# Patient Record
Sex: Male | Born: 1980 | Race: Black or African American | Hispanic: No | Marital: Single | Smoking: Current every day smoker
Health system: Southern US, Community
[De-identification: ages and names within clinical notes are randomized; demographics above are authoritative.]

## PROBLEM LIST (undated history)

## (undated) DIAGNOSIS — F1010 Alcohol abuse, uncomplicated: Secondary | ICD-10-CM

## (undated) DIAGNOSIS — Z720 Tobacco use: Secondary | ICD-10-CM

## (undated) DIAGNOSIS — I10 Essential (primary) hypertension: Secondary | ICD-10-CM

## (undated) HISTORY — DX: Tobacco use: Z72.0

## (undated) HISTORY — DX: Alcohol abuse, uncomplicated: F10.10

---

## 2004-08-10 ENCOUNTER — Ambulatory Visit: Payer: Self-pay | Admitting: Internal Medicine

## 2006-09-12 ENCOUNTER — Emergency Department (HOSPITAL_COMMUNITY): Admission: EM | Admit: 2006-09-12 | Discharge: 2006-09-13 | Payer: Self-pay | Admitting: Emergency Medicine

## 2007-05-09 ENCOUNTER — Emergency Department (HOSPITAL_COMMUNITY): Admission: EM | Admit: 2007-05-09 | Discharge: 2007-05-09 | Payer: Self-pay | Admitting: Family Medicine

## 2008-06-28 ENCOUNTER — Emergency Department (HOSPITAL_COMMUNITY): Admission: EM | Admit: 2008-06-28 | Discharge: 2008-06-28 | Payer: Self-pay | Admitting: Family Medicine

## 2009-01-05 ENCOUNTER — Emergency Department (HOSPITAL_COMMUNITY): Admission: EM | Admit: 2009-01-05 | Discharge: 2009-01-05 | Payer: Self-pay | Admitting: Family Medicine

## 2009-05-01 ENCOUNTER — Emergency Department (HOSPITAL_COMMUNITY): Admission: EM | Admit: 2009-05-01 | Discharge: 2009-05-01 | Payer: Self-pay | Admitting: Emergency Medicine

## 2011-03-20 LAB — CULTURE, ROUTINE-ABSCESS: Culture: NO GROWTH

## 2014-02-07 ENCOUNTER — Encounter (HOSPITAL_COMMUNITY): Payer: Self-pay | Admitting: Emergency Medicine

## 2014-02-07 ENCOUNTER — Emergency Department (HOSPITAL_COMMUNITY)
Admission: EM | Admit: 2014-02-07 | Discharge: 2014-02-07 | Disposition: A | Discharge status: Home / Self Care | Payer: Self-pay | Attending: Emergency Medicine | Admitting: Emergency Medicine

## 2014-02-07 ENCOUNTER — Emergency Department (HOSPITAL_COMMUNITY): Payer: Self-pay

## 2014-02-07 DIAGNOSIS — Y9389 Activity, other specified: Secondary | ICD-10-CM | POA: Insufficient documentation

## 2014-02-07 DIAGNOSIS — X503XXA Overexertion from repetitive movements, initial encounter: Secondary | ICD-10-CM | POA: Insufficient documentation

## 2014-02-07 DIAGNOSIS — S59919A Unspecified injury of unspecified forearm, initial encounter: Secondary | ICD-10-CM

## 2014-02-07 DIAGNOSIS — S63502A Unspecified sprain of left wrist, initial encounter: Secondary | ICD-10-CM

## 2014-02-07 DIAGNOSIS — F17200 Nicotine dependence, unspecified, uncomplicated: Secondary | ICD-10-CM | POA: Insufficient documentation

## 2014-02-07 DIAGNOSIS — I10 Essential (primary) hypertension: Secondary | ICD-10-CM | POA: Insufficient documentation

## 2014-02-07 DIAGNOSIS — S63509A Unspecified sprain of unspecified wrist, initial encounter: Secondary | ICD-10-CM | POA: Insufficient documentation

## 2014-02-07 DIAGNOSIS — S59909A Unspecified injury of unspecified elbow, initial encounter: Secondary | ICD-10-CM | POA: Insufficient documentation

## 2014-02-07 DIAGNOSIS — Y9289 Other specified places as the place of occurrence of the external cause: Secondary | ICD-10-CM | POA: Insufficient documentation

## 2014-02-07 DIAGNOSIS — S6990XA Unspecified injury of unspecified wrist, hand and finger(s), initial encounter: Secondary | ICD-10-CM

## 2014-02-07 HISTORY — DX: Essential (primary) hypertension: I10

## 2014-02-07 MED ORDER — IBUPROFEN 800 MG PO TABS: 800.0000 mg | ORAL_TABLET | Freq: Three times a day (TID) | ORAL | Status: DC

## 2014-02-07 MED ORDER — IBUPROFEN 400 MG PO TABS
800.0000 mg | ORAL_TABLET | Freq: Once | ORAL | Status: AC
  Administered 2014-02-07: 800 mg via ORAL
  Filled 2014-02-07: qty 2

## 2014-02-07 NOTE — ED Provider Notes (Signed)
CSN: [id number]     Arrival date & time 02/07/14  1249 History  This chart was scribed for Johnnette Gourd, PA-C, working with Vanetta Mulders, MD by Chestine Spore, ED Scribe. The patient was seen in room TR08C/TR08C at 1:35 PM.   Chief Complaint  Patient presents with  . Wrist Pain      The history is provided by the patient. No language interpreter was used.   HPI Comments: Edwin [name] is a 33 y.o. male with a medical hx of HTN who presents to the Emergency Department complaining of wrist pain onset yesterday. He rates the pain as a 6-7/10. He was working in the yard and lifting a trash-can when the pain came. He does not think that he heard or felt a pop in the wrist. He is having associated symptoms of joint swelling. He has not tried taking any medications for his symptoms. He denies any other associated symptoms. He voices concern for work since his job requires him to do a lot of lifting.   Past Medical History  Diagnosis Date  . Hypertension    History reviewed. No pertinent past surgical history. History reviewed. No pertinent family history. History  Substance Use Topics  . Smoking status: Current Every Day Smoker -- 1.00 packs/day  . Smokeless tobacco: Never Used  . Alcohol Use: Yes     Comment: 2 40oz every day    Review of Systems  Musculoskeletal: Positive for arthralgias (left wrist pain) and joint swelling (left wrist).    A complete 10 system review of systems was obtained and all systems are negative except as noted in the HPI and PMH.    Allergies  Review of patient's allergies indicates no known allergies.  Home Medications   Prior to Admission medications   Medication Sig Start Date End Date Taking? Authorizing Provider  ibuprofen (ADVIL,MOTRIN) 800 MG tablet Take 1 tablet (800 mg total) by mouth 3 (three) times daily. 02/07/14   Trevor Mace, PA-C   BP 145/89  Pulse 81  Temp(Src) 98.6 F (37 C) (Oral)  Resp 15  SpO2 98%  Physical Exam   Nursing note and vitals reviewed. Constitutional: He is oriented to person, place, and time. He appears well-developed and well-nourished. No distress.  HENT:  Head: Normocephalic and atraumatic.  Eyes: Conjunctivae and EOM are normal.  Neck: Normal range of motion. Neck supple.  Cardiovascular: Normal rate, regular rhythm and normal heart sounds.   Pulses:      Radial pulses are 2+ on the left side.  Pulmonary/Chest: Effort normal and breath sounds normal.  Musculoskeletal: Normal range of motion. He exhibits tenderness. He exhibits no edema.  Tender to palpation over distal radius and ulna with mild swelling, no deformity. ROM limited due to pain  Neurological: He is alert and oriented to person, place, and time.  Skin: Skin is warm and dry.  Psychiatric: He has a normal mood and affect. His behavior is normal.    ED Course  Procedures (including critical care time)  SPLINT APPLICATION Date/Time: 3:29 PM Authorized by: Johnnette Gourd Consent: Verbal consent obtained. Risks and benefits: risks, benefits and alternatives were discussed Consent given by: patient Splint applied by: nurse Location details: left wrist Splint type: velcro Supplies used: velcro splint Post-procedure: The splinted body part was neurovascularly unchanged following the procedure. Patient tolerance: Patient tolerated the procedure well with no immediate complications.    DIAGNOSTIC STUDIES: Oxygen Saturation is 98% on room air, normal by my interpretation.  COORDINATION OF CARE: 1:37 PM-Discussed treatment plan which includes IBU and X-ray of the left wrist with pt at bedside and pt agreed to plan.   Labs Review Labs Reviewed - No data to display  Imaging Review Dg Wrist Complete Left  02/07/2014   CLINICAL DATA:  Left wrist pain following injury  EXAM: LEFT WRIST - COMPLETE 3+ VIEW  COMPARISON:  None.  FINDINGS: There is no evidence of fracture or dislocation. There is no evidence of  arthropathy or other focal bone abnormality. Soft tissues are unremarkable.  IMPRESSION: No acute abnormality noted.   Electronically Signed   By: Alcide Clever M.D.   On: 02/07/2014 15:21     EKG Interpretation None      MDM   Final diagnoses:  Left wrist sprain, initial encounter    Patient with right wrist pain after lifting a trash can. Vital signs stable. Neurovascularly intact. X-ray without any acute finding. Velcro wrist splint applied. Discussed RICE, NSAIDs. Stable for discharge. Return precautions given. Patient states understanding of treatment care plan and is agreeable.  I personally performed the services described in this documentation, which was scribed in my presence. The recorded information has been reviewed and is accurate.    Trevor Mace, PA-C 02/07/14 1530

## 2014-02-07 NOTE — ED Notes (Signed)
Ortho at bedside.

## 2014-02-07 NOTE — Progress Notes (Signed)
Orthopedic Tech Progress Note Patient Details:  Edwin [name] 12-19-1980 [id number]  Ortho Devices Type of Ortho Device: Velcro wrist splint Ortho Device/Splint Location: LUE Ortho Device/Splint Interventions: Application   Asia R Thompson 02/07/2014, 3:38 PM

## 2014-02-07 NOTE — Discharge Instructions (Signed)
Take ibuprofen as directed for pain.  Wrist Pain Wrist injuries are frequent in adults and children. A sprain is an injury to the ligaments that hold your bones together. A strain is an injury to muscle or muscle cord-like structures (tendons) from stretching or pulling. Generally, when wrists are moderately tender to touch following a fall or injury, a break in the bone (fracture) may be present. Most wrist sprains or strains are better in 3 to 5 days, but complete healing may take several weeks. HOME CARE INSTRUCTIONS   Put ice on the injured area.  Put ice in a plastic bag.  Place a towel between your skin and the bag.  Leave the ice on for 15-20 minutes, 3-4 times a day, for the first 2 days, or as directed by your health care provider.  Keep your arm raised above the level of your heart whenever possible to reduce swelling and pain.  Rest the injured area for at least 48 hours or as directed by your health care provider.  If a splint or elastic bandage has been applied, use it for as long as directed by your health care provider or until seen by a health care provider for a follow-up exam.  Only take over-the-counter or prescription medicines for pain, discomfort, or fever as directed by your health care provider.  Keep all follow-up appointments. You may need to follow up with a specialist or have follow-up X-rays. Improvement in pain level is not a guarantee that you did not fracture a bone in your wrist. The only way to determine whether or not you have a broken bone is by X-ray. SEEK IMMEDIATE MEDICAL CARE IF:   Your fingers are swollen, very red, white, or cold and blue.  Your fingers are numb or tingling.  You have increasing pain.  You have difficulty moving your fingers. MAKE SURE YOU:   Understand these instructions.  Will watch your condition.  Will get help right away if you are not doing well or get worse. Document Released: 03/07/2005 Document Revised:  06/02/2013 Document Reviewed: 07/19/2010 [facility] Patient Information 2015 [place], [place]. This information is not intended to replace advice given to you by your health care provider. Make sure you discuss any questions you have with your health care provider.  Sprain A sprain is a tear in one of the strong, fibrous tissues that connect your bones (ligaments). The severity of the sprain depends on how much of the ligament is torn. The tear can be either partial or complete. CAUSES  Often, sprains are a result of a fall or an injury. The force of the impact causes the fibers of your ligament to stretch beyond their normal length. This excess tension causes the fibers of your ligament to tear. SYMPTOMS  You may have some loss of motion or increased pain within your normal range of motion. Other symptoms include:  Bruising.  Tenderness.  Swelling. DIAGNOSIS  In order to diagnose a sprain, your caregiver will physically examine you to determine how torn the ligament is. Your caregiver may also suggest an X-ray exam to make sure no bones are broken. TREATMENT  If your ligament is only partially torn, treatment usually involves keeping the injured area in a fixed position (immobilization) for a short period. To do this, your caregiver will apply a bandage, cast, or splint to keep the area from moving until it heals. For a partially torn ligament, the healing process usually takes 2 to 3 weeks. If your ligament is  completely torn, you may need surgery to reconnect the ligament to the bone or to reconstruct the ligament. After surgery, a cast or splint may be applied and will need to stay on for 4 to 6 weeks while your ligament heals. HOME CARE INSTRUCTIONS  Keep the injured area elevated to decrease swelling.  To ease pain and swelling, apply ice to your joint twice a day, for 2 to 3 days.  Put ice in a plastic bag.  Place a towel between your skin and the bag.  Leave the ice on for 15  minutes.  Only take over-the-counter or prescription medicine for pain as directed by your caregiver.  Do not leave the injured area unprotected until pain and stiffness go away (usually 3 to 4 weeks).  Do not allow your cast or splint to get wet. Cover your cast or splint with a plastic bag when you shower or bathe. Do not swim.  Your caregiver may suggest exercises for you to do during your recovery to prevent or limit permanent stiffness. SEEK IMMEDIATE MEDICAL CARE IF:  Your cast or splint becomes damaged.  Your pain becomes worse. MAKE SURE YOU:  Understand these instructions.  Will watch your condition.  Will get help right away if you are not doing well or get worse. Document Released: 05/25/2000 Document Revised: 08/20/2011 Document Reviewed: 06/09/2011 [facility] Patient Information 2015 [place], [place]. This information is not intended to replace advice given to you by your health care provider. Make sure you discuss any questions you have with your health care provider. RICE: Routine Care for Injuries The routine care of many injuries includes Rest, Ice, Compression, and Elevation (RICE). HOME CARE INSTRUCTIONS  Rest is needed to allow your body to heal. Routine activities can usually be resumed when comfortable. Injured tendons and bones can take up to 6 weeks to heal. Tendons are the cord-like structures that attach muscle to bone.  Ice following an injury helps keep the swelling down and reduces pain.  Put ice in a plastic bag.  Place a towel between your skin and the bag.  Leave the ice on for 15-20 minutes, 3-4 times a day, or as directed by your health care provider. Do this while awake, for the first 24 to 48 hours. After that, continue as directed by your caregiver.  Compression helps keep swelling down. It also gives support and helps with discomfort. If an elastic bandage has been applied, it should be removed and reapplied every 3 to 4 hours. It should not be  applied tightly, but firmly enough to keep swelling down. Watch fingers or toes for swelling, bluish discoloration, coldness, numbness, or excessive pain. If any of these problems occur, remove the bandage and reapply loosely. Contact your caregiver if these problems continue.  Elevation helps reduce swelling and decreases pain. With extremities, such as the arms, hands, legs, and feet, the injured area should be placed near or above the level of the heart, if possible. SEEK IMMEDIATE MEDICAL CARE IF:  You have persistent pain and swelling.  You develop redness, numbness, or unexpected weakness.  Your symptoms are getting worse rather than improving after several days. These symptoms may indicate that further evaluation or further X-rays are needed. Sometimes, X-rays may not show a small broken bone (fracture) until 1 week or 10 days later. Make a follow-up appointment with your caregiver. Ask when your X-ray results will be ready. Make sure you get your X-ray results. Document Released: 09/09/2000 Document Revised: 06/02/2013 Document Reviewed:  10/27/2010 [employer] Patient Information 2015 [place], [place]. This information is not intended to replace advice given to you by your health care provider. Make sure you discuss any questions you have with your health care provider.

## 2014-02-07 NOTE — ED Notes (Signed)
Ortho contacted for splinting.

## 2014-02-07 NOTE — ED Notes (Signed)
Pt states he thinks that he sprained his wrist yesterday while working in the yard. Pt denies hearing or feeling a pop. Pulses, movement, and cap refill intact. No numbness and tingling. Pt worried about being able to work since job calls for lots of lifting.

## 2014-02-08 NOTE — ED Provider Notes (Signed)
Medical screening examination/treatment/procedure(s) were performed by non-physician practitioner and as supervising physician I was immediately available for consultation/collaboration.   EKG Interpretation None        Kaitelyn Jamison, MD 02/08/14 1125 

## 2014-09-17 ENCOUNTER — Emergency Department (HOSPITAL_COMMUNITY)
Admission: EM | Admit: 2014-09-17 | Discharge: 2014-09-17 | Disposition: A | Discharge status: Home / Self Care | Payer: 59 | Source: Home / Self Care | Attending: Emergency Medicine | Admitting: Emergency Medicine

## 2014-09-17 ENCOUNTER — Encounter (HOSPITAL_COMMUNITY): Payer: Self-pay | Admitting: Emergency Medicine

## 2014-09-17 DIAGNOSIS — I10 Essential (primary) hypertension: Secondary | ICD-10-CM

## 2014-09-17 DIAGNOSIS — J111 Influenza due to unidentified influenza virus with other respiratory manifestations: Secondary | ICD-10-CM

## 2014-09-17 MED ORDER — AMLODIPINE BESYLATE 5 MG PO TABS: 5.0000 mg | ORAL_TABLET | Freq: Every day | ORAL | Status: DC

## 2014-09-17 MED ORDER — OSELTAMIVIR PHOSPHATE 75 MG PO CAPS: 75.0000 mg | ORAL_CAPSULE | Freq: Two times a day (BID) | ORAL | Status: DC

## 2014-09-17 NOTE — Discharge Instructions (Signed)
You have the flu. Drink plenty of fluids. Take Tylenol or ibuprofen as needed for fevers and body aches. Take Tamiflu 1 pill twice a day for the next 5 days.  Your blood pressure is quite elevated. I've given you a prescription for amlodipine. Take 1 pill daily. [facility] one [facility] Ideally, your blood pressure is less than 140/90.  Follow-up as needed.

## 2014-09-17 NOTE — ED Provider Notes (Signed)
CSN: [id number]     Arrival date & time 09/17/14  1543 History   First MD Initiated Contact with Patient 09/17/14 1730     Chief Complaint  Patient presents with  . Influenza   (Consider location/radiation/quality/duration/timing/severity/associated sxs/prior Treatment) HPI He is a 34 year old man here for evaluation of flulike symptoms. He states his symptoms started yesterday with generalized weakness, body aches, runny nose, sore throat, cough. His symptoms have worsened today. He does have a fever today. He reports some epigastric and sternal chest pain with coughing only. He denies any shortness of breath. No nausea or vomiting. He states his coworkers have been sick with the flu.  He states he does have high blood pressure, but has not taken any medication in 2-3 years. He used to be on, he thinks, lisinopril. He states it made him sick.  Past Medical History  Diagnosis Date  . Hypertension    History reviewed. No pertinent past surgical history. No family history on file. History  Substance Use Topics  . Smoking status: Current Every Day Smoker -- 1.00 packs/day  . Smokeless tobacco: Never Used  . Alcohol Use: Yes     Comment: 2 40oz every day    Review of Systems  Constitutional: Positive for fever and appetite change.  HENT: Positive for congestion, rhinorrhea and sore throat. Negative for ear pain and trouble swallowing.   Respiratory: Positive for cough. Negative for shortness of breath.   Cardiovascular: Positive for chest pain (with cough).  Gastrointestinal: Negative for nausea and vomiting.  Musculoskeletal: Positive for myalgias.  Neurological: Negative for headaches.    Allergies  Review of patient's allergies indicates no known allergies.  Home Medications   Prior to Admission medications   Medication Sig Start Date End Date Taking? Authorizing Provider  amLODipine (NORVASC) 5 MG tablet Take 1 tablet (5 mg total) by mouth daily. 09/17/14   Charm RingsErin J Kardell Virgil, MD   ibuprofen (ADVIL,MOTRIN) 800 MG tablet Take 1 tablet (800 mg total) by mouth 3 (three) times daily. 02/07/14   Kathrynn Speedobyn M Hess, PA-C  oseltamivir (TAMIFLU) 75 MG capsule Take 1 capsule (75 mg total) by mouth every 12 (twelve) hours. 09/17/14   Charm RingsErin J Filmore Molyneux, MD   BP 184/98 mmHg  Pulse 95  Temp(Src) 100.6 F (38.1 C) (Oral)  Resp 18  SpO2 96% Physical Exam  Constitutional: He is oriented to person, place, and time. He appears well-developed and well-nourished. No distress.  HENT:  Head: Normocephalic and atraumatic.  Right Ear: Tympanic membrane normal.  Left Ear: Tympanic membrane normal.  Nose: Rhinorrhea present.  Mouth/Throat: Oropharynx is clear and moist. No oropharyngeal exudate.  Eyes: Conjunctivae are normal.  Neck: Neck supple.  Cardiovascular: Normal rate, regular rhythm and normal heart sounds.   No murmur heard. Pulmonary/Chest: Effort normal and breath sounds normal. No respiratory distress. He has no wheezes. He has no rales. He exhibits tenderness (sternal).  Lymphadenopathy:    He has no cervical adenopathy.  Neurological: He is alert and oriented to person, place, and time.  Vitals reviewed.   ED Course  Procedures (including critical care time) Labs Review Labs Reviewed - No data to display  Imaging Review No results found.   MDM   1. Influenza   2. Essential hypertension    Tamiflu. Discussed fluids. Tylenol and Motrin as needed for fever and body aches.  Start amlodipine 5 mg daily for blood pressure. [facility]  Follow-up as needed.  Charm Rings, MD 09/17/14 [phone]

## 2014-09-17 NOTE — ED Notes (Signed)
Patient c/o flu-like symptoms including cough, fever, weakness and chills onset yesterday. States he took an OTC cough syrup. 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 Patient is in NAD.

## 2015-06-16 ENCOUNTER — Emergency Department (HOSPITAL_COMMUNITY): Payer: Self-pay

## 2015-06-16 ENCOUNTER — Emergency Department (HOSPITAL_COMMUNITY)
Admission: EM | Admit: 2015-06-16 | Discharge: 2015-06-16 | Disposition: A | Discharge status: Home / Self Care | Payer: Self-pay | Attending: Emergency Medicine | Admitting: Emergency Medicine

## 2015-06-16 ENCOUNTER — Encounter (HOSPITAL_COMMUNITY): Payer: Self-pay | Admitting: *Deleted

## 2015-06-16 DIAGNOSIS — I10 Essential (primary) hypertension: Secondary | ICD-10-CM | POA: Insufficient documentation

## 2015-06-16 DIAGNOSIS — F17200 Nicotine dependence, unspecified, uncomplicated: Secondary | ICD-10-CM | POA: Insufficient documentation

## 2015-06-16 DIAGNOSIS — K002 Abnormalities of size and form of teeth: Secondary | ICD-10-CM | POA: Insufficient documentation

## 2015-06-16 DIAGNOSIS — Z791 Long term (current) use of non-steroidal anti-inflammatories (NSAID): Secondary | ICD-10-CM | POA: Insufficient documentation

## 2015-06-16 DIAGNOSIS — K047 Periapical abscess without sinus: Secondary | ICD-10-CM | POA: Insufficient documentation

## 2015-06-16 DIAGNOSIS — R1012 Left upper quadrant pain: Secondary | ICD-10-CM | POA: Insufficient documentation

## 2015-06-16 DIAGNOSIS — R6884 Jaw pain: Secondary | ICD-10-CM | POA: Insufficient documentation

## 2015-06-16 DIAGNOSIS — Z79899 Other long term (current) drug therapy: Secondary | ICD-10-CM | POA: Insufficient documentation

## 2015-06-16 LAB — CBC
HCT: 38.2 % — ABNORMAL LOW (ref 39.0–52.0)
HEMOGLOBIN: 12.6 g/dL — AB (ref 13.0–17.0)
MCH: 31.3 pg (ref 26.0–34.0)
MCHC: 33 g/dL (ref 30.0–36.0)
MCV: 95 fL (ref 78.0–100.0)
PLATELETS: 212 10*3/uL (ref 150–400)
RBC: 4.02 MIL/uL — AB (ref 4.22–5.81)
RDW: 13.5 % (ref 11.5–15.5)
WBC: 12.4 10*3/uL — AB (ref 4.0–10.5)

## 2015-06-16 LAB — URINALYSIS, ROUTINE W REFLEX MICROSCOPIC
Bilirubin Urine: NEGATIVE
Glucose, UA: NEGATIVE mg/dL
HGB URINE DIPSTICK: NEGATIVE
Ketones, ur: 40 mg/dL — AB
LEUKOCYTES UA: NEGATIVE
NITRITE: NEGATIVE
PROTEIN: NEGATIVE mg/dL
SPECIFIC GRAVITY, URINE: 1.018 (ref 1.005–1.030)
pH: 6.5 (ref 5.0–8.0)

## 2015-06-16 LAB — COMPREHENSIVE METABOLIC PANEL
ALK PHOS: 38 U/L (ref 38–126)
ALT: 21 U/L (ref 17–63)
ANION GAP: 10 (ref 5–15)
AST: 19 U/L (ref 15–41)
Albumin: 4 g/dL (ref 3.5–5.0)
BUN: 7 mg/dL (ref 6–20)
CALCIUM: 9.1 mg/dL (ref 8.9–10.3)
CO2: 27 mmol/L (ref 22–32)
CREATININE: 0.64 mg/dL (ref 0.61–1.24)
Chloride: 104 mmol/L (ref 101–111)
Glucose, Bld: 90 mg/dL (ref 65–99)
Potassium: 3.8 mmol/L (ref 3.5–5.1)
SODIUM: 141 mmol/L (ref 135–145)
TOTAL PROTEIN: 6.7 g/dL (ref 6.5–8.1)
Total Bilirubin: 0.7 mg/dL (ref 0.3–1.2)

## 2015-06-16 LAB — LIPASE, BLOOD: Lipase: 27 U/L (ref 11–51)

## 2015-06-16 MED ORDER — IOHEXOL 300 MG/ML  SOLN
75.0000 mL | Freq: Once | INTRAMUSCULAR | Status: AC | PRN
  Administered 2015-06-16: 75 mL via INTRAVENOUS

## 2015-06-16 MED ORDER — GI COCKTAIL ~~LOC~~
30.0000 mL | Freq: Once | ORAL | Status: AC
  Administered 2015-06-16: 30 mL via ORAL
  Filled 2015-06-16: qty 30

## 2015-06-16 MED ORDER — ONDANSETRON 4 MG PO TBDP
4.0000 mg | ORAL_TABLET | Freq: Once | ORAL | Status: AC
  Administered 2015-06-16: 4 mg via ORAL
  Filled 2015-06-16: qty 1

## 2015-06-16 MED ORDER — PENICILLIN V POTASSIUM 500 MG PO TABS: 500.0000 mg | ORAL_TABLET | Freq: Four times a day (QID) | ORAL | Status: AC

## 2015-06-16 MED ORDER — OXYCODONE-ACETAMINOPHEN 5-325 MG PO TABS
1.0000 | ORAL_TABLET | Freq: Once | ORAL | Status: AC
  Administered 2015-06-16: 1 via ORAL
  Filled 2015-06-16: qty 1

## 2015-06-16 MED ORDER — ACETAMINOPHEN 325 MG PO TABS: 650.0000 mg | ORAL_TABLET | Freq: Four times a day (QID) | ORAL | Status: DC | PRN

## 2015-06-16 MED ORDER — DICYCLOMINE HCL 20 MG PO TABS: 20.0000 mg | ORAL_TABLET | Freq: Two times a day (BID) | ORAL | Status: DC | PRN

## 2015-06-16 NOTE — ED Notes (Signed)
Pt states LUQ abdominal pain for several weeks and L jaw pain that increases when he eats something solid. Lymph nodes swollen.  Dental carries noted. Pt states he drinks a fifth of whiskey and several 40's per day.  Denies nausea, vomiting.

## 2015-06-16 NOTE — Discharge Instructions (Signed)
You were seen in the emergency room today for evaluation of dental pain/jaw swelling and abdominal pain. Your abdominal labs and exam were normal. Your symptoms might be due to acid reflux or due to your alcohol intake. As we discussed, I highly suggest trying to cut back on your alcohol consumption. I will give you a list of resources to help you with this for when you are ready.  We also got a CT scan of your face given your dental pain. You do have evidence of a tooth infection but no deep abscess. I will give you a prescription for Penicillin, and antibiotic. Please take as prescribed and follow up with a dentist from the list below within one week. I also gave you a prescription for tylenol for your pain. You may take ibuprofen (Advil/Motrin) in addition to the Tylenol, but ibuprofen can make abdominal pain worse.   Emergency Department Resource Guide 1) Find a Doctor and Pay Out of Pocket Although you won't have to find out who is covered by your insurance plan, it is a good idea to ask around and get recommendations. You will then need to call the office and see if the doctor you have chosen will accept you as a new patient and what types of options they offer for patients who are self-pay. Some doctors offer discounts or will set up payment plans for their patients who do not have insurance, but you will need to ask so you aren't surprised when you get to your appointment.  2) Contact Your Local Health Department Not all health departments have doctors that can see patients for sick visits, but many do, so it is worth a call to see if yours does. If you don't know where your local health department is, you can check in your phone book. The CDC also has a tool to help you locate your state's health department, and many state websites also have listings of all of their local health departments.  3) Find a Walk-in Clinic If your illness is not likely to be very severe or complicated, you may want  to try a walk in clinic. These are popping up all over the country in pharmacies, drugstores, and shopping centers. They're usually staffed by nurse practitioners or physician assistants that have been trained to treat common illnesses and complaints. They're usually fairly quick and inexpensive. However, if you have serious medical issues or chronic medical problems, these are probably not your best option.  No Primary Care Doctor: - Call Health Connect at  [phone] - they can help you locate a primary care doctor that  accepts your insurance, provides certain services, etc. - Physician Referral Service- [phone]  Chronic Pain Problems: Organization         Address  Phone   Notes  [facility]  [phone] Patients need to be referred by their primary care doctor.   Medication Assistance: Organization         Address  Phone   Notes  [facility] Ptrssistance Program [address] [place], [place] [phone]) [phone] --Must be a resident of [place] -- Must have NO insurance coverage whatsoever (no Medicaid/ Medicare, etc.) -- The pt. MUST have a primary care doctor that directs their care regularly and follows them in the community   Med[facility]ist  [phone]   [employer]  [phone]    Agencies that provide inexpensive medical care: Organization  Address  Phone   Notes  [place]  [phone]   [facility]    [phone]   [facility] [place], [place] [address] [phone]   [place] [address] [place], [place] [phone]   Planned Parenthood    [phone]   [facility]    [phone]   [place] and [place] [address], [place] Phone:  [phone], Fax:  [phone] Hours of Operation:  9 am - 6 pm, M-F.  Also accepts Medicaid/Medicare and self-pay.   [facility] for [place], [address], [place] Phone: [phone], Fax: [phone]. Hours of Operation:  8:30 am - 5:30 pm, M-F.  Also accepts Medicaid and self-pay.  [facility] [place] [address] [place] Phone: [phone]   [place], [place], [place] [phone], Ext. [phone] Mondays & Thursdays: 7-9 AM.  First 15 patients are seen on a first come, first serve basis.    [place] Providers:  Organization         Address  Phone   Notes  [facility] [address], [place] [phone] Also accepts self-pay patients.  [facility] [address], [place]  [phone]   [place], [address], [place] [phone]   [facility] [address], [place] [phone]   Lucianne Lei [address], [place]   [phone] Only accepts [place] Access [place] patients after they have their name applied to their card.   Self-Pay (no insurance) in [facility]:  Organization         Address  Phone   Notes  Sickle Cell Patients, [facility] [place] [phone]   [facility] [place] [phone]   [facility] [place]  [place], [place], [place] [phone]   [facility]/Dr. Osei-Bonsu  [address] [place] or [address], [place] [phone] Phone number for both [place] and [place] locations is the same.  Urgent Medical and [facility] [address], [place] [phone]   [facility] [address], [place] or [address] [phone]   [facility] [address] [place] [phone], phone; [phone], fax Sees patients 1st and 3rd Saturday of every month.  Must not qualify for public or private insurance (i.e. Medicaid, Medicare, [place] Health Choice, Veterans' Benefits)  Household income should be no more than 200% of the poverty level The clinic cannot treat you if you are pregnant or think you are pregnant  Sexually transmitted diseases are not treated at the clinic.    Dental Care: Organization         Address  Phone  Notes  [facility] Department of [facility] [place] [phone] Accepts children up to age 52 who are enrolled in [place] or [place]; pregnant women with a Medicaid card; and children who have applied for Medicaid or [place] Health Choice, but were declined, whose parents can pay a reduced fee at time  of service.  [facility] Department of [facility]  [address], [place] [phone] Accepts children up to age 31 who are enrolled in [place] or [place]; pregnant women with a Medicaid card; and children who have applied for Medicaid or [place] Health Choice, but were declined, whose parents can pay a reduced fee at time of service.  [place] Adult Dental Access PROGRAM  [place] [phone] Patients are seen by appointment only. Walk-ins are not accepted. [place] will see patients 86 years of age and older. Monday - Tuesday (8am-5pm) Most Wednesdays (8:30-5pm) $30 per visit, cash only  [facility] Adult Dental Access PROGRAM  [address], [facility] [phone] Patients are seen by appointment only. Walk-ins are not accepted. [place] will see patients 19 years of age and older. One Wednesday Evening (Monthly: Volunteer Based).  $30 per visit, cash only  [place]  [phone] for adults; Children under age 25, call Graduate Pediatric Dentistry at [phone]. Children aged 61-14, please call [phone] to request a pediatric application.  Dental services are provided in all areas of dental care including fillings, crowns and bridges, complete and partial dentures, implants, gum treatment, root canals, and extractions. Preventive care is also provided. Treatment is provided to both adults and children. Patients are selected via a lottery and there is often a waiting list.   [facility] [address], [place]  [phone] [email or web address]   [facility] [address] [place], [place] [phone], Ext. [phone] Second and Fourth Thursday of each month, opens at 6:30 AM; Clinic ends at 9 AM.  Patients are seen on a first-come first-served basis, and a limited number are seen during each clinic.   [facility]  [address] Hillard [employer], [place] [phone]   Eligibility Requirements You must have lived in [place], [place], or [place] counties for at least the last three months.   You cannot be eligible for state or federal sponsored [employer], including [employer], [place], or [employer].   You generally cannot be eligible for healthcare insurance through your employer.    How to apply: Eligibility screenings are held every Tuesday and Wednesday afternoon from 1:00 pm until 4:00 pm. You do not need an appointment for the interview!  [facility] [address], [place], [place]   [place]  [place] Department  [place]  [phone]    Behavioral Health Resources in the Community: Intensive Outpatient Programs Organization         Address  Phone  Notes  [place] [place], [place] [phone]   [facility] Outpatient [address] [place], [place]   ADS: Alcohol & Drug Svcs [address], [place], [place]    [place] [address],  [place], [place] or [phone]   Substance Abuse Resources Organization         Address  Phone  Notes  Alcohol and Drug Services  [phone]   [place]  [phone]   [place]   Chinita Pester  [phone]   Residential & Outpatient Substance Abuse Program  [phone]   Psychological Services Organization         Address  Phone  Notes  [facility]  [place]  [phone]   [place] [address] [place] or [phone]    Mobile Crisis Teams Organization         Address  Phone  Notes  [facility], Mobile Crisis Care Unit  [phone]   [facility]  [address] [place], [place]   [place] Levels [address], [place] [phone]    Self-Help/Support Groups Organization         Address  Phone             Notes  [place]. of [place] - variety of support groups  [place] Call for more information  Narcotics Anonymous (NA), [facility] [address], [employer] [place]  2 meetings at this location   Special educational needs teacher         Address  Phone  Notes  ASAP Residential Treatment [employer],    [place]  [phone]   [facility]  [address] [place] [address]2863, [place], [place]   [place], [place] [phone] Admissions: 8am-3pm M-F  Incentives Substance [place] [address]    [place], [place] [phone]   [facility] [address] [place], [place], [place]   [facility] [address]  [place], [place]   [facility] Programs - Intensive Outpatient [address], [place], [place]   [facility] ([place].,  [place], [place] [phone] or [phone]   [facility] Services (RTS) [address] [place], [place] Accepts Medicaid  [facility] [place] [address]  [place] [phone] Substance Abuse/Addiction Treatment   [facility] Organization         Address  Phone  Notes  [facility]  [phone]   Domenic Schwab, PhD [address] Arlis Porta [place], [place]   [phone] or [phone]   Kinsley Carpenter [place], [place] [phone]   [facility] [address] [place], [place] [phone] Insurance/Medicaid/sponsorship through [facility] and Families [address], [address] [place] [address]                                    [place], [place] [phone] [place] [address], [place] [phone]    Dr. Adele Schilder  [phone]   [facility] Dept. 1) [address] [place] 2) [place] 3)  [place] [address], [place] [phone]  [phone]   [place] [phone] or [phone] (After Hours)

## 2015-06-16 NOTE — ED Notes (Signed)
Patient verbalized understanding of discharge instructions and denies any further needs or questions at this time. VS stable. Patient ambulatory with steady gait.  

## 2015-06-16 NOTE — ED Notes (Signed)
MD at bedside. 

## 2015-06-16 NOTE — ED Provider Notes (Signed)
CSN: [id number]     Arrival date & time 06/16/15  [id number] History   First MD Initiated Contact with Patient 06/16/15 1519     Chief Complaint  Patient presents with  . Abdominal Pain  . Dental Pain   HPI   Edwin [name] is an 35 y.o. male with history of uncontrolled HTN who presents to the ED for evaluation of abdominal pain and dental pain. He states his abdominal pain started 2 weeks ago. Describes it as 7/10 constant pain in one specific spot in his LUQ. Denies associated symptoms. Denies N/V/D. Denies fever at home. He has not tried anything to help with his symptoms. He states he is worried it is due to his drinking. He admits to drinking 1 pint to 1 fifth of liquor plus several 40 oz beers daily. Endorses biweekly marijuana use. Denies other drugs. Denies chest pain or SOB. Pt's BP elevated in the ED. He states he does not take his BP meds as they affect his sex life.   Pt is also complaining of lower left dental and jaw pain that began two days ago. He states he first started noticing pain with chewing on his lower left side two days ago. He has also since then noticed swelling of his left jaw. He states it hurts to open his mouth. He has not tried anything for the pain. Denies dysphagia, drooling, fever, chills.   Past Medical History  Diagnosis Date  . Hypertension    History reviewed. No pertinent past surgical history. No family history on file. Social History  Substance Use Topics  . Smoking status: Current Every Day Smoker -- 1.00 packs/day  . Smokeless tobacco: Never Used  . Alcohol Use: Yes     Comment: 2 40oz every day    Review of Systems  All other systems reviewed and are negative.     Allergies  Review of patient's allergies indicates no known allergies.  Home Medications   Prior to Admission medications   Medication Sig Start Date End Date Taking? Authorizing Provider  ibuprofen (ADVIL,MOTRIN) 800 MG tablet Take 1 tablet (800 mg total) by mouth 3 (three) times  daily. 02/07/14  Yes Robyn M Hess, PA-C  amLODipine (NORVASC) 5 MG tablet Take 1 tablet (5 mg total) by mouth daily. Patient not taking: Reported on 06/16/2015 09/17/14   Charm RingsErin J Honig, MD  oseltamivir (TAMIFLU) 75 MG capsule Take 1 capsule (75 mg total) by mouth every 12 (twelve) hours. 09/17/14   Charm RingsErin J Honig, MD   BP 156/102 mmHg  Pulse 70  Temp(Src) 98.5 F (36.9 C) (Oral)  Resp 18  Ht 5\' 11"  (1.803 m)  Wt 96.163 kg  BMI 29.58 kg/m2  SpO2 100% Physical Exam  Constitutional: He is oriented to person, place, and time.  HENT:  Right Ear: External ear normal.  Left Ear: External ear normal.  Nose: Nose normal.  Generally poor dentition. Last lower left molar with significant gingival edema and erythema. Gingiva and buccal soft tissue of lower left area markedly TTP. External lower left jaw line also TTP. Some limitation to ROM of jaw, either 2/2 guarding from pain or trimsus. No gross abscess.   Eyes: Conjunctivae and EOM are normal. Pupils are equal, round, and reactive to light.  Neck: Normal range of motion. Neck supple. No rigidity. No Kernig's sign noted.  +L submaxillar lymphadenopathy. TTP.   Cardiovascular: Normal rate, regular rhythm, normal heart sounds and intact distal pulses.   Pulmonary/Chest: Effort normal and breath  sounds normal. No respiratory distress. He has no wheezes. He exhibits no tenderness.  Abdominal: Soft. Bowel sounds are normal. He exhibits no distension. There is no tenderness. There is no rebound, no guarding and no CVA tenderness.  Musculoskeletal: He exhibits no edema.  Neurological: He is alert and oriented to person, place, and time. No cranial nerve deficit.  Skin: Skin is warm and dry.  Psychiatric: He has a normal mood and affect.  Nursing note and vitals reviewed.   ED Course  Procedures (including critical care time) Labs Review Labs Reviewed  CBC - Abnormal; Notable for the following:    WBC 12.4 (*)    RBC 4.02 (*)    Hemoglobin 12.6 (*)     HCT 38.2 (*)    All other components within normal limits  URINALYSIS, ROUTINE W REFLEX MICROSCOPIC (NOT AT [facility]) - Abnormal; Notable for the following:    APPearance CLOUDY (*)    Ketones, ur 40 (*)    All other components within normal limits  LIPASE, BLOOD  COMPREHENSIVE METABOLIC PANEL    Imaging Review Ct Maxillofacial W/cm  06/16/2015  CLINICAL DATA:  35 year old with acute onset of left facial swelling two days ago. EXAM: CT MAXILLOFACIAL WITH CONTRAST TECHNIQUE: Multidetector CT imaging of the maxillofacial structures was performed with intravenous contrast. Multiplanar CT image reconstructions were also generated. CONTRAST:  75mL OMNIPAQUE IOHEXOL 300 MG/ML IV. COMPARISON:  None. FINDINGS: Minimal edema/induration in the subcutaneous fat of the left cheek. No abnormal fluid collection to suggest abscess. Mildly enlarged left level Ib and level IIa lymph nodes, the largest a level Ib (submandibular) node measuring approximately 1.3 x 2.2 x 1.8 cm. Enlarged right level IIa node measuring approximately 1.9 x 1.2 x 2.8 cm. No masses identified within the visualized face and the upper neck. Normal and symmetric bilateral parotid and submandibular salivary glands. Multiple mucous retention cysts and/or polyps in both maxillary sinuses. Remaining paranasal sinuses well aerated. No air-fluid levels. Bony nasal septal deviation to the left. Temporomandibular joints intact. IMPRESSION: 1. Minimal edema/induration in the subcutaneous fat of the left cheek without evidence of abscess. 2. Mildly enlarged left level Ib and bilateral level IIa nodes. 3. Chronic bilateral maxillary sinusitis. Electronically Signed   By: Hulan Saas M.D.   On: 06/16/2015 18:43   I have personally reviewed and evaluated these images and lab results as part of my medical decision-making.   EKG Interpretation None      MDM   Final diagnoses:  Dental infection  Left upper quadrant pain   Spoke to attending Dr.  Clarene Duke who assess pt as well. Given pt's facial tenderness along entire lower left jawline (particularly extending anteriorly away from what appears to be the suspected infected tooth) will get CT maxillofacial to r/o underlying deep abscess or other abnormality.   CT shows some edema and induration of left cheek fat but no e/o abscess. Enlarged lymph nodes also visualized. Will give rx for PCN. Will give tylenol and bentyl for dental and abdominal pain. Resource guide given to establish dental and primary care. ER return precautions given.  Carlene Coria, PA-C 06/16/15 2355  Laurence Spates, MD 06/17/15 [phone]

## 2017-06-03 ENCOUNTER — Other Ambulatory Visit: Payer: Self-pay

## 2017-06-03 ENCOUNTER — Encounter (HOSPITAL_COMMUNITY): Payer: Self-pay

## 2017-06-03 ENCOUNTER — Emergency Department (HOSPITAL_COMMUNITY): Payer: PRIVATE HEALTH INSURANCE

## 2017-06-03 ENCOUNTER — Emergency Department (HOSPITAL_COMMUNITY)
Admission: EM | Admit: 2017-06-03 | Discharge: 2017-06-03 | Disposition: A | Discharge status: Home / Self Care | Payer: PRIVATE HEALTH INSURANCE | Attending: Emergency Medicine | Admitting: Emergency Medicine

## 2017-06-03 DIAGNOSIS — I10 Essential (primary) hypertension: Secondary | ICD-10-CM | POA: Insufficient documentation

## 2017-06-03 DIAGNOSIS — F17210 Nicotine dependence, cigarettes, uncomplicated: Secondary | ICD-10-CM | POA: Insufficient documentation

## 2017-06-03 DIAGNOSIS — R079 Chest pain, unspecified: Secondary | ICD-10-CM | POA: Diagnosis present

## 2017-06-03 DIAGNOSIS — Z79899 Other long term (current) drug therapy: Secondary | ICD-10-CM | POA: Insufficient documentation

## 2017-06-03 LAB — I-STAT TROPONIN, ED: TROPONIN I, POC: 0 ng/mL (ref 0.00–0.08)

## 2017-06-03 LAB — CBC
HCT: 40.8 % (ref 39.0–52.0)
Hemoglobin: 13.7 g/dL (ref 13.0–17.0)
MCH: 31.6 pg (ref 26.0–34.0)
MCHC: 33.6 g/dL (ref 30.0–36.0)
MCV: 94.2 fL (ref 78.0–100.0)
PLATELETS: 243 10*3/uL (ref 150–400)
RBC: 4.33 MIL/uL (ref 4.22–5.81)
RDW: 14 % (ref 11.5–15.5)
WBC: 12.9 10*3/uL — AB (ref 4.0–10.5)

## 2017-06-03 LAB — BASIC METABOLIC PANEL
ANION GAP: 10 (ref 5–15)
BUN: 12 mg/dL (ref 6–20)
CALCIUM: 9.4 mg/dL (ref 8.9–10.3)
CO2: 24 mmol/L (ref 22–32)
CREATININE: 0.53 mg/dL — AB (ref 0.61–1.24)
Chloride: 106 mmol/L (ref 101–111)
Glucose, Bld: 103 mg/dL — ABNORMAL HIGH (ref 65–99)
Potassium: 3.6 mmol/L (ref 3.5–5.1)
SODIUM: 140 mmol/L (ref 135–145)

## 2017-06-03 LAB — ETHANOL: Alcohol, Ethyl (B): 292 mg/dL — ABNORMAL HIGH (ref <10)

## 2017-06-03 MED ORDER — KETOROLAC TROMETHAMINE 30 MG/ML IJ SOLN
30.0000 mg | Freq: Once | INTRAMUSCULAR | Status: AC
  Administered 2017-06-03: 30 mg via INTRAVENOUS
  Filled 2017-06-03: qty 1

## 2017-06-03 MED ORDER — NAPROXEN 500 MG PO TABS: 500.0000 mg | ORAL_TABLET | Freq: Two times a day (BID) | ORAL | 0 refills | Status: DC

## 2017-06-03 NOTE — ED Notes (Signed)
ED Provider at bedside. 

## 2017-06-03 NOTE — Discharge Instructions (Signed)
Please read and follow all provided instructions.  Your diagnoses today include:  1. Chest pain, unspecified type     Tests performed today include: An EKG of your heart A chest x-ray Cardiac enzymes - a blood test for heart muscle damage Blood counts and electrolytes Vital signs. See below for your results today.   Medications prescribed:   Take any prescribed medications only as directed.  Follow-up instructions: Please follow-up with your primary care provider as soon as you can for further evaluation of your symptoms.   Return instructions:  SEEK IMMEDIATE MEDICAL ATTENTION IF: You have severe chest pain, especially if the pain is crushing or pressure-like and spreads to the arms, back, neck, or jaw, or if you have sweating, nausea (feeling sick to your stomach), or shortness of breath. THIS IS AN EMERGENCY. Don't wait to see if the pain will go away. Get medical help at once. Call 911 or 0 (operator). DO NOT drive yourself to the hospital.  Your chest pain gets worse and does not go away with rest.  You have an attack of chest pain lasting longer than usual, despite rest and treatment with the medications your caregiver has prescribed.  You wake from sleep with chest pain or shortness of breath. You feel dizzy or faint. You have chest pain not typical of your usual pain for which you originally saw your caregiver.  You have any other emergent concerns regarding your health.  Additional Information: Chest pain comes from many different causes. Your caregiver has diagnosed you as having chest pain that is not specific for one problem, but does not require admission.  You are at low risk for an acute heart condition or other serious illness.   Your vital signs today were: BP (!) 143/96 (BP Location: Right Arm)    Pulse 88    Temp 98.6 F (37 C) (Oral)    Resp 16    Ht 5\' 11"  (1.803 m)    Wt 99.8 kg (220 lb)    SpO2 98%    BMI 30.68 kg/m  If your blood pressure (BP) was elevated  above 135/85 this visit, please have this repeated by your doctor within one month. --------------

## 2017-06-03 NOTE — ED Provider Notes (Signed)
[facility]   CSN: [id number] Arrival date & time: 06/03/17  1520     History   Chief Complaint Chief Complaint  Patient presents with  . Chest Pain    HPI Edwin [name] is a 36 y.o. male.  HPI  36 y.o. male with a hx of HTN, presents to the Emergency Department today due to chest pain. States this pain has been intermittent x 2 months. Thinks this is likely due to heavy lifting at work. Denies worsening with exertion. Seems to be unchanged with rest or exertion. Notes nausea occasionally. No emesis. No diaphoresis. States he gets tingling sensations down left arm. Denies pain currently. States episode today lasted several hours. Spontaneously went away. Notes origin of pain is left anterior chest wall. Notes area is tender to palpation. No fevers. No cough/congestion. Spouse notified EMS due to slurred speech and thought he was having a stroke. Pt states he has just been drinking today and spouse did not know this. EMS was notified with negative stroke screen. Given 324 ASA en route as well as 0.4 NTG with no change in pain status. No other symptoms noted   Past Medical History:  Diagnosis Date  . Hypertension     There are no active problems to display for this patient.   History reviewed. No pertinent surgical history.     Home Medications    Prior to Admission medications   Medication Sig Start Date End Date Taking? Authorizing Provider  acetaminophen (TYLENOL) 325 MG tablet Take 2 tablets (650 mg total) by mouth every 6 (six) hours as needed. 06/16/15   Sam, Ace GinsSerena Y, PA-C  amLODipine (NORVASC) 5 MG tablet Take 1 tablet (5 mg total) by mouth daily. Patient not taking: Reported on 06/16/2015 09/17/14   Charm RingsHonig, Erin J, MD  dicyclomine (BENTYL) 20 MG tablet Take 1 tablet (20 mg total) by mouth 2 (two) times daily as needed (abdominal pain). 06/16/15   Sam, Ace GinsSerena Y, PA-C  ibuprofen (ADVIL,MOTRIN) 800 MG tablet Take 1  tablet (800 mg total) by mouth 3 (three) times daily. 02/07/14   Hess, Nada Boozerobyn M, PA-C  oseltamivir (TAMIFLU) 75 MG capsule Take 1 capsule (75 mg total) by mouth every 12 (twelve) hours. 09/17/14   Charm RingsHonig, Erin J, MD    Family History No family history on file.  Social History Social History   Tobacco Use  . Smoking status: Current Every Day Smoker    Packs/day: 1.00  . Smokeless tobacco: Never Used  Substance Use Topics  . Alcohol use: Yes    Comment: 2 40oz every day  . Drug use: No     Allergies   Patient has no known allergies.   Review of Systems Review of Systems ROS reviewed and all are negative for acute change except as noted in the HPI.  Physical Exam Updated Vital Signs BP (!) 143/96 (BP Location: Right Arm)   Pulse 88   Temp 98.6 F (37 C) (Oral)   Resp 16   Ht 5\' 11"  (1.803 m)   Wt 99.8 kg (220 lb)   SpO2 98%   BMI 30.68 kg/m   Physical Exam  Constitutional: He is oriented to person, place, and time. He appears well-developed and well-nourished. No distress.  Noted ETOH odor on breath  HENT:  Head: Normocephalic and atraumatic.  Right Ear: Tympanic membrane, external ear and ear canal normal.  Left Ear: Tympanic membrane, external ear and ear canal normal.  Nose:  Nose normal.  Mouth/Throat: Uvula is midline, oropharynx is clear and moist and mucous membranes are normal. No trismus in the jaw. No oropharyngeal exudate, posterior oropharyngeal erythema or tonsillar abscesses.  Eyes: EOM are normal. Pupils are equal, round, and reactive to light.  Neck: Normal range of motion. Neck supple. No tracheal deviation present.  Cardiovascular: Normal rate, regular rhythm, S1 normal, S2 normal, normal heart sounds, intact distal pulses and normal pulses.  Pulmonary/Chest: Effort normal and breath sounds normal. No respiratory distress. He has no decreased breath sounds. He has no wheezes. He has no rhonchi. He has no rales. He exhibits tenderness.  TTP left anterior  chest wall. No palpable or visible deformities   Abdominal: Normal appearance and bowel sounds are normal. There is no tenderness.  Musculoskeletal: Normal range of motion.  Neurological: He is alert and oriented to person, place, and time.  Skin: Skin is warm and dry.  Psychiatric: He has a normal mood and affect. His speech is normal and behavior is normal. Thought content normal.  Nursing note and vitals reviewed.  ED Treatments / Results  Labs (all labs ordered are listed, but only abnormal results are displayed) Labs Reviewed  CBC - Abnormal; Notable for the following components:      Result Value   WBC 12.9 (*)    All other components within normal limits  BASIC METABOLIC PANEL - Abnormal; Notable for the following components:   Glucose, Bld 103 (*)    Creatinine, Ser 0.53 (*)    All other components within normal limits  ETHANOL  I-STAT TROPONIN, ED    EKG  EKG Interpretation None       Radiology Dg Chest 2 View  Result Date: 06/03/2017 CLINICAL DATA:  Chest pain radiating into left arm and left arm numbness. Nausea and shortness of breath. EXAM: CHEST  2 VIEW COMPARISON:  None. FINDINGS: The heart size and mediastinal contours are within normal limits. Mild atelectasis present at both lung bases. There is no evidence of pulmonary edema, consolidation, pneumothorax, nodule or pleural fluid. The visualized skeletal structures are unremarkable. IMPRESSION: Mild bibasilar atelectasis.  No acute findings. Electronically Signed   By: Irish LackGlenn  Yamagata M.D.   On: 06/03/2017 16:00    Procedures Procedures (including critical care time)  Medications Ordered in ED Medications  ketorolac (TORADOL) 30 MG/ML injection 30 mg (not administered)     Initial Impression / Assessment and Plan / ED Course  I have reviewed the triage vital signs and the nursing notes.  Pertinent labs & imaging results that were available during my care of the patient were reviewed by me and  considered in my medical decision making (see chart for details).  Final Clinical Impressions(s) / ED Diagnoses  {I have reviewed and evaluated the relevant laboratory values. {I have reviewed and evaluated the relevant imaging studies. {I have interpreted the relevant EKG. {I have reviewed the relevant previous healthcare records. {I have reviewed EMS Documentation. {I obtained HPI from historian.   ED Course:  Assessment: Pt is a [age] y.o. male with a hx of HTN, presents to the Emergency Department today due to chest pain. States this pain has been intermittent x 2 months. Thinks this is likely due to heavy lifting at work. Denies worsening with exertion. Seems to be unchanged with rest or exertion. Notes nausea occasionally. No emesis. No diaphoresis. States he gets tingling sensations down left arm. Denies pain currently. States episode today lasted several hours. Spontaneously went away. Notes origin of  pain is left anterior chest wall. Notes area is tender to palpation. No fevers. No cough/congestion. Spouse notified EMS due to slurred speech and thought he was having a stroke. Pt states he has just been drinking today and spouse did not know this. EMS was notified with negative stroke screen. Given 324 ASA en route as well as 0.4 NTG with no change in pain status. Given analgesia in ED. Patient is to be discharged with recommendation to follow up with PCP in regards to today's hospital visit. Chest pain is not likely of cardiac or pulmonary etiology d/t presentation, perc negative, VSS, no tracheal deviation, no JVD or new murmur, RRR, breath sounds equal bilaterally, EKG without acute abnormalities, negative troponin, and negative CXR. ETOH level 252. Heart Score 1. Pt has been advised start NSAIDs and return to the ED is CP becomes exertional, associated with diaphoresis or nausea, radiates to left jaw/arm, worsens or becomes concerning in any way. Likely musculoskeletal. Observed in ED. Pt clinically  sober. Pt appears reliable for follow up and is agreeable to discharge. Patient is in no acute distress. Vital Signs are stable. Patient is able to ambulate. Patient able to tolerate PO.   Disposition/Plan:  DC Home Additional Verbal discharge instructions given and discussed with patient.  Pt Instructed to f/u with PCP in the next week for evaluation and treatment of symptoms. Return precautions given Pt acknowledges and agrees with plan  Supervising Physician Charlynne Pander, MD  Final diagnoses:  Chest pain, unspecified type    ED Discharge Orders    None       Audry Pili, PA-C 06/03/17 1647    Charlynne Pander, MD 06/04/17 [phone]

## 2017-06-03 NOTE — ED Triage Notes (Addendum)
Per [facility], pt has had intermittent sternal CP x2 months. Pt reports radiation to left arm and left arm numbness, nausea without vomitting and SOB X 2 months. Friend called EMS because she thought he was having stroke symptoms. EMS denies stroke symptoms except slurred speech, but reports pt is intoxicated. Pt reports drinking today. Pt hx of HTN and non-compliant with meds. Pt received 324 of ASA and 0.4 of NTG with no relief. Pt reports CP 9/10 as sharp and intermittent.

## 2017-06-03 NOTE — ED Notes (Addendum)
Patient transported to XR. 

## 2018-03-13 ENCOUNTER — Encounter (HOSPITAL_COMMUNITY): Payer: Self-pay | Admitting: *Deleted

## 2018-03-13 ENCOUNTER — Emergency Department (HOSPITAL_COMMUNITY)
Admission: EM | Admit: 2018-03-13 | Discharge: 2018-03-13 | Disposition: A | Discharge status: Home / Self Care | Payer: PRIVATE HEALTH INSURANCE | Attending: Emergency Medicine | Admitting: Emergency Medicine

## 2018-03-13 DIAGNOSIS — M545 Low back pain, unspecified: Secondary | ICD-10-CM

## 2018-03-13 DIAGNOSIS — F17210 Nicotine dependence, cigarettes, uncomplicated: Secondary | ICD-10-CM | POA: Insufficient documentation

## 2018-03-13 DIAGNOSIS — I10 Essential (primary) hypertension: Secondary | ICD-10-CM | POA: Insufficient documentation

## 2018-03-13 MED ORDER — CYCLOBENZAPRINE HCL 10 MG PO TABS: 10.0000 mg | ORAL_TABLET | Freq: Three times a day (TID) | ORAL | 0 refills | Status: AC | PRN

## 2018-03-13 NOTE — Discharge Instructions (Addendum)
Your physical exam today looks good. Your pain is coming from muscle strain.  I have written you a prescription for Flexeril which is a muscle relaxer. This should help if you have muscle spasms. You may use Tylenol, Ibuprofen and/or warm compresses for pain relief.  Your blood pressure You may follow-up with your PCP if you continue to have issues for more than 4-6 weeks.  Take care of yourself and have a good weekend!

## 2018-03-13 NOTE — ED Triage Notes (Signed)
Pt complains of lower back that started while he was at work. Pain is worse with movement.

## 2018-03-13 NOTE — ED Provider Notes (Signed)
[facility]-EMERGENCY DEPT Provider Note  CSN: [id number] Arrival date & time: 03/13/18  1616  History   Chief Complaint Chief Complaint  Patient presents with  . Back Pain    HPI Edwin [name] is a 37 y.o. male with a medical history of HTN who presented to the ED for back pain x1 day. Patient describes throbbing pain in his right low-mid back that began at work after a lot of lifting. Pain does not radiate to his lower extremities. Denies fever, other arthralgias, skin rashes/lesions, bowel or bladder incontinence, saddle anesthesia, paresthesias, weakness or gait difficulties. Pain is worse with twisting and bending. It is alleviated with rest. Patient has no prior history of back pain. Patient has tried nothing prior to coming to the ED.  Past Medical History:  Diagnosis Date  . Hypertension     There are no active problems to display for this patient.   History reviewed. No pertinent surgical history.      Home Medications    Prior to Admission medications   Medication Sig Start Date End Date Taking? Authorizing Provider  acetaminophen (TYLENOL) 325 MG tablet Take 2 tablets (650 mg total) by mouth every 6 (six) hours as needed. 06/16/15   Sam, Ace Gins, PA-C  amLODipine (NORVASC) 5 MG tablet Take 1 tablet (5 mg total) by mouth daily. Patient not taking: Reported on 06/16/2015 09/17/14   Charm Rings, MD  cyclobenzaprine (FLEXERIL) 10 MG tablet Take 1 tablet (10 mg total) by mouth 3 (three) times daily as needed for up to 10 days for muscle spasms. 03/13/18 03/23/18  Mortis, Jerrel Ivory I, PA-C  dicyclomine (BENTYL) 20 MG tablet Take 1 tablet (20 mg total) by mouth 2 (two) times daily as needed (abdominal pain). 06/16/15   Sam, Ace Gins, PA-C  ibuprofen (ADVIL,MOTRIN) 800 MG tablet Take 1 tablet (800 mg total) by mouth 3 (three) times daily. Patient taking differently: Take 800 mg by mouth every 6 (six) hours as needed.  02/07/14   Hess, Nada Boozer, PA-C    naproxen (NAPROSYN) 500 MG tablet Take 1 tablet (500 mg total) by mouth 2 (two) times daily. 06/03/17   Audry Pili, PA-C  oseltamivir (TAMIFLU) 75 MG capsule Take 1 capsule (75 mg total) by mouth every 12 (twelve) hours. 09/17/14   Charm Rings, MD    Family History No family history on file.  Social History Social History   Tobacco Use  . Smoking status: Current Every Day Smoker    Packs/day: 1.00  . Smokeless tobacco: Never Used  Substance Use Topics  . Alcohol use: Yes    Comment: 2 40oz every day  . Drug use: No     Allergies   Patient has no known allergies.   Review of Systems Review of Systems  Constitutional: Negative for chills and fever.  Gastrointestinal: Negative.   Genitourinary: Negative.   Musculoskeletal: Positive for back pain. Negative for gait problem and neck pain.  Skin: Negative.   Neurological: Negative.      Physical Exam Updated Vital Signs BP (!) 157/108 (BP Location: Left Arm)   Pulse 89   Temp 98.6 F (37 C) (Oral)   Resp 18   SpO2 100%   Physical Exam  Constitutional: He appears well-developed and well-nourished. No distress.  Neck: Normal range of motion. Neck supple.  Cardiovascular: Normal rate, regular rhythm and intact distal pulses.  No murmur heard. Pulmonary/Chest: Effort normal and breath sounds normal.  Musculoskeletal:  Full ROM  of lower extremities bilaterally with 5/5 strength. Right thoracic and lumbar paraspinal muscle tenderness to palpation and with lateral flexion. No midline tenderness.  Neurological: He is alert. He has normal strength. He displays no atrophy. No sensory deficit. He exhibits normal muscle tone. Coordination and gait normal.  Reflex Scores:      Patellar reflexes are 2+ on the right side and 2+ on the left side.      Achilles reflexes are 2+ on the right side and 2+ on the left side. Skin: Skin is warm. Capillary refill takes less than 2 seconds. No rash noted.  Nursing note and vitals  reviewed.  ED Treatments / Results  Labs (all labs ordered are listed, but only abnormal results are displayed) Labs Reviewed - No data to display  EKG None  Radiology No results found.  Procedures Procedures (including critical care time)  Medications Ordered in ED Medications - No data to display   Initial Impression / Assessment and Plan / ED Course  Triage vital signs and the nursing notes have been reviewed.  Pertinent labs & imaging results that were available during care of the patient were reviewed and considered in medical decision making (see chart for details).  Patient presents well appearing and in no acute distress. He is able to ambulate on his own and does so without assistance or issue. Patient reports that pain began after excessive lifting at work this afternoon. Physical exam is reassuring. With the exception of muscular tenderness to palpation and with ROM, her MSK and neuro exam is normal. There is no midline tenderness, deformities or abnormal neuro findings on exam or in history to suggest an acute spinal cord or osseus pathology that warrant imaging today. No systemic s/s to suggest underlying infectious or rheumatologic etiology.  Clinical Course as of Mar 13 1954  Thu Mar 13, 2018  1947 Hypertensive at 161/107 in triage. Patient diagnosed with HTN and states it usually runs high. Patient states he has been without an anti-hypertensive for years because of side effects. No s/s of end organ damage that warrant further intervention. Will recheck BP prior to discharge. Advised to re-establish and follow-up with PCP regarding this.   [GM]    Clinical Course User Index [GM] Mortis, Sharyon Medicus, PA-C    Final Clinical Impressions(s) / ED Diagnoses  1. Low Back Pain. Rx for Flexeril prescribed for muscle spasms. Education provided on OTC and supportive treatment. Advised to follow-up with PCP in 4-6 weeks. 2. Hypertension. Advised to follow-up with PCP  regarding antihypertensive.  Dispo: Home. After thorough clinical evaluation, this patient is determined to be medically stable and can be safely discharged with the previously mentioned treatment and/or outpatient follow-up/referral(s). At this time, there are no other apparent medical conditions that require further screening, evaluation or treatment.  Final diagnoses:  Acute right-sided low back pain without sciatica  Essential hypertension    ED Discharge Orders         Ordered    cyclobenzaprine (FLEXERIL) 10 MG tablet  3 times daily PRN     03/13/18 1944            Mortis, Sharyon Medicus, PA-C 03/13/18 1955    Melene Plan, DO 03/13/18 2022

## 2019-06-15 ENCOUNTER — Ambulatory Visit: Payer: PRIVATE HEALTH INSURANCE | Attending: Internal Medicine

## 2019-06-15 DIAGNOSIS — Z20822 Contact with and (suspected) exposure to covid-19: Secondary | ICD-10-CM

## 2019-06-16 LAB — NOVEL CORONAVIRUS, NAA: SARS-CoV-2, NAA: NOT DETECTED

## 2019-06-17 ENCOUNTER — Telehealth: Payer: Self-pay

## 2019-06-17 ENCOUNTER — Telehealth: Payer: Self-pay | Admitting: *Deleted

## 2019-06-17 NOTE — Telephone Encounter (Signed)
Pt notified of negative COVID-19 results. Understanding verbalized.  Chasta M Hopkins   

## 2019-06-17 NOTE — Telephone Encounter (Signed)
Patient called.

## 2019-06-17 NOTE — Telephone Encounter (Signed)
Patient called and is requesting results faxed to his employer ,[employer] fax # [phone]  Cb# [phone]  [phone]

## 2019-10-09 ENCOUNTER — Encounter: Payer: Self-pay | Admitting: Internal Medicine

## 2019-10-09 ENCOUNTER — Other Ambulatory Visit: Payer: Self-pay

## 2019-10-09 ENCOUNTER — Ambulatory Visit (INDEPENDENT_AMBULATORY_CARE_PROVIDER_SITE_OTHER): Payer: 59 | Admitting: Internal Medicine

## 2019-10-09 DIAGNOSIS — F1010 Alcohol abuse, uncomplicated: Secondary | ICD-10-CM | POA: Diagnosis not present

## 2019-10-09 DIAGNOSIS — I10 Essential (primary) hypertension: Secondary | ICD-10-CM | POA: Insufficient documentation

## 2019-10-09 DIAGNOSIS — Z720 Tobacco use: Secondary | ICD-10-CM | POA: Insufficient documentation

## 2019-10-09 MED ORDER — AMLODIPINE BESYLATE 10 MG PO TABS: 10.0000 mg | ORAL_TABLET | Freq: Every day | ORAL | 1 refills | Status: DC

## 2019-10-09 NOTE — Progress Notes (Signed)
New Patient Office Visit     This visit occurred during the SARS-CoV-2 public health emergency.  Safety protocols were in place, including screening questions prior to the visit, additional usage of staff PPE, and extensive cleaning of exam room while observing appropriate contact time as indicated for disinfecting solutions.    CC/Reason for Visit: Establish care, discuss chronic conditions Previous PCP: None Last Visit: Unknown  HPI: Edwin [name] is a 39 y.o. male who is coming in today for the above mentioned reasons. Past Medical History is significant for: Hypertension for which he was prescribed a medication years ago but has not been taking.  He is also a smoker of a pack a day for over 20 years and drinks a significant amount of alcohol of 1 pint per day.  He works as a Location managermachine operator he has 3 children ages 2214, 5915 and 9317.  He has no allergies, no past surgical history.  His family history significant for both mother and father with hypertension.  He is fasting today and is requesting lab work.   Past Medical/Surgical History: Past Medical History:  Diagnosis Date  . Alcohol abuse   . Hypertension   . Tobacco abuse     History reviewed. No pertinent surgical history.  Social History:  reports that he has been smoking. He has been smoking about 1.00 pack per day. He has never used smokeless tobacco. He reports current alcohol use. He reports that he does not use drugs.  Allergies: No Known Allergies  Family History:  Family History  Problem Relation Age of Onset  . Hypertension Mother   . Hypertension Father      Current Outpatient Medications:  .  acetaminophen (TYLENOL) 325 MG tablet, Take 2 tablets (650 mg total) by mouth every 6 (six) hours as needed., Disp: 30 tablet, Rfl: 0 .  ibuprofen (ADVIL,MOTRIN) 800 MG tablet, Take 1 tablet (800 mg total) by mouth 3 (three) times daily. (Patient taking differently: Take 800 mg by mouth every 6 (six) hours as  needed. ), Disp: 21 tablet, Rfl: 0 .  amLODipine (NORVASC) 10 MG tablet, Take 1 tablet (10 mg total) by mouth daily., Disp: 90 tablet, Rfl: 1  Review of Systems:  Constitutional: Denies fever, chills, diaphoresis, appetite change and fatigue.  HEENT: Denies photophobia, eye pain, redness, hearing loss, ear pain, congestion, sore throat, rhinorrhea, sneezing, mouth sores, trouble swallowing, neck pain, neck stiffness and tinnitus.   Respiratory: Denies SOB, DOE, cough, chest tightness,  and wheezing.   Cardiovascular: Denies chest pain, palpitations and leg swelling.  Gastrointestinal: Denies nausea, vomiting, abdominal pain, diarrhea, constipation, blood in stool and abdominal distention.  Genitourinary: Denies dysuria, urgency, frequency, hematuria, flank pain and difficulty urinating.  Endocrine: Denies: hot or cold intolerance, sweats, changes in hair or nails, polyuria, polydipsia. Musculoskeletal: Denies myalgias, back pain, joint swelling, arthralgias and gait problem.  Skin: Denies pallor, rash and wound.  Neurological: Denies dizziness, seizures, syncope, weakness, light-headedness, numbness and headaches.  Hematological: Denies adenopathy. Easy bruising, personal or family bleeding history  Psychiatric/Behavioral: Denies suicidal ideation, mood changes, confusion, nervousness, sleep disturbance and agitation    Physical Exam: Vitals:   10/09/19 1505  BP: (!) 160/100  Pulse: 81  Temp: 98.4 F (36.9 C)  TempSrc: Temporal  SpO2: 99%  Weight: 205 lb 8 oz (93.2 kg)  Height: 5\' 11"  (1.803 m)   Body mass index is 28.66 kg/m.  Constitutional: NAD, calm, comfortable Eyes: PERRL, lids and conjunctivae normal ENMT: Mucous  membranes are moist.  Respiratory: clear to auscultation bilaterally, no wheezing, no crackles. Normal respiratory effort. No accessory muscle use.  Cardiovascular: Regular rate and rhythm, no murmurs / rubs / gallops. No extremity edema.  Neurologic: Grossly  intact and nonfocal Psychiatric: Normal judgment and insight. Alert and oriented x 3. Normal mood.    Impression and Plan:  Essential hypertension -Start amlodipine 10 mg daily. -Check labs today. -He will return in 6 weeks for follow-up blood pressure.  Tobacco abuse -I have discussed tobacco cessation with the patient.  I have counseled the patient regarding the negative impacts of continued tobacco use including but not limited to lung cancer, COPD, and cardiovascular disease.  I have discussed alternatives to tobacco and modalities that may help facilitate tobacco cessation including but not limited to biofeedback, hypnosis, and medications.  Total time spent with tobacco counseling was 5 minutes. -He remains in the precontemplative stage and is not ready to quit at present. -I will continue to address this with him at subsequent visits.   Alcohol abuse -We have discussed potential deleterious consequences of this degree of alcohol use.  We have discussed cessation.  Continue to address at subsequent visits.   Patient Instructions  -Nice seeing you today!!  -Lab work today; will notify you once results are available.  -Start taking norvasc (amlodipine) 10 mg daily for blood pressure.  -Schedule follow up in 6 weeks.  -Low salt diet (see below).   DASH Eating Plan DASH stands for "Dietary Approaches to Stop Hypertension." The DASH eating plan is a healthy eating plan that has been shown to reduce high blood pressure (hypertension). It may also reduce your risk for type 2 diabetes, heart disease, and stroke. The DASH eating plan may also help with weight loss. What are tips for following this plan?  General guidelines  Avoid eating more than 2,300 mg (milligrams) of salt (sodium) a day. If you have hypertension, you may need to reduce your sodium intake to 1,500 mg a day.  Limit alcohol intake to no more than 1 drink a day for nonpregnant women and 2 drinks a day for men.  One drink equals 12 oz of beer, 5 oz of wine, or 1 oz of hard liquor.  Work with your health care provider to maintain a healthy body weight or to lose weight. Ask what an ideal weight is for you.  Get at least 30 minutes of exercise that causes your heart to beat faster (aerobic exercise) most days of the week. Activities may include walking, swimming, or biking.  Work with your health care provider or diet and nutrition specialist (dietitian) to adjust your eating plan to your individual calorie needs. Reading food labels   Check food labels for the amount of sodium per serving. Choose foods with less than 5 percent of the Daily Value of sodium. Generally, foods with less than 300 mg of sodium per serving fit into this eating plan.  To find whole grains, look for the word "whole" as the first word in the ingredient list. Shopping  Buy products labeled as "low-sodium" or "no salt added."  Buy fresh foods. Avoid canned foods and premade or frozen meals. Cooking  Avoid adding salt when cooking. Use salt-free seasonings or herbs instead of table salt or sea salt. Check with your health care provider or pharmacist before using salt substitutes.  Do not fry foods. Cook foods using healthy methods such as baking, boiling, grilling, and broiling instead.  Cook with heart-healthy oils,  such as olive, canola, soybean, or sunflower oil. Meal planning  Eat a balanced diet that includes: ? 5 or more servings of fruits and vegetables each day. At each meal, try to fill half of your plate with fruits and vegetables. ? Up to 6-8 servings of whole grains each day. ? Less than 6 oz of lean meat, poultry, or fish each day. A 3-oz serving of meat is about the same size as a deck of cards. One egg equals 1 oz. ? 2 servings of low-fat dairy each day. ? A serving of nuts, seeds, or beans 5 times each week. ? Heart-healthy fats. Healthy fats called Omega-3 fatty acids are found in foods such as flaxseeds  and coldwater fish, like sardines, salmon, and mackerel.  Limit how much you eat of the following: ? Canned or prepackaged foods. ? Food that is high in trans fat, such as fried foods. ? Food that is high in saturated fat, such as fatty meat. ? Sweets, desserts, sugary drinks, and other foods with added sugar. ? Full-fat dairy products.  Do not salt foods before eating.  Try to eat at least 2 vegetarian meals each week.  Eat more home-cooked food and less restaurant, buffet, and fast food.  When eating at a restaurant, ask that your food be prepared with less salt or no salt, if possible. What foods are recommended? The items listed may not be a complete list. Talk with your dietitian about what dietary choices are best for you. Grains Whole-grain or whole-wheat bread. Whole-grain or whole-wheat pasta. Brown rice. [place]. Bulgur. Whole-grain and low-sodium cereals. Pita bread. Low-fat, low-sodium crackers. Whole-wheat flour tortillas. Vegetables Fresh or frozen vegetables (raw, steamed, roasted, or grilled). Low-sodium or reduced-sodium tomato and vegetable juice. Low-sodium or reduced-sodium tomato sauce and tomato paste. Low-sodium or reduced-sodium canned vegetables. Fruits All fresh, dried, or frozen fruit. Canned fruit in natural juice (without added sugar). Meat and other protein foods Skinless chicken or [place]. Ground chicken or [place]. Pork with fat trimmed off. Fish and seafood. Egg whites. Dried beans, peas, or lentils. Unsalted nuts, nut butters, and seeds. Unsalted canned beans. Lean cuts of beef with fat trimmed off. Low-sodium, lean deli meat. Dairy Low-fat (1%) or fat-free (skim) milk. Fat-free, low-fat, or reduced-fat cheeses. Nonfat, low-sodium ricotta or cottage cheese. Low-fat or nonfat yogurt. Low-fat, low-sodium cheese. Fats and oils Soft margarine without trans fats. Vegetable oil. Low-fat, reduced-fat, or light mayonnaise and salad dressings  (reduced-sodium). Canola, safflower, olive, soybean, and sunflower oils. Avocado. Seasoning and other foods Herbs. Spices. Seasoning mixes without salt. Unsalted popcorn and pretzels. Fat-free sweets. What foods are not recommended? The items listed may not be a complete list. Talk with your dietitian about what dietary choices are best for you. Grains Baked goods made with fat, such as croissants, muffins, or some breads. Dry pasta or rice meal packs. Vegetables Creamed or fried vegetables. Vegetables in a cheese sauce. Regular canned vegetables (not low-sodium or reduced-sodium). Regular canned tomato sauce and paste (not low-sodium or reduced-sodium). Regular tomato and vegetable juice (not low-sodium or reduced-sodium). Angie Fava. Olives. Fruits Canned fruit in a light or heavy syrup. Fried fruit. Fruit in cream or butter sauce. Meat and other protein foods Fatty cuts of meat. Ribs. Fried meat. Berniece Salines. Sausage. Bologna and other processed lunch meats. Salami. Fatback. Hotdogs. Bratwurst. Salted nuts and seeds. Canned beans with added salt. Canned or smoked fish. Whole eggs or egg yolks. Chicken or [place] with skin. Dairy Whole or 2% milk, cream,  and half-and-half. Whole or full-fat cream cheese. Whole-fat or sweetened yogurt. Full-fat cheese. Nondairy creamers. Whipped toppings. Processed cheese and cheese spreads. Fats and oils Butter. Stick margarine. Lard. Shortening. Ghee. Bacon fat. Tropical oils, such as coconut, palm kernel, or palm oil. Seasoning and other foods Salted popcorn and pretzels. Onion salt, garlic salt, seasoned salt, table salt, and sea salt. Worcestershire sauce. Tartar sauce. Barbecue sauce. Teriyaki sauce. Soy sauce, including reduced-sodium. Steak sauce. Canned and packaged gravies. Fish sauce. Oyster sauce. Cocktail sauce. Horseradish that you find on the shelf. Ketchup. Mustard. Meat flavorings and tenderizers. Bouillon cubes. Hot sauce and Tabasco sauce. Premade or  packaged marinades. Premade or packaged taco seasonings. Relishes. Regular salad dressings. Where to find more information:  National Heart, Lung, and Blood Institute: PopSteam.is  American Heart Association: www.heart.org Summary  The DASH eating plan is a healthy eating plan that has been shown to reduce high blood pressure (hypertension). It may also reduce your risk for type 2 diabetes, heart disease, and stroke.  With the DASH eating plan, you should limit salt (sodium) intake to 2,300 mg a day. If you have hypertension, you may need to reduce your sodium intake to 1,500 mg a day.  When on the DASH eating plan, aim to eat more fresh fruits and vegetables, whole grains, lean proteins, low-fat dairy, and heart-healthy fats.  Work with your health care provider or diet and nutrition specialist (dietitian) to adjust your eating plan to your individual calorie needs. This information is not intended to replace advice given to you by your health care provider. Make sure you discuss any questions you have with your health care provider. Document Revised: 05/10/2017 Document Reviewed: 05/21/2016 Elsevier Patient Education  2020 Else[employer] Inc.    Tobacco Use Disorder Tobacco use disorder (TUD) occurs when a person craves, seeks, and uses tobacco, regardless of the consequences. This disorder can cause problems with mental and physical health. It can affect your ability to have healthy relationships, and it can keep you from meeting your responsibilities at work, home, or school. Tobacco may be:  Smoked as a cigarette or cigar.  Inhaled using e-cigarettes.  Smoked in a pipe or hookah.  Chewed as smokeless tobacco.  Inhaled into the nostrils as snuff. Tobacco products contain a dangerous chemical called nicotine, which is very addictive. Nicotine triggers hormones that make the body feel stimulated and works on areas of the brain that make you feel good. These effects can make it  hard for people to quit nicotine. Tobacco contains many other unsafe chemicals that can damage almost every organ in the body. Smoking tobacco also puts others in danger due to fire risk and possible health problems caused by breathing in secondhand smoke. What are the signs or symptoms? Symptoms of TUD may include:  Being unable to slow down or stop your tobacco use.  Spending an abnormal amount of time getting or using tobacco.  Craving tobacco. Cravings may last for up to 6 months after quitting.  Tobacco use that: ? Interferes with your work, school, or home life. ? Interferes with your personal and social relationships. ? Makes you give up activities that you once enjoyed or found important.  Using tobacco even though you know that it is: ? Dangerous or bad for your health or someone else's health. ? Causing problems in your life.  Needing more and more of the substance to get the same effect (developing tolerance).  Experiencing unpleasant symptoms if you do not use the substance (withdrawal).  Withdrawal symptoms may include: ? Depressed, anxious, or irritable mood. ? Difficulty concentrating. ? Increased appetite. ? Restlessness or trouble sleeping.  Using the substance to avoid withdrawal. How is this diagnosed? This condition may be diagnosed based on:  Your current and past tobacco use. Your health care provider may ask questions about how your tobacco use affects your life.  A physical exam. You may be diagnosed with TUD if you have at least two symptoms within a 35-month period. How is this treated? This condition is treated by stopping tobacco use. Many people are unable to quit on their own and need help. Treatment may include:  Nicotine replacement therapy (NRT). NRT provides nicotine without the other harmful chemicals in tobacco. NRT gradually lowers the dosage of nicotine in the body and reduces withdrawal symptoms. NRT is available as: ? Over-the-counter  gums, lozenges, and skin patches. ? Prescription mouth inhalers and nasal sprays.  Medicine that acts on the brain to reduce cravings and withdrawal symptoms.  A type of talk therapy that examines your triggers for tobacco use, how to avoid them, and how to cope with cravings (behavioral therapy).  Hypnosis. This may help with withdrawal symptoms.  Joining a support group for others coping with TUD. The best treatment for TUD is usually a combination of medicine, talk therapy, and support groups. Recovery can be a long process. Many people start using tobacco again after stopping (relapse). If you relapse, it does not mean that treatment will not work. Follow these instructions at home:  Lifestyle  Do not use any products that contain nicotine or tobacco, such as cigarettes and e-cigarettes.  Avoid things that trigger tobacco use as much as you can. Triggers include people and situations that usually cause you to use tobacco.  Avoid drinks that contain caffeine, including coffee. These may worsen some withdrawal symptoms.  Find ways to manage stress. Wanting to smoke may cause stress, and stress can make you want to smoke. Relaxation techniques such as deep breathing, meditation, and yoga may help.  Attend support groups as needed. These groups are an important part of long-term recovery for many people. General instructions  Take over-the-counter and prescription medicines only as told by your health care provider.  Check with your health care provider before taking any new prescription or over-the-counter medicines.  Decide on a friend, family member, or smoking quit-line (such as 1-800-QUIT-NOW in the [place] that you can call or text when you feel the urge to smoke or when you need help coping with cravings.  Keep all follow-up visits as told by your health care provider and therapist. This is important. Contact a health care provider if:  You are not able to take your medicines  as prescribed.  Your symptoms get worse, even with treatment. Summary  Tobacco use disorder (TUD) occurs when a person craves, seeks, and uses tobacco regardless of the consequences.  This condition may be diagnosed based on your current and past tobacco use and a physical exam.  Many people are unable to quit on their own and need help. Recovery can be a long process.  The most effective treatment for TUD is usually a combination of medicine, talk therapy, and support groups. This information is not intended to replace advice given to you by your health care provider. Make sure you discuss any questions you have with your health care provider. Document Revised: 05/15/2017 Document Reviewed: 05/15/2017 Elsevier Patient Education  2020 [employer].       [place]  Isaac Bliss, MD [facility]

## 2019-10-09 NOTE — Patient Instructions (Signed)
-Nice seeing you today!!  -Lab work today; will notify you once results are available.  -Start taking norvasc (amlodipine) 10 mg daily for blood pressure.  -Schedule follow up in 6 weeks.  -Low salt diet (see below).   DASH Eating Plan DASH stands for "Dietary Approaches to Stop Hypertension." The DASH eating plan is a healthy eating plan that has been shown to reduce high blood pressure (hypertension). It may also reduce your risk for type 2 diabetes, heart disease, and stroke. The DASH eating plan may also help with weight loss. What are tips for following this plan?  General guidelines  Avoid eating more than 2,300 mg (milligrams) of salt (sodium) a day. If you have hypertension, you may need to reduce your sodium intake to 1,500 mg a day.  Limit alcohol intake to no more than 1 drink a day for nonpregnant women and 2 drinks a day for men. One drink equals 12 oz of beer, 5 oz of wine, or 1 oz of hard liquor.  Work with your health care provider to maintain a healthy body weight or to lose weight. Ask what an ideal weight is for you.  Get at least 30 minutes of exercise that causes your heart to beat faster (aerobic exercise) most days of the week. Activities may include walking, swimming, or biking.  Work with your health care provider or diet and nutrition specialist (dietitian) to adjust your eating plan to your individual calorie needs. Reading food labels   Check food labels for the amount of sodium per serving. Choose foods with less than 5 percent of the Daily Value of sodium. Generally, foods with less than 300 mg of sodium per serving fit into this eating plan.  To find whole grains, look for the word "whole" as the first word in the ingredient list. Shopping  Buy products labeled as "low-sodium" or "no salt added."  Buy fresh foods. Avoid canned foods and premade or frozen meals. Cooking  Avoid adding salt when cooking. Use salt-free seasonings or herbs instead of  table salt or sea salt. Check with your health care provider or pharmacist before using salt substitutes.  Do not fry foods. Cook foods using healthy methods such as baking, boiling, grilling, and broiling instead.  Cook with heart-healthy oils, such as olive, canola, soybean, or sunflower oil. Meal planning  Eat a balanced diet that includes: ? 5 or more servings of fruits and vegetables each day. At each meal, try to fill half of your plate with fruits and vegetables. ? Up to 6-8 servings of whole grains each day. ? Less than 6 oz of lean meat, poultry, or fish each day. A 3-oz serving of meat is about the same size as a deck of cards. One egg equals 1 oz. ? 2 servings of low-fat dairy each day. ? A serving of nuts, seeds, or beans 5 times each week. ? Heart-healthy fats. Healthy fats called Omega-3 fatty acids are found in foods such as flaxseeds and coldwater fish, like sardines, salmon, and mackerel.  Limit how much you eat of the following: ? Canned or prepackaged foods. ? Food that is high in trans fat, such as fried foods. ? Food that is high in saturated fat, such as fatty meat. ? Sweets, desserts, sugary drinks, and other foods with added sugar. ? Full-fat dairy products.  Do not salt foods before eating.  Try to eat at least 2 vegetarian meals each week.  Eat more home-cooked food and less restaurant, buffet,  and fast food.  When eating at a restaurant, ask that your food be prepared with less salt or no salt, if possible. What foods are recommended? The items listed may not be a complete list. Talk with your dietitian about what dietary choices are best for you. Grains Whole-grain or whole-wheat bread. Whole-grain or whole-wheat pasta. Brown rice. [place]. Bulgur. Whole-grain and low-sodium cereals. Pita bread. Low-fat, low-sodium crackers. Whole-wheat flour tortillas. Vegetables Fresh or frozen vegetables (raw, steamed, roasted, or grilled). Low-sodium or  reduced-sodium tomato and vegetable juice. Low-sodium or reduced-sodium tomato sauce and tomato paste. Low-sodium or reduced-sodium canned vegetables. Fruits All fresh, dried, or frozen fruit. Canned fruit in natural juice (without added sugar). Meat and other protein foods Skinless chicken or [place]. Ground chicken or [place]. Pork with fat trimmed off. Fish and seafood. Egg whites. Dried beans, peas, or lentils. Unsalted nuts, nut butters, and seeds. Unsalted canned beans. Lean cuts of beef with fat trimmed off. Low-sodium, lean deli meat. Dairy Low-fat (1%) or fat-free (skim) milk. Fat-free, low-fat, or reduced-fat cheeses. Nonfat, low-sodium ricotta or cottage cheese. Low-fat or nonfat yogurt. Low-fat, low-sodium cheese. Fats and oils Soft margarine without trans fats. Vegetable oil. Low-fat, reduced-fat, or light mayonnaise and salad dressings (reduced-sodium). Canola, safflower, olive, soybean, and sunflower oils. Avocado. Seasoning and other foods Herbs. Spices. Seasoning mixes without salt. Unsalted popcorn and pretzels. Fat-free sweets. What foods are not recommended? The items listed may not be a complete list. Talk with your dietitian about what dietary choices are best for you. Grains Baked goods made with fat, such as croissants, muffins, or some breads. Dry pasta or rice meal packs. Vegetables Creamed or fried vegetables. Vegetables in a cheese sauce. Regular canned vegetables (not low-sodium or reduced-sodium). Regular canned tomato sauce and paste (not low-sodium or reduced-sodium). Regular tomato and vegetable juice (not low-sodium or reduced-sodium). Angie Fava. Olives. Fruits Canned fruit in a light or heavy syrup. Fried fruit. Fruit in cream or butter sauce. Meat and other protein foods Fatty cuts of meat. Ribs. Fried meat. Berniece Salines. Sausage. Bologna and other processed lunch meats. Salami. Fatback. Hotdogs. Bratwurst. Salted nuts and seeds. Canned beans with added salt. Canned or  smoked fish. Whole eggs or egg yolks. Chicken or [place] with skin. Dairy Whole or 2% milk, cream, and half-and-half. Whole or full-fat cream cheese. Whole-fat or sweetened yogurt. Full-fat cheese. Nondairy creamers. Whipped toppings. Processed cheese and cheese spreads. Fats and oils Butter. Stick margarine. Lard. Shortening. Ghee. Bacon fat. Tropical oils, such as coconut, palm kernel, or palm oil. Seasoning and other foods Salted popcorn and pretzels. Onion salt, garlic salt, seasoned salt, table salt, and sea salt. Worcestershire sauce. Tartar sauce. Barbecue sauce. Teriyaki sauce. Soy sauce, including reduced-sodium. Steak sauce. Canned and packaged gravies. Fish sauce. Oyster sauce. Cocktail sauce. Horseradish that you find on the shelf. Ketchup. Mustard. Meat flavorings and tenderizers. Bouillon cubes. Hot sauce and Tabasco sauce. Premade or packaged marinades. Premade or packaged taco seasonings. Relishes. Regular salad dressings. Where to find more information:  National Heart, Lung, and [place]: https://wilson-eaton.com/  American Heart Association: www.heart.org Summary  The DASH eating plan is a healthy eating plan that has been shown to reduce high blood pressure (hypertension). It may also reduce your risk for type 2 diabetes, heart disease, and stroke.  With the DASH eating plan, you should limit salt (sodium) intake to 2,300 mg a day. If you have hypertension, you may need to reduce your sodium intake to 1,500 mg a day.  When on  the DASH eating plan, aim to eat more fresh fruits and vegetables, whole grains, lean proteins, low-fat dairy, and heart-healthy fats.  Work with your health care provider or diet and nutrition specialist (dietitian) to adjust your eating plan to your individual calorie needs. This information is not intended to replace advice given to you by your health care provider. Make sure you discuss any questions you have with your health care provider. Document  Revised: 05/10/2017 Document Reviewed: 05/21/2016 Elsevier Patient Education  [place].    Tobacco Use Disorder Tobacco use disorder (TUD) occurs when a person craves, seeks, and uses tobacco, regardless of the consequences. This disorder can cause problems with mental and physical health. It can affect your ability to have healthy relationships, and it can keep you from meeting your responsibilities at work, home, or school. Tobacco may be:  Smoked as a cigarette or cigar.  Inhaled using e-cigarettes.  Smoked in a pipe or hookah.  Chewed as smokeless tobacco.  Inhaled into the nostrils as snuff. Tobacco products contain a dangerous chemical called nicotine, which is very addictive. Nicotine triggers hormones that make the body feel stimulated and works on areas of the brain that make you feel good. These effects can make it hard for people to quit nicotine. Tobacco contains many other unsafe chemicals that can damage almost every organ in the body. Smoking tobacco also puts others in danger due to fire risk and possible health problems caused by breathing in secondhand smoke. What are the signs or symptoms? Symptoms of TUD may include:  Being unable to slow down or stop your tobacco use.  Spending an abnormal amount of time getting or using tobacco.  Craving tobacco. Cravings may last for up to 6 months after quitting.  Tobacco use that: ? Interferes with your work, school, or home life. ? Interferes with your personal and social relationships. ? Makes you give up activities that you once enjoyed or found important.  Using tobacco even though you know that it is: ? Dangerous or bad for your health or someone else's health. ? Causing problems in your life.  Needing more and more of the substance to get the same effect (developing tolerance).  Experiencing unpleasant symptoms if you do not use the substance (withdrawal). Withdrawal symptoms may include: ? Depressed,  anxious, or irritable mood. ? Difficulty concentrating. ? Increased appetite. ? Restlessness or trouble sleeping.  Using the substance to avoid withdrawal. How is this diagnosed? This condition may be diagnosed based on:  Your current and past tobacco use. Your health care provider may ask questions about how your tobacco use affects your life.  A physical exam. You may be diagnosed with TUD if you have at least two symptoms within a 25-month period. How is this treated? This condition is treated by stopping tobacco use. Many people are unable to quit on their own and need help. Treatment may include:  Nicotine replacement therapy (NRT). NRT provides nicotine without the other harmful chemicals in tobacco. NRT gradually lowers the dosage of nicotine in the body and reduces withdrawal symptoms. NRT is available as: ? Over-the-counter gums, lozenges, and skin patches. ? Prescription mouth inhalers and nasal sprays.  Medicine that acts on the brain to reduce cravings and withdrawal symptoms.  A type of talk therapy that examines your triggers for tobacco use, how to avoid them, and how to cope with cravings (behavioral therapy).  Hypnosis. This may help with withdrawal symptoms.  Joining a support group for others  coping with TUD. The best treatment for TUD is usually a combination of medicine, talk therapy, and support groups. Recovery can be a long process. Many people start using tobacco again after stopping (relapse). If you relapse, it does not mean that treatment will not work. Follow these instructions at home:  Lifestyle  Do not use any products that contain nicotine or tobacco, such as cigarettes and e-cigarettes.  Avoid things that trigger tobacco use as much as you can. Triggers include people and situations that usually cause you to use tobacco.  Avoid drinks that contain caffeine, including coffee. These may worsen some withdrawal symptoms.  Find ways to manage stress.  Wanting to smoke may cause stress, and stress can make you want to smoke. Relaxation techniques such as deep breathing, meditation, and yoga may help.  Attend support groups as needed. These groups are an important part of long-term recovery for many people. General instructions  Take over-the-counter and prescription medicines only as told by your health care provider.  Check with your health care provider before taking any new prescription or over-the-counter medicines.  Decide on a friend, family member, or smoking quit-line (such as 1-800-QUIT-NOW in the [place] that you can call or text when you feel the urge to smoke or when you need help coping with cravings.  Keep all follow-up visits as told by your health care provider and therapist. This is important. Contact a health care provider if:  You are not able to take your medicines as prescribed.  Your symptoms get worse, even with treatment. Summary  Tobacco use disorder (TUD) occurs when a person craves, seeks, and uses tobacco regardless of the consequences.  This condition may be diagnosed based on your current and past tobacco use and a physical exam.  Many people are unable to quit on their own and need help. Recovery can be a long process.  The most effective treatment for TUD is usually a combination of medicine, talk therapy, and support groups. This information is not intended to replace advice given to you by your health care provider. Make sure you discuss any questions you have with your health care provider. Document Revised: 05/15/2017 Document Reviewed: 05/15/2017 Elsevier Patient Education  2020 [employer].

## 2019-10-10 LAB — CBC WITH DIFFERENTIAL/PLATELET
Absolute Monocytes: 917 cells/uL (ref 200–950)
Basophils Absolute: 93 cells/uL (ref 0–200)
Basophils Relative: 0.9 %
Eosinophils Absolute: 52 cells/uL (ref 15–500)
Eosinophils Relative: 0.5 %
HCT: 39.7 % (ref 38.5–50.0)
Hemoglobin: 13.1 g/dL — ABNORMAL LOW (ref 13.2–17.1)
Lymphs Abs: 2348 cells/uL (ref 850–3900)
MCH: 31.3 pg (ref 27.0–33.0)
MCHC: 33 g/dL (ref 32.0–36.0)
MCV: 94.7 fL (ref 80.0–100.0)
MPV: 11.3 fL (ref 7.5–12.5)
Monocytes Relative: 8.9 %
Neutro Abs: 6891 cells/uL (ref 1500–7800)
Neutrophils Relative %: 66.9 %
Platelets: 276 10*3/uL (ref 140–400)
RBC: 4.19 10*6/uL — ABNORMAL LOW (ref 4.20–5.80)
RDW: 11.8 % (ref 11.0–15.0)
Total Lymphocyte: 22.8 %
WBC: 10.3 10*3/uL (ref 3.8–10.8)

## 2019-10-10 LAB — COMPREHENSIVE METABOLIC PANEL
AG Ratio: 1.8 (calc) (ref 1.0–2.5)
ALT: 17 U/L (ref 9–46)
AST: 19 U/L (ref 10–40)
Albumin: 4.6 g/dL (ref 3.6–5.1)
Alkaline phosphatase (APISO): 43 U/L (ref 36–130)
BUN/Creatinine Ratio: 19 (calc) (ref 6–22)
BUN: 11 mg/dL (ref 7–25)
CO2: 24 mmol/L (ref 20–32)
Calcium: 9.9 mg/dL (ref 8.6–10.3)
Chloride: 104 mmol/L (ref 98–110)
Creat: 0.58 mg/dL — ABNORMAL LOW (ref 0.60–1.35)
Globulin: 2.6 g/dL (calc) (ref 1.9–3.7)
Glucose, Bld: 85 mg/dL (ref 65–99)
Potassium: 4.3 mmol/L (ref 3.5–5.3)
Sodium: 142 mmol/L (ref 135–146)
Total Bilirubin: 0.7 mg/dL (ref 0.2–1.2)
Total Protein: 7.2 g/dL (ref 6.1–8.1)

## 2019-10-10 LAB — VITAMIN D 25 HYDROXY (VIT D DEFICIENCY, FRACTURES): Vit D, 25-Hydroxy: 8 ng/mL — ABNORMAL LOW (ref 30–100)

## 2019-10-10 LAB — HEMOGLOBIN A1C
Hgb A1c MFr Bld: 4.5 % of total Hgb (ref <5.7)
Mean Plasma Glucose: 82 (calc)
eAG (mmol/L): 4.6 (calc)

## 2019-10-10 LAB — LIPID PANEL
Cholesterol: 197 mg/dL (ref <200)
HDL: 68 mg/dL (ref >=40)
LDL Cholesterol (Calc): 115 mg/dL (calc) — ABNORMAL HIGH
Non-HDL Cholesterol (Calc): 129 mg/dL (calc) (ref <130)
Total CHOL/HDL Ratio: 2.9 (calc) (ref <5.0)
Triglycerides: 55 mg/dL (ref <150)

## 2019-10-10 LAB — TSH: TSH: 1.08 mIU/L (ref 0.40–4.50)

## 2019-10-10 LAB — VITAMIN B12: Vitamin B-12: 525 pg/mL (ref 200–1100)

## 2019-10-13 ENCOUNTER — Other Ambulatory Visit: Payer: Self-pay | Admitting: Internal Medicine

## 2019-10-13 ENCOUNTER — Encounter: Payer: Self-pay | Admitting: Internal Medicine

## 2019-10-13 DIAGNOSIS — E559 Vitamin D deficiency, unspecified: Secondary | ICD-10-CM

## 2019-10-13 MED ORDER — VITAMIN D (ERGOCALCIFEROL) 1.25 MG (50000 UNIT) PO CAPS: 50000.0000 [IU] | ORAL_CAPSULE | ORAL | 0 refills | Status: AC

## 2019-10-14 ENCOUNTER — Other Ambulatory Visit: Payer: Self-pay | Admitting: Internal Medicine

## 2019-10-14 ENCOUNTER — Other Ambulatory Visit: Payer: Self-pay

## 2019-10-14 DIAGNOSIS — E559 Vitamin D deficiency, unspecified: Secondary | ICD-10-CM

## 2019-10-15 ENCOUNTER — Other Ambulatory Visit (INDEPENDENT_AMBULATORY_CARE_PROVIDER_SITE_OTHER): Payer: 59

## 2019-10-15 DIAGNOSIS — E559 Vitamin D deficiency, unspecified: Secondary | ICD-10-CM

## 2019-10-16 ENCOUNTER — Other Ambulatory Visit: Payer: Self-pay | Admitting: Internal Medicine

## 2019-10-16 DIAGNOSIS — E559 Vitamin D deficiency, unspecified: Secondary | ICD-10-CM

## 2019-10-16 LAB — VITAMIN D 25 HYDROXY (VIT D DEFICIENCY, FRACTURES): VITD: 10.02 ng/mL — ABNORMAL LOW (ref 30.00–100.00)

## 2019-11-23 ENCOUNTER — Other Ambulatory Visit: Payer: Self-pay

## 2019-11-24 ENCOUNTER — Encounter: Payer: Self-pay | Admitting: Internal Medicine

## 2019-11-24 ENCOUNTER — Ambulatory Visit (INDEPENDENT_AMBULATORY_CARE_PROVIDER_SITE_OTHER): Payer: 59 | Admitting: Internal Medicine

## 2019-11-24 VITALS — BP 140/90 | HR 76 | Temp 97.7°F | Wt 211.4 lb

## 2019-11-24 DIAGNOSIS — I10 Essential (primary) hypertension: Secondary | ICD-10-CM

## 2019-11-24 MED ORDER — HYDROCHLOROTHIAZIDE 25 MG PO TABS: 25.0000 mg | ORAL_TABLET | Freq: Every day | ORAL | 1 refills | Status: DC

## 2019-11-24 NOTE — Progress Notes (Signed)
Established Patient Office Visit     This visit occurred during the SARS-CoV-2 public health emergency.  Safety protocols were in place, including screening questions prior to the visit, additional usage of staff PPE, and extensive cleaning of exam room while observing appropriate contact time as indicated for disinfecting solutions.    CC/Reason for Visit: Blood pressure follow-up  HPI: Edwin [name] is a 39 y.o. male who is coming in today for the above mentioned reasons. Past Medical History is significant for: Hypertension, alcohol and tobacco abuse.  At last visit on April 30 he was started on amlodipine 10 mg and is here today for follow-up.  He has not been doing ambulatory blood pressure measurements, he has not really been following a low-salt diet.  At last visit his blood pressure was 160/100.  He denies chest pain, shortness of breath, headaches focal neurologic deficits.  He has no acute complaints today.   Past Medical/Surgical History: Past Medical History:  Diagnosis Date  . Alcohol abuse   . Hypertension   . Tobacco abuse     No past surgical history on file.  Social History:  reports that he has been smoking. He has been smoking about 1.00 pack per day. He has never used smokeless tobacco. He reports current alcohol use. He reports that he does not use drugs.  Allergies: No Known Allergies  Family History:  Family History  Problem Relation Age of Onset  . Hypertension Mother   . Hypertension Father      Current Outpatient Medications:  .  acetaminophen (TYLENOL) 325 MG tablet, Take 2 tablets (650 mg total) by mouth every 6 (six) hours as needed., Disp: 30 tablet, Rfl: 0 .  amLODipine (NORVASC) 10 MG tablet, Take 1 tablet (10 mg total) by mouth daily., Disp: 90 tablet, Rfl: 1 .  ibuprofen (ADVIL,MOTRIN) 800 MG tablet, Take 1 tablet (800 mg total) by mouth 3 (three) times daily. (Patient taking differently: Take 800 mg by mouth every 6 (six) hours  as needed. ), Disp: 21 tablet, Rfl: 0 .  Vitamin D, Ergocalciferol, (DRISDOL) 1.25 MG (50000 UNIT) CAPS capsule, Take 1 capsule (50,000 Units total) by mouth every 7 (seven) days for 12 doses., Disp: 12 capsule, Rfl: 0 .  hydrochlorothiazide (HYDRODIURIL) 25 MG tablet, Take 1 tablet (25 mg total) by mouth daily., Disp: 90 tablet, Rfl: 1  Review of Systems:  Constitutional: Denies fever, chills, diaphoresis, appetite change and fatigue.  HEENT: Denies photophobia, eye pain, redness, hearing loss, ear pain, congestion, sore throat, rhinorrhea, sneezing, mouth sores, trouble swallowing, neck pain, neck stiffness and tinnitus.   Respiratory: Denies SOB, DOE, cough, chest tightness,  and wheezing.   Cardiovascular: Denies chest pain, palpitations and leg swelling.  Gastrointestinal: Denies nausea, vomiting, abdominal pain, diarrhea, constipation, blood in stool and abdominal distention.  Genitourinary: Denies dysuria, urgency, frequency, hematuria, flank pain and difficulty urinating.  Endocrine: Denies: hot or cold intolerance, sweats, changes in hair or nails, polyuria, polydipsia. Musculoskeletal: Denies myalgias, back pain, joint swelling, arthralgias and gait problem.  Skin: Denies pallor, rash and wound.  Neurological: Denies dizziness, seizures, syncope, weakness, light-headedness, numbness and headaches.  Hematological: Denies adenopathy. Easy bruising, personal or family bleeding history  Psychiatric/Behavioral: Denies suicidal ideation, mood changes, confusion, nervousness, sleep disturbance and agitation    Physical Exam: Vitals:   11/24/19 1410  BP: 140/90  Pulse: 76  Temp: 97.7 F (36.5 C)  TempSrc: Temporal  SpO2: 96%  Weight: 211 lb 6.4 oz (95.9 kg)  Body mass index is 29.48 kg/m.   Constitutional: NAD, calm, comfortable Eyes: PERRL, lids and conjunctivae normal ENMT: Mucous membranes are moist.  Respiratory: clear to auscultation bilaterally, no wheezing, no  crackles. Normal respiratory effort. No accessory muscle use.  Cardiovascular: Regular rate and rhythm, no murmurs / rubs / gallops. No extremity edema. Neurologic: Grossly intact and nonfocal Psychiatric: Normal judgment and insight. Alert and oriented x 3. Normal mood.    Impression and Plan:  Essential hypertension -Not well controlled although improved from 6 weeks ago. -Add HCTZ 25 mg to amlodipine 10 mg. -Low-salt diet advised. -He will return in 6 weeks.    Patient Instructions  -Nice seeing you today!!  -Start HCTZ 25 mg daily.  Keep taking amlodipine 10 mg daily.  -Low salt diet (see below).  -See you back in 6 weeks.   DASH Eating Plan DASH stands for "Dietary Approaches to Stop Hypertension." The DASH eating plan is a healthy eating plan that has been shown to reduce high blood pressure (hypertension). It may also reduce your risk for type 2 diabetes, heart disease, and stroke. The DASH eating plan may also help with weight loss. What are tips for following this plan?  General guidelines  Avoid eating more than 2,300 mg (milligrams) of salt (sodium) a day. If you have hypertension, you may need to reduce your sodium intake to 1,500 mg a day.  Limit alcohol intake to no more than 1 drink a day for nonpregnant women and 2 drinks a day for men. One drink equals 12 oz of beer, 5 oz of wine, or 1 oz of hard liquor.  Work with your health care provider to maintain a healthy body weight or to lose weight. Ask what an ideal weight is for you.  Get at least 30 minutes of exercise that causes your heart to beat faster (aerobic exercise) most days of the week. Activities may include walking, swimming, or biking.  Work with your health care provider or diet and nutrition specialist (dietitian) to adjust your eating plan to your individual calorie needs. Reading food labels   Check food labels for the amount of sodium per serving. Choose foods with less than 5 percent of  the Daily Value of sodium. Generally, foods with less than 300 mg of sodium per serving fit into this eating plan.  To find whole grains, look for the word "whole" as the first word in the ingredient list. Shopping  Buy products labeled as "low-sodium" or "no salt added."  Buy fresh foods. Avoid canned foods and premade or frozen meals. Cooking  Avoid adding salt when cooking. Use salt-free seasonings or herbs instead of table salt or sea salt. Check with your health care provider or pharmacist before using salt substitutes.  Do not fry foods. Cook foods using healthy methods such as baking, boiling, grilling, and broiling instead.  Cook with heart-healthy oils, such as olive, canola, soybean, or sunflower oil. Meal planning  Eat a balanced diet that includes: ? 5 or more servings of fruits and vegetables each day. At each meal, try to fill half of your plate with fruits and vegetables. ? Up to 6-8 servings of whole grains each day. ? Less than 6 oz of lean meat, poultry, or fish each day. A 3-oz serving of meat is about the same size as a deck of cards. One egg equals 1 oz. ? 2 servings of low-fat dairy each day. ? A serving of nuts, seeds, or beans 5 times each  week. ? Heart-healthy fats. Healthy fats called Omega-3 fatty acids are found in foods such as flaxseeds and coldwater fish, like sardines, salmon, and mackerel.  Limit how much you eat of the following: ? Canned or prepackaged foods. ? Food that is high in trans fat, such as fried foods. ? Food that is high in saturated fat, such as fatty meat. ? Sweets, desserts, sugary drinks, and other foods with added sugar. ? Full-fat dairy products.  Do not salt foods before eating.  Try to eat at least 2 vegetarian meals each week.  Eat more home-cooked food and less restaurant, buffet, and fast food.  When eating at a restaurant, ask that your food be prepared with less salt or no salt, if possible. What foods are  recommended? The items listed may not be a complete list. Talk with your dietitian about what dietary choices are best for you. Grains Whole-grain or whole-wheat bread. Whole-grain or whole-wheat pasta. Brown rice. Orpah Cobb. Bulgur. Whole-grain and low-sodium cereals. Pita bread. Low-fat, low-sodium crackers. Whole-wheat flour tortillas. Vegetables Fresh or frozen vegetables (raw, steamed, roasted, or grilled). Low-sodium or reduced-sodium tomato and vegetable juice. Low-sodium or reduced-sodium tomato sauce and tomato paste. Low-sodium or reduced-sodium canned vegetables. Fruits All fresh, dried, or frozen fruit. Canned fruit in natural juice (without added sugar). Meat and other protein foods Skinless chicken or [place]. Ground chicken or [place]. Pork with fat trimmed off. Fish and seafood. Egg whites. Dried beans, peas, or lentils. Unsalted nuts, nut butters, and seeds. Unsalted canned beans. Lean cuts of beef with fat trimmed off. Low-sodium, lean deli meat. Dairy Low-fat (1%) or fat-free (skim) milk. Fat-free, low-fat, or reduced-fat cheeses. Nonfat, low-sodium ricotta or cottage cheese. Low-fat or nonfat yogurt. Low-fat, low-sodium cheese. Fats and oils Soft margarine without trans fats. Vegetable oil. Low-fat, reduced-fat, or light mayonnaise and salad dressings (reduced-sodium). Canola, safflower, olive, soybean, and sunflower oils. Avocado. Seasoning and other foods Herbs. Spices. Seasoning mixes without salt. Unsalted popcorn and pretzels. Fat-free sweets. What foods are not recommended? The items listed may not be a complete list. Talk with your dietitian about what dietary choices are best for you. Grains Baked goods made with fat, such as croissants, muffins, or some breads. Dry pasta or rice meal packs. Vegetables Creamed or fried vegetables. Vegetables in a cheese sauce. Regular canned vegetables (not low-sodium or reduced-sodium). Regular canned tomato sauce and paste (not  low-sodium or reduced-sodium). Regular tomato and vegetable juice (not low-sodium or reduced-sodium). Rosita Fire. Olives. Fruits Canned fruit in a light or heavy syrup. Fried fruit. Fruit in cream or butter sauce. Meat and other protein foods Fatty cuts of meat. Ribs. Fried meat. Tomasa Blase. Sausage. Bologna and other processed lunch meats. Salami. Fatback. Hotdogs. Bratwurst. Salted nuts and seeds. Canned beans with added salt. Canned or smoked fish. Whole eggs or egg yolks. Chicken or [place] with skin. Dairy Whole or 2% milk, cream, and half-and-half. Whole or full-fat cream cheese. Whole-fat or sweetened yogurt. Full-fat cheese. Nondairy creamers. Whipped toppings. Processed cheese and cheese spreads. Fats and oils Butter. Stick margarine. Lard. Shortening. Ghee. Bacon fat. Tropical oils, such as coconut, palm kernel, or palm oil. Seasoning and other foods Salted popcorn and pretzels. Onion salt, garlic salt, seasoned salt, table salt, and sea salt. Worcestershire sauce. Tartar sauce. Barbecue sauce. Teriyaki sauce. Soy sauce, including reduced-sodium. Steak sauce. Canned and packaged gravies. Fish sauce. Oyster sauce. Cocktail sauce. Horseradish that you find on the shelf. Ketchup. Mustard. Meat flavorings and tenderizers. Bouillon cubes. Hot sauce and  Tabasco sauce. Premade or packaged marinades. Premade or packaged taco seasonings. Relishes. Regular salad dressings. Where to find more information:  National Heart, Lung, and Blood Institute: PopSteam.is  American Heart Association: www.heart.org Summary  The DASH eating plan is a healthy eating plan that has been shown to reduce high blood pressure (hypertension). It may also reduce your risk for type 2 diabetes, heart disease, and stroke.  With the DASH eating plan, you should limit salt (sodium) intake to 2,300 mg a day. If you have hypertension, you may need to reduce your sodium intake to 1,500 mg a day.  When on the DASH eating plan,  aim to eat more fresh fruits and vegetables, whole grains, lean proteins, low-fat dairy, and heart-healthy fats.  Work with your health care provider or diet and nutrition specialist (dietitian) to adjust your eating plan to your individual calorie needs. This information is not intended to replace advice given to you by your health care provider. Make sure you discuss any questions you have with your health care provider. Document Revised: 05/10/2017 Document Reviewed: 05/21/2016 Elsevier Patient Education  2020 Elsevier Inc.      Chaya Jan, MD [facility]

## 2019-11-24 NOTE — Patient Instructions (Addendum)
-Nice seeing you today!!  -Start HCTZ 25 mg daily.  Keep taking amlodipine 10 mg daily.  -Low salt diet (see below).  -See you back in 6 weeks.   DASH Eating Plan DASH stands for "Dietary Approaches to Stop Hypertension." The DASH eating plan is a healthy eating plan that has been shown to reduce high blood pressure (hypertension). It may also reduce your risk for type 2 diabetes, heart disease, and stroke. The DASH eating plan may also help with weight loss. What are tips for following this plan?  General guidelines  Avoid eating more than 2,300 mg (milligrams) of salt (sodium) a day. If you have hypertension, you may need to reduce your sodium intake to 1,500 mg a day.  Limit alcohol intake to no more than 1 drink a day for nonpregnant women and 2 drinks a day for men. One drink equals 12 oz of beer, 5 oz of wine, or 1 oz of hard liquor.  Work with your health care provider to maintain a healthy body weight or to lose weight. Ask what an ideal weight is for you.  Get at least 30 minutes of exercise that causes your heart to beat faster (aerobic exercise) most days of the week. Activities may include walking, swimming, or biking.  Work with your health care provider or diet and nutrition specialist (dietitian) to adjust your eating plan to your individual calorie needs. Reading food labels   Check food labels for the amount of sodium per serving. Choose foods with less than 5 percent of the Daily Value of sodium. Generally, foods with less than 300 mg of sodium per serving fit into this eating plan.  To find whole grains, look for the word "whole" as the first word in the ingredient list. Shopping  Buy products labeled as "low-sodium" or "no salt added."  Buy fresh foods. Avoid canned foods and premade or frozen meals. Cooking  Avoid adding salt when cooking. Use salt-free seasonings or herbs instead of table salt or sea salt. Check with your health care provider or  pharmacist before using salt substitutes.  Do not fry foods. Cook foods using healthy methods such as baking, boiling, grilling, and broiling instead.  Cook with heart-healthy oils, such as olive, canola, soybean, or sunflower oil. Meal planning  Eat a balanced diet that includes: ? 5 or more servings of fruits and vegetables each day. At each meal, try to fill half of your plate with fruits and vegetables. ? Up to 6-8 servings of whole grains each day. ? Less than 6 oz of lean meat, poultry, or fish each day. A 3-oz serving of meat is about the same size as a deck of cards. One egg equals 1 oz. ? 2 servings of low-fat dairy each day. ? A serving of nuts, seeds, or beans 5 times each week. ? Heart-healthy fats. Healthy fats called Omega-3 fatty acids are found in foods such as flaxseeds and coldwater fish, like sardines, salmon, and mackerel.  Limit how much you eat of the following: ? Canned or prepackaged foods. ? Food that is high in trans fat, such as fried foods. ? Food that is high in saturated fat, such as fatty meat. ? Sweets, desserts, sugary drinks, and other foods with added sugar. ? Full-fat dairy products.  Do not salt foods before eating.  Try to eat at least 2 vegetarian meals each week.  Eat more home-cooked food and less restaurant, buffet, and fast food.  When eating at a restaurant,  ask that your food be prepared with less salt or no salt, if possible. What foods are recommended? The items listed may not be a complete list. Talk with your dietitian about what dietary choices are best for you. Grains Whole-grain or whole-wheat bread. Whole-grain or whole-wheat pasta. Brown rice. Modena Morrow. Bulgur. Whole-grain and low-sodium cereals. Pita bread. Low-fat, low-sodium crackers. Whole-wheat flour tortillas. Vegetables Fresh or frozen vegetables (raw, steamed, roasted, or grilled). Low-sodium or reduced-sodium tomato and vegetable juice. Low-sodium or  reduced-sodium tomato sauce and tomato paste. Low-sodium or reduced-sodium canned vegetables. Fruits All fresh, dried, or frozen fruit. Canned fruit in natural juice (without added sugar). Meat and other protein foods Skinless chicken or [place]. Ground chicken or [place]. Pork with fat trimmed off. Fish and seafood. Egg whites. Dried beans, peas, or lentils. Unsalted nuts, nut butters, and seeds. Unsalted canned beans. Lean cuts of beef with fat trimmed off. Low-sodium, lean deli meat. Dairy Low-fat (1%) or fat-free (skim) milk. Fat-free, low-fat, or reduced-fat cheeses. Nonfat, low-sodium ricotta or cottage cheese. Low-fat or nonfat yogurt. Low-fat, low-sodium cheese. Fats and oils Soft margarine without trans fats. Vegetable oil. Low-fat, reduced-fat, or light mayonnaise and salad dressings (reduced-sodium). Canola, safflower, olive, soybean, and sunflower oils. Avocado. Seasoning and other foods Herbs. Spices. Seasoning mixes without salt. Unsalted popcorn and pretzels. Fat-free sweets. What foods are not recommended? The items listed may not be a complete list. Talk with your dietitian about what dietary choices are best for you. Grains Baked goods made with fat, such as croissants, muffins, or some breads. Dry pasta or rice meal packs. Vegetables Creamed or fried vegetables. Vegetables in a cheese sauce. Regular canned vegetables (not low-sodium or reduced-sodium). Regular canned tomato sauce and paste (not low-sodium or reduced-sodium). Regular tomato and vegetable juice (not low-sodium or reduced-sodium). Angie Fava. Olives. Fruits Canned fruit in a light or heavy syrup. Fried fruit. Fruit in cream or butter sauce. Meat and other protein foods Fatty cuts of meat. Ribs. Fried meat. Berniece Salines. Sausage. Bologna and other processed lunch meats. Salami. Fatback. Hotdogs. Bratwurst. Salted nuts and seeds. Canned beans with added salt. Canned or smoked fish. Whole eggs or egg yolks. Chicken or [place]  with skin. Dairy Whole or 2% milk, cream, and half-and-half. Whole or full-fat cream cheese. Whole-fat or sweetened yogurt. Full-fat cheese. Nondairy creamers. Whipped toppings. Processed cheese and cheese spreads. Fats and oils Butter. Stick margarine. Lard. Shortening. Ghee. Bacon fat. Tropical oils, such as coconut, palm kernel, or palm oil. Seasoning and other foods Salted popcorn and pretzels. Onion salt, garlic salt, seasoned salt, table salt, and sea salt. Worcestershire sauce. Tartar sauce. Barbecue sauce. Teriyaki sauce. Soy sauce, including reduced-sodium. Steak sauce. Canned and packaged gravies. Fish sauce. Oyster sauce. Cocktail sauce. Horseradish that you find on the shelf. Ketchup. Mustard. Meat flavorings and tenderizers. Bouillon cubes. Hot sauce and Tabasco sauce. Premade or packaged marinades. Premade or packaged taco seasonings. Relishes. Regular salad dressings. Where to find more information:  National Heart, Lung, and [place]: https://wilson-eaton.com/  American Heart Association: www.heart.org Summary  The DASH eating plan is a healthy eating plan that has been shown to reduce high blood pressure (hypertension). It may also reduce your risk for type 2 diabetes, heart disease, and stroke.  With the DASH eating plan, you should limit salt (sodium) intake to 2,300 mg a day. If you have hypertension, you may need to reduce your sodium intake to 1,500 mg a day.  When on the DASH eating plan, aim to eat more fresh  fruits and vegetables, whole grains, lean proteins, low-fat dairy, and heart-healthy fats.  Work with your health care provider or diet and nutrition specialist (dietitian) to adjust your eating plan to your individual calorie needs. This information is not intended to replace advice given to you by your health care provider. Make sure you discuss any questions you have with your health care provider. Document Revised: 05/10/2017 Document Reviewed:  05/21/2016 Elsevier Patient Education  2020 [employer].

## 2020-01-13 ENCOUNTER — Ambulatory Visit: Payer: 59 | Admitting: Internal Medicine

## 2020-01-13 DIAGNOSIS — Z0289 Encounter for other administrative examinations: Secondary | ICD-10-CM

## 2020-04-06 ENCOUNTER — Ambulatory Visit (INDEPENDENT_AMBULATORY_CARE_PROVIDER_SITE_OTHER): Payer: 59 | Admitting: Family Medicine

## 2020-04-06 ENCOUNTER — Encounter: Payer: Self-pay | Admitting: Family Medicine

## 2020-04-06 ENCOUNTER — Other Ambulatory Visit: Payer: Self-pay

## 2020-04-06 VITALS — BP 158/96 | HR 80 | Temp 98.9°F | Wt 215.6 lb

## 2020-04-06 DIAGNOSIS — M545 Low back pain, unspecified: Secondary | ICD-10-CM | POA: Diagnosis not present

## 2020-04-06 DIAGNOSIS — I10 Essential (primary) hypertension: Secondary | ICD-10-CM | POA: Diagnosis not present

## 2020-04-06 LAB — POCT URINALYSIS DIPSTICK
Bilirubin, UA: NEGATIVE
Blood, UA: NEGATIVE
Glucose, UA: NEGATIVE
Ketones, UA: NEGATIVE
Leukocytes, UA: NEGATIVE
Nitrite, UA: NEGATIVE
Protein, UA: POSITIVE — AB
Spec Grav, UA: 1.02 (ref 1.010–1.025)
Urobilinogen, UA: 0.2 E.U./dL
pH, UA: 6 (ref 5.0–8.0)

## 2020-04-06 MED ORDER — CYCLOBENZAPRINE HCL 5 MG PO TABS: 5.0000 mg | ORAL_TABLET | Freq: Every day | ORAL | 0 refills | Status: DC

## 2020-04-06 NOTE — Progress Notes (Signed)
Subjective:    Patient ID: Edwin [name], male    DOB: 12/07/1980, 39 y.o.   MRN: [id number]  No chief complaint on file.   HPI Pt is a 39 yo male with pmh sig for HTN, EtOH abuse, nictoine abuse, and vit d def followed by Dr. Ardyth Harps seen for acute concern.  Patient endorses right-sided back pain x3 days.  Patient notes sharp pain when he woke up on Monday morning.  Pt not driving for symptoms.  Patient denies headache, fever, chills, nausea, vomiting, dysuria, hematuria, heavy lifting, pushing/pulling.  Pt on HCTZ 25 mg and norvasc 10 mg for HTN.  Pt states he often forgets to take his meds.  Pt may eat fast food twice a wk. and eats a lot of pork.  Pt drinking ten 8 oz cups of water while at work.  Past Medical History:  Diagnosis Date   Alcohol abuse    Hypertension    Tobacco abuse     No Known Allergies  ROS General: Denies fever, chills, night sweats, changes in weight, changes in appetite HEENT: Denies headaches, ear pain, changes in vision, rhinorrhea, sore throat CV: Denies CP, palpitations, SOB, orthopnea Pulm: Denies SOB, cough, wheezing GI: Denies abdominal pain, nausea, vomiting, diarrhea, constipation GU: Denies dysuria, hematuria, frequency, vaginal discharge Msk: Denies muscle cramps, joint pains  +back pain Neuro: Denies weakness, numbness, tingling Skin: Denies rashes, bruising Psych: Denies depression, anxiety, hallucinations     Objective:    Blood pressure (!) 158/96, pulse 80, temperature 98.9 F (37.2 C), temperature source Oral, weight 215 lb 9.6 oz (97.8 kg), SpO2 97 %.   Gen. Pleasant, well-nourished, in no distress, normal affect   HEENT: [place]/AT, face symmetric, conjunctiva clear, no scleral icterus, PERRLA, EOMI, nares patent without drainage Lungs: no accessory muscle use Cardiovascular: RRR, no m/r/g, no peripheral edema Musculoskeletal: TTP on lumbar spine and R lumbar paraspinal muscles.  No deformities, no cyanosis or clubbing,  normal tone Neuro:  A&Ox3, CN II-XII intact, normal gait Skin:  Warm, no lesions/ rash   Wt Readings from Last 3 Encounters:  04/06/20 215 lb 9.6 oz (97.8 kg)  11/24/19 211 lb 6.4 oz (95.9 kg)  10/09/19 205 lb 8 oz (93.2 kg)    Lab Results  Component Value Date   WBC 10.3 10/09/2019   HGB 13.1 (L) 10/09/2019   HCT 39.7 10/09/2019   PLT 276 10/09/2019   GLUCOSE 85 10/09/2019   CHOL 197 10/09/2019   TRIG 55 10/09/2019   HDL 68 10/09/2019   LDLCALC 115 (H) 10/09/2019   ALT 17 10/09/2019   AST 19 10/09/2019   NA 142 10/09/2019   K 4.3 10/09/2019   CL 104 10/09/2019   CREATININE 0.58 (L) 10/09/2019   BUN 11 10/09/2019   CO2 24 10/09/2019   TSH 1.08 10/09/2019   HGBA1C 4.5 10/09/2019    Assessment/Plan:  Acute right-sided low back pain without sciatica  -Symptoms likely 2/2 muscle strain.  Also consider renal calculi- given precautions for changes in symptoms -UA with SG 1.020, protein -Discussed supportive care including heat, massage, stretching, NSAIDs sparingly given history of HTN, topical analgesics such as Flexeril -given handout - Plan: POCT urinalysis dipstick, cyclobenzaprine (FLEXERIL) 5 MG tablet  Essential hypertension -Uncontrolled -Patient encouraged to take medications including Norvasc 10 mg and hydrochlorothiazide 25 mg daily consistently -Discussed lifestyle modifications including reducing sodium intake -Patient encouraged to check BP at home  F/u as needed in the next 2 weeks  Abbe Amsterdam, MD

## 2020-04-06 NOTE — Patient Instructions (Signed)
Acute Back Pain, Adult Acute back pain is sudden and usually short-lived. It is often caused by an injury to the muscles and tissues in the back. The injury may result from:  A muscle or ligament getting overstretched or torn (strained). Ligaments are tissues that connect bones to each other. Lifting something improperly can cause a back strain.  Wear and tear (degeneration) of the spinal disks. Spinal disks are circular tissue that provides cushioning between the bones of the spine (vertebrae).  Twisting motions, such as while playing sports or doing yard work.  A hit to the back.  Arthritis. You may have a physical exam, lab tests, and imaging tests to find the cause of your pain. Acute back pain usually goes away with rest and home care. Follow these instructions at home: Managing pain, stiffness, and swelling  Take over-the-counter and prescription medicines only as told by your health care provider.  Your health care provider may recommend applying ice during the first 24-48 hours after your pain starts. To do this: ? Put ice in a plastic bag. ? Place a towel between your skin and the bag. ? Leave the ice on for 20 minutes, 2-3 times a day.  If directed, apply heat to the affected area as often as told by your health care provider. Use the heat source that your health care provider recommends, such as a moist heat pack or a heating pad. ? Place a towel between your skin and the heat source. ? Leave the heat on for 20-30 minutes. ? Remove the heat if your skin turns bright red. This is especially important if you are unable to feel pain, heat, or cold. You have a greater risk of getting burned. Activity   Do not stay in bed. Staying in bed for more than 1-2 days can delay your recovery.  Sit up and stand up straight. Avoid leaning forward when you sit, or hunching over when you stand. ? If you work at a desk, sit close to it so you do not need to lean over. Keep your chin tucked  in. Keep your neck drawn back, and keep your elbows bent at a right angle. Your arms should look like the letter "L." ? Sit high and close to the steering wheel when you drive. Add lower back (lumbar) support to your car seat, if needed.  Take short walks on even surfaces as soon as you are able. Try to increase the length of time you walk each day.  Do not sit, drive, or stand in one place for more than 30 minutes at a time. Sitting or standing for long periods of time can put stress on your back.  Do not drive or use heavy machinery while taking prescription pain medicine.  Use proper lifting techniques. When you bend and lift, use positions that put less stress on your back: ? Bend your knees. ? Keep the load close to your body. ? Avoid twisting.  Exercise regularly as told by your health care provider. Exercising helps your back heal faster and helps prevent back injuries by keeping muscles strong and flexible.  Work with a physical therapist to make a safe exercise program, as recommended by your health care provider. Do any exercises as told by your physical therapist. Lifestyle  Maintain a healthy weight. Extra weight puts stress on your back and makes it difficult to have good posture.  Avoid activities or situations that make you feel anxious or stressed. Stress and anxiety increase muscle   tension and can make back pain worse. Learn ways to manage anxiety and stress, such as through exercise. General instructions  Sleep on a firm mattress in a comfortable position. Try lying on your side with your knees slightly bent. If you lie on your back, put a pillow under your knees.  Follow your treatment plan as told by your health care provider. This may include: ? Cognitive or behavioral therapy. ? Acupuncture or massage therapy. ? Meditation or yoga. Contact a health care provider if:  You have pain that is not relieved with rest or medicine.  You have increasing pain going down  into your legs or buttocks.  Your pain does not improve after 2 weeks.  You have pain at night.  You lose weight without trying.  You have a fever or chills. Get help right away if:  You develop new bowel or bladder control problems.  You have unusual weakness or numbness in your arms or legs.  You develop nausea or vomiting.  You develop abdominal pain.  You feel faint. Summary  Acute back pain is sudden and usually short-lived.  Use proper lifting techniques. When you bend and lift, use positions that put less stress on your back.  Take over-the-counter and prescription medicines and apply heat or ice as directed by your health care provider. This information is not intended to replace advice given to you by your health care provider. Make sure you discuss any questions you have with your health care provider. Document Revised: 09/16/2018 Document Reviewed: 01/09/2017 Elsevier Patient Education  [place].  Back Exercises These exercises help to make your trunk and back strong. They also help to keep the lower back flexible. Doing these exercises can help to prevent back pain or lessen existing pain.  If you have back pain, try to do these exercises 2-3 times each day or as told by your doctor.  As you get better, do the exercises once each day. Repeat the exercises more often as told by your doctor.  To stop back pain from coming back, do the exercises once each day, or as told by your doctor. Exercises Single knee to chest Do these steps 3-5 times in a row for each leg: 1. Lie on your back on a firm bed or the floor with your legs stretched out. 2. Bring one knee to your chest. 3. Grab your knee or thigh with both hands and hold them it in place. 4. Pull on your knee until you feel a gentle stretch in your lower back or buttocks. 5. Keep doing the stretch for 10-30 seconds. 6. Slowly let go of your leg and straighten it. Pelvic tilt Do these steps 5-10  times in a row: 1. Lie on your back on a firm bed or the floor with your legs stretched out. 2. Bend your knees so they point up to the ceiling. Your feet should be flat on the floor. 3. Tighten your lower belly (abdomen) muscles to press your lower back against the floor. This will make your tailbone point up to the ceiling instead of pointing down to your feet or the floor. 4. Stay in this position for 5-10 seconds while you gently tighten your muscles and breathe evenly. Cat-cow Do these steps until your lower back bends more easily: 1. Get on your hands and knees on a firm surface. Keep your hands under your shoulders, and keep your knees under your hips. You may put padding under your knees. 2. Let  your head hang down toward your chest. Tighten (contract) the muscles in your belly. Point your tailbone toward the floor so your lower back becomes rounded like the back of a cat. 3. Stay in this position for 5 seconds. 4. Slowly lift your head. Let the muscles of your belly relax. Point your tailbone up toward the ceiling so your back forms a sagging arch like the back of a cow. 5. Stay in this position for 5 seconds.  Press-ups Do these steps 5-10 times in a row: 1. Lie on your belly (face-down) on the floor. 2. Place your hands near your head, about shoulder-width apart. 3. While you keep your back relaxed and keep your hips on the floor, slowly straighten your arms to raise the top half of your body and lift your shoulders. Do not use your back muscles. You may change where you place your hands in order to make yourself more comfortable. 4. Stay in this position for 5 seconds. 5. Slowly return to lying flat on the floor.  Bridges Do these steps 10 times in a row: 1. Lie on your back on a firm surface. 2. Bend your knees so they point up to the ceiling. Your feet should be flat on the floor. Your arms should be flat at your sides, next to your body. 3. Tighten your butt muscles and lift  your butt off the floor until your waist is almost as high as your knees. If you do not feel the muscles working in your butt and the back of your thighs, slide your feet 1-2 inches farther away from your butt. 4. Stay in this position for 3-5 seconds. 5. Slowly lower your butt to the floor, and let your butt muscles relax. If this exercise is too easy, try doing it with your arms crossed over your chest. Belly crunches Do these steps 5-10 times in a row: 1. Lie on your back on a firm bed or the floor with your legs stretched out. 2. Bend your knees so they point up to the ceiling. Your feet should be flat on the floor. 3. Cross your arms over your chest. 4. Tip your chin a little bit toward your chest but do not bend your neck. 5. Tighten your belly muscles and slowly raise your chest just enough to lift your shoulder blades a tiny bit off of the floor. Avoid raising your body higher than that, because it can put too much stress on your low back. 6. Slowly lower your chest and your head to the floor. Back lifts Do these steps 5-10 times in a row: 1. Lie on your belly (face-down) with your arms at your sides, and rest your forehead on the floor. 2. Tighten the muscles in your legs and your butt. 3. Slowly lift your chest off of the floor while you keep your hips on the floor. Keep the back of your head in line with the curve in your back. Look at the floor while you do this. 4. Stay in this position for 3-5 seconds. 5. Slowly lower your chest and your face to the floor. Contact a doctor if:  Your back pain gets a lot worse when you do an exercise.  Your back pain does not get better 2 hours after you exercise. If you have any of these problems, stop doing the exercises. Do not do them again unless your doctor says it is okay. Get help right away if:  You have sudden, very bad back pain.   If this happens, stop doing the exercises. Do not do them again unless your doctor says it is  okay. This information is not intended to replace advice given to you by your health care provider. Make sure you discuss any questions you have with your health care provider. Document Revised: 02/20/2018 Document Reviewed: 02/20/2018 Elsevier Patient Education  2020 [employer].  Managing Your Hypertension Hypertension is commonly called high blood pressure. This is when the force of your blood pressing against the walls of your arteries is too strong. Arteries are blood vessels that carry blood from your heart throughout your body. Hypertension forces the heart to work harder to pump blood, and may cause the arteries to become narrow or stiff. Having untreated or uncontrolled hypertension can cause heart attack, stroke, kidney disease, and other problems. What are blood pressure readings? A blood pressure reading consists of a higher number over a lower number. Ideally, your blood pressure should be below 120/80. The first ("top") number is called the systolic pressure. It is a measure of the pressure in your arteries as your heart beats. The second ("bottom") number is called the diastolic pressure. It is a measure of the pressure in your arteries as the heart relaxes. What does my blood pressure reading mean? Blood pressure is classified into four stages. Based on your blood pressure reading, your health care provider may use the following stages to determine what type of treatment you need, if any. Systolic pressure and diastolic pressure are measured in a unit called mm Hg. Normal  Systolic pressure: below 120.  Diastolic pressure: below 80. Elevated  Systolic pressure: 120-129.  Diastolic pressure: below 80. Hypertension stage 1  Systolic pressure: 130-139.  Diastolic pressure: 80-89. Hypertension stage 2  Systolic pressure: 140 or above.  Diastolic pressure: 90 or above. What health risks are associated with hypertension? Managing your hypertension is an important  responsibility. Uncontrolled hypertension can lead to:  A heart attack.  A stroke.  A weakened blood vessel (aneurysm).  Heart failure.  Kidney damage.  Eye damage.  Metabolic syndrome.  Memory and concentration problems. What changes can I make to manage my hypertension? Hypertension can be managed by making lifestyle changes and possibly by taking medicines. Your health care provider will help you make a plan to bring your blood pressure within a normal range. Eating and drinking   Eat a diet that is high in fiber and potassium, and low in salt (sodium), added sugar, and fat. An example eating plan is called the DASH (Dietary Approaches to Stop Hypertension) diet. To eat this way: ? Eat plenty of fresh fruits and vegetables. Try to fill half of your plate at each meal with fruits and vegetables. ? Eat whole grains, such as whole wheat pasta, brown rice, or whole grain bread. Fill about one quarter of your plate with whole grains. ? Eat low-fat diary products. ? Avoid fatty cuts of meat, processed or cured meats, and poultry with skin. Fill about one quarter of your plate with lean proteins such as fish, chicken without skin, beans, eggs, and tofu. ? Avoid premade and processed foods. These tend to be higher in sodium, added sugar, and fat.  Reduce your daily sodium intake. Most people with hypertension should eat less than 1,500 mg of sodium a day.  Limit alcohol intake to no more than 1 drink a day for nonpregnant women and 2 drinks a day for men. One drink equals 12 oz of beer, 5 oz of wine, or  1 oz of hard liquor. Lifestyle  Work with your health care provider to maintain a healthy body weight, or to lose weight. Ask what an ideal weight is for you.  Get at least 30 minutes of exercise that causes your heart to beat faster (aerobic exercise) most days of the week. Activities may include walking, swimming, or biking.  Include exercise to strengthen your muscles (resistance  exercise), such as weight lifting, as part of your weekly exercise routine. Try to do these types of exercises for 30 minutes at least 3 days a week.  Do not use any products that contain nicotine or tobacco, such as cigarettes and e-cigarettes. If you need help quitting, ask your health care provider.  Control any long-term (chronic) conditions you have, such as high cholesterol or diabetes. Monitoring  Monitor your blood pressure at home as told by your health care provider. Your personal target blood pressure may vary depending on your medical conditions, your age, and other factors.  Have your blood pressure checked regularly, as often as told by your health care provider. Working with your health care provider  Review all the medicines you take with your health care provider because there may be side effects or interactions.  Talk with your health care provider about your diet, exercise habits, and other lifestyle factors that may be contributing to hypertension.  Visit your health care provider regularly. Your health care provider can help you create and adjust your plan for managing hypertension. Will I need medicine to control my blood pressure? Your health care provider may prescribe medicine if lifestyle changes are not enough to get your blood pressure under control, and if:  Your systolic blood pressure is 130 or higher.  Your diastolic blood pressure is 80 or higher. Take medicines only as told by your health care provider. Follow the directions carefully. Blood pressure medicines must be taken as prescribed. The medicine does not work as well when you skip doses. Skipping doses also puts you at risk for problems. Contact a health care provider if:  You think you are having a reaction to medicines you have taken.  You have repeated (recurrent) headaches.  You feel dizzy.  You have swelling in your ankles.  You have trouble with your vision. Get help right away  if:  You develop a severe headache or confusion.  You have unusual weakness or numbness, or you feel faint.  You have severe pain in your chest or abdomen.  You vomit repeatedly.  You have trouble breathing. Summary  Hypertension is when the force of blood pumping through your arteries is too strong. If this condition is not controlled, it may put you at risk for serious complications.  Your personal target blood pressure may vary depending on your medical conditions, your age, and other factors. For most people, a normal blood pressure is less than 120/80.  Hypertension is managed by lifestyle changes, medicines, or both. Lifestyle changes include weight loss, eating a healthy, low-sodium diet, exercising more, and limiting alcohol. This information is not intended to replace advice given to you by your health care provider. Make sure you discuss any questions you have with your health care provider. Document Revised: 09/19/2018 Document Reviewed: 04/25/2016 Elsevier Patient Education  2020 [employer].

## 2020-07-05 ENCOUNTER — Ambulatory Visit (INDEPENDENT_AMBULATORY_CARE_PROVIDER_SITE_OTHER): Payer: 59 | Admitting: Internal Medicine

## 2020-07-05 ENCOUNTER — Other Ambulatory Visit: Payer: Self-pay

## 2020-07-05 VITALS — BP 150/100 | HR 80 | Temp 98.3°F | Ht 72.0 in | Wt 215.4 lb

## 2020-07-05 DIAGNOSIS — F1010 Alcohol abuse, uncomplicated: Secondary | ICD-10-CM | POA: Diagnosis not present

## 2020-07-05 DIAGNOSIS — Z0000 Encounter for general adult medical examination without abnormal findings: Secondary | ICD-10-CM

## 2020-07-05 DIAGNOSIS — F17210 Nicotine dependence, cigarettes, uncomplicated: Secondary | ICD-10-CM | POA: Diagnosis not present

## 2020-07-05 DIAGNOSIS — E559 Vitamin D deficiency, unspecified: Secondary | ICD-10-CM | POA: Diagnosis not present

## 2020-07-05 DIAGNOSIS — I10 Essential (primary) hypertension: Secondary | ICD-10-CM | POA: Diagnosis not present

## 2020-07-05 DIAGNOSIS — Z23 Encounter for immunization: Secondary | ICD-10-CM

## 2020-07-05 DIAGNOSIS — K219 Gastro-esophageal reflux disease without esophagitis: Secondary | ICD-10-CM

## 2020-07-05 LAB — COMPREHENSIVE METABOLIC PANEL
ALT: 15 U/L (ref 0–53)
AST: 17 U/L (ref 0–37)
Albumin: 4.7 g/dL (ref 3.5–5.2)
Alkaline Phosphatase: 34 U/L — ABNORMAL LOW (ref 39–117)
BUN: 11 mg/dL (ref 6–23)
CO2: 28 mEq/L (ref 19–32)
Calcium: 9.8 mg/dL (ref 8.4–10.5)
Chloride: 103 mEq/L (ref 96–112)
Creatinine, Ser: 0.69 mg/dL (ref 0.40–1.50)
GFR: 117.91 mL/min (ref >60.00)
Glucose, Bld: 86 mg/dL (ref 70–99)
Potassium: 4.3 mEq/L (ref 3.5–5.1)
Sodium: 139 mEq/L (ref 135–145)
Total Bilirubin: 0.4 mg/dL (ref 0.2–1.2)
Total Protein: 7.1 g/dL (ref 6.0–8.3)

## 2020-07-05 LAB — CBC WITH DIFFERENTIAL/PLATELET
Basophils Absolute: 0.1 10*3/uL (ref 0.0–0.1)
Basophils Relative: 1 % (ref 0.0–3.0)
Eosinophils Absolute: 0 10*3/uL (ref 0.0–0.7)
Eosinophils Relative: 0.4 % (ref 0.0–5.0)
HCT: 39.3 % (ref 39.0–52.0)
Hemoglobin: 13.3 g/dL (ref 13.0–17.0)
Lymphocytes Relative: 25 % (ref 12.0–46.0)
Lymphs Abs: 2 10*3/uL (ref 0.7–4.0)
MCHC: 33.9 g/dL (ref 30.0–36.0)
MCV: 92.9 fl (ref 78.0–100.0)
Monocytes Absolute: 0.7 10*3/uL (ref 0.1–1.0)
Monocytes Relative: 9.3 % (ref 3.0–12.0)
Neutro Abs: 5.1 10*3/uL (ref 1.4–7.7)
Neutrophils Relative %: 64.3 % (ref 43.0–77.0)
Platelets: 228 10*3/uL (ref 150.0–400.0)
RBC: 4.23 Mil/uL (ref 4.22–5.81)
RDW: 13.7 % (ref 11.5–15.5)
WBC: 8 10*3/uL (ref 4.0–10.5)

## 2020-07-05 LAB — LIPID PANEL
Cholesterol: 219 mg/dL — ABNORMAL HIGH (ref 0–200)
HDL: 64.4 mg/dL (ref >39.00)
LDL Cholesterol: 141 mg/dL — ABNORMAL HIGH (ref 0–99)
NonHDL: 154.55
Total CHOL/HDL Ratio: 3
Triglycerides: 68 mg/dL (ref 0.0–149.0)
VLDL: 13.6 mg/dL (ref 0.0–40.0)

## 2020-07-05 LAB — VITAMIN B12: Vitamin B-12: 365 pg/mL (ref 211–911)

## 2020-07-05 LAB — VITAMIN D 25 HYDROXY (VIT D DEFICIENCY, FRACTURES): VITD: 11.95 ng/mL — ABNORMAL LOW (ref 30.00–100.00)

## 2020-07-05 LAB — TSH: TSH: 1.38 u[IU]/mL (ref 0.35–4.50)

## 2020-07-05 LAB — HEMOGLOBIN A1C: Hgb A1c MFr Bld: 4.8 % (ref 4.6–6.5)

## 2020-07-05 MED ORDER — PANTOPRAZOLE SODIUM 40 MG PO TBEC: 40.0000 mg | DELAYED_RELEASE_TABLET | Freq: Every day | ORAL | 1 refills | Status: DC

## 2020-07-05 NOTE — Patient Instructions (Signed)
-Nice seeing you today!!  -Lab work today; will notify you once results are available.  -Flu and tetanus vaccines today.  -Remember your COVID vaccines at the pharmacy.  -Start protonix 40 mg daily for your reflux.  -Schedule follow up in 8 weeks for your blood pressure.   Preventive Care 69-38 Years Old, Male Preventive care refers to lifestyle choices and visits with your health care provider that can promote health and wellness. This includes:  A yearly physical exam. This is also called an annual wellness visit.  Regular dental and eye exams.  Immunizations.  Screening for certain conditions.  Healthy lifestyle choices, such as: ? Eating a healthy diet. ? Getting regular exercise. ? Not using drugs or products that contain nicotine and tobacco. ? Limiting alcohol use. What can I expect for my preventive care visit? Physical exam Your health care provider may check your:  Height and weight. These may be used to calculate your BMI (body mass index). BMI is a measurement that tells if you are at a healthy weight.  Heart rate and blood pressure.  Body temperature.  Skin for abnormal spots. Counseling Your health care provider may ask you questions about your:  Past medical problems.  Family's medical history.  Alcohol, tobacco, and drug use.  Emotional well-being.  Home life and relationship well-being.  Sexual activity.  Diet, exercise, and sleep habits.  Work and work Astronomer.  Access to firearms. What immunizations do I need? Vaccines are usually given at various ages, according to a schedule. Your health care provider will recommend vaccines for you based on your age, medical history, and lifestyle or other factors, such as travel or where you work.   What tests do I need? Blood tests  Lipid and cholesterol levels. These may be checked every 5 years starting at age 12.  Hepatitis C test.  Hepatitis B test. Screening  Diabetes screening.  This is done by checking your blood sugar (glucose) after you have not eaten for a while (fasting).  Genital exam to check for testicular cancer or hernias.  STD (sexually transmitted disease) testing, if you are at risk. Talk with your health care provider about your test results, treatment options, and if necessary, the need for more tests.   Follow these instructions at home: Eating and drinking  Eat a healthy diet that includes fresh fruits and vegetables, whole grains, lean protein, and low-fat dairy products.  Drink enough fluid to keep your urine pale yellow.  Take vitamin and mineral supplements as recommended by your health care provider.  Do not drink alcohol if your health care provider tells you not to drink.  If you drink alcohol: ? Limit how much you have to 0-2 drinks a day. ? Be aware of how much alcohol is in your drink. In the [place] one drink equals one 12 oz bottle of beer (355 mL), one 5 oz glass of wine (148 mL), or one 1 oz glass of hard liquor (44 mL).   Lifestyle  Take daily care of your teeth and gums. Brush your teeth every morning and night with fluoride toothpaste. Floss one time each day.  Stay active. Exercise for at least 30 minutes 5 or more days each week.  Do not use any products that contain nicotine or tobacco, such as cigarettes, e-cigarettes, and chewing tobacco. If you need help quitting, ask your health care provider.  Do not use drugs.  If you are sexually active, practice safe sex. Use a condom or  other form of protection to prevent STIs (sexually transmitted infections).  Find healthy ways to cope with stress, such as: ? Meditation, yoga, or listening to music. ? Journaling. ? Talking to a trusted person. ? Spending time with friends and family. Safety  Always wear your seat belt while driving or riding in a vehicle.  Do not drive: ? If you have been drinking alcohol. Do not ride with someone who has been drinking. ? When you are  tired or distracted. ? While texting.  Wear a helmet and other protective equipment during sports activities.  If you have firearms in your house, make sure you follow all gun safety procedures.  Seek help if you have been physically or sexually abused. What's next?  Go to your health care provider once a year for an annual wellness visit.  Ask your health care provider how often you should have your eyes and teeth checked.  Stay up to date on all vaccines. This information is not intended to replace advice given to you by your health care provider. Make sure you discuss any questions you have with your health care provider. Document Revised: 02/11/2019 Document Reviewed: 05/22/2018 Elsevier Patient Education  2021 [employer].

## 2020-07-05 NOTE — Addendum Note (Signed)
Addended by: Elwin Mocha on: 07/05/2020 12:08 PM   Modules accepted: Orders

## 2020-07-05 NOTE — Addendum Note (Signed)
Addended by: Evert Kohl D on: 07/05/2020 12:12 PM   Modules accepted: Orders

## 2020-07-05 NOTE — Progress Notes (Signed)
Established Patient Office Visit     This visit occurred during the SARS-CoV-2 public health emergency.  Safety protocols were in place, including screening questions prior to the visit, additional usage of staff PPE, and extensive cleaning of exam room while observing appropriate contact time as indicated for disinfecting solutions.    CC/Reason for Visit: Annual preventive exam, discuss chronic conditions and some acute concerns  HPI: Edwin [name] is a 40 y.o. male who is coming in today for the above mentioned reasons. Past Medical History is significant for: Hypertension that is not well controlled, in fact he admits to significant medication noncompliance.  He also has a known history of tobacco and alcohol abuse.  He continues to smoke about a pack a day of cigarettes and drinks about a pint of alcohol every other day.  He has not yet had his Covid vaccines.  He is agreeable to flu and Tdap today.  He is complaining of pain in his upper abdominal area that is especially worse after drinking.  He does not exercise routinely.  He has routine dental care but no eye care.   Past Medical/Surgical History: Past Medical History:  Diagnosis Date  . Alcohol abuse   . Hypertension   . Tobacco abuse     No past surgical history on file.  Social History:  reports that he has been smoking. He has been smoking about 1.00 pack per day. He has never used smokeless tobacco. He reports current alcohol use. He reports that he does not use drugs.  Allergies: No Known Allergies  Family History:  Family History  Problem Relation Age of Onset  . Hypertension Mother   . Hypertension Father      Current Outpatient Medications:  .  acetaminophen (TYLENOL) 325 MG tablet, Take 2 tablets (650 mg total) by mouth every 6 (six) hours as needed., Disp: 30 tablet, Rfl: 0 .  amLODipine (NORVASC) 10 MG tablet, Take 1 tablet (10 mg total) by mouth daily., Disp: 90 tablet, Rfl: 1 .   cyclobenzaprine (FLEXERIL) 5 MG tablet, Take 1 tablet (5 mg total) by mouth at bedtime., Disp: 15 tablet, Rfl: 0 .  hydrochlorothiazide (HYDRODIURIL) 25 MG tablet, Take 1 tablet (25 mg total) by mouth daily., Disp: 90 tablet, Rfl: 1 .  ibuprofen (ADVIL,MOTRIN) 800 MG tablet, Take 1 tablet (800 mg total) by mouth 3 (three) times daily. (Patient taking differently: Take 800 mg by mouth every 6 (six) hours as needed.), Disp: 21 tablet, Rfl: 0 .  pantoprazole (PROTONIX) 40 MG tablet, Take 1 tablet (40 mg total) by mouth daily., Disp: 90 tablet, Rfl: 1  Review of Systems:  Constitutional: Denies fever, chills, diaphoresis, appetite change and fatigue.  HEENT: Denies photophobia, eye pain, redness, hearing loss, ear pain, congestion, sore throat, rhinorrhea, sneezing, mouth sores, trouble swallowing, neck pain, neck stiffness and tinnitus.   Respiratory: Denies SOB, DOE, cough, chest tightness,  and wheezing.   Cardiovascular: Denies chest pain, palpitations and leg swelling.  Gastrointestinal: Denies nausea, vomiting, diarrhea, constipation, blood in stool and abdominal distention.  Genitourinary: Denies dysuria, urgency, frequency, hematuria, flank pain and difficulty urinating.  Endocrine: Denies: hot or cold intolerance, sweats, changes in hair or nails, polyuria, polydipsia. Musculoskeletal: Denies myalgias, back pain, joint swelling, arthralgias and gait problem.  Skin: Denies pallor, rash and wound.  Neurological: Denies dizziness, seizures, syncope, weakness, light-headedness, numbness and headaches.  Hematological: Denies adenopathy. Easy bruising, personal or family bleeding history  Psychiatric/Behavioral: Denies suicidal ideation, mood changes,  confusion, nervousness, sleep disturbance and agitation    Physical Exam: Vitals:   07/05/20 1106  BP: (!) 150/100  Pulse: 80  Temp: 98.3 F (36.8 C)  TempSrc: Oral  SpO2: 98%  Weight: 215 lb 6.4 oz (97.7 kg)  Height: 6' (1.829 m)     Body mass index is 29.21 kg/m.  Constitutional: NAD, calm, comfortable Eyes: PERRL, lids and conjunctivae normal ENMT: Mucous membranes are moist. Posterior pharynx clear of any exudate or lesions. Normal dentition. Tympanic membrane is pearly white, no erythema or bulging. Neck: normal, supple, no masses, no thyromegaly Respiratory: clear to auscultation bilaterally, no wheezing, no crackles. Normal respiratory effort. No accessory muscle use.  Cardiovascular: Regular rate and rhythm, no murmurs / rubs / gallops. No extremity edema. 2+ pedal pulses.   Abdomen: no tenderness, no masses palpated. No hepatosplenomegaly. Bowel sounds positive.  Musculoskeletal: no clubbing / cyanosis. No joint deformity upper and lower extremities. Good ROM, no contractures. Normal muscle tone.  Skin: no rashes, lesions, ulcers. No induration Neurologic: CN 2-12 grossly intact. Sensation intact, DTR normal. Strength 5/5 in all 4.  Psychiatric: Normal judgment and insight. Alert and oriented x 3. Normal mood.    Impression and Plan:  Encounter for preventive health examination  -He has routine dental care, have advised routine eye care. -Flu and Tdap vaccines today, he will try and get Covid vaccines at pharmacy. -Screening labs today. -Healthy lifestyle discussed in detail, we have emphasized in his case alcohol and tobacco cessation. -Commence routine colon cancer screening age 90 and prostate cancer screening age 21.  Vitamin D deficiency  - Plan: VITAMIN D 25 Hydroxy (Vit-D Deficiency, Fractures)  Primary hypertension -Uncontrolled, suspect medication noncompliance is playing a role. -I have helped him set an alarm on his phone to remember to take medication. -He will return in 8 weeks for follow-up.  Cigarette nicotine dependence without complication -I have discussed tobacco cessation with the patient.  I have counseled the patient regarding the negative impacts of continued tobacco use  including but not limited to lung cancer, COPD, and cardiovascular disease.  I have discussed alternatives to tobacco and modalities that may help facilitate tobacco cessation including but not limited to biofeedback, hypnosis, and medications.  Total time spent with tobacco counseling was 4 minutes. -He remains in the precontemplative stage. -I will continue to address with him at subsequent visits.  Alcohol abuse -We have talked extensively about alcohol cessation.  Gastroesophageal reflux disease without esophagitis  - Plan: pantoprazole (PROTONIX) 40 MG tablet, alcohol and smoking cessation have been encouraged.  Need for influenza vaccination -Flu vaccine administered today.  Need for Tdap vaccination -Tdap administered today.   Patient Instructions   -Nice seeing you today!!  -Lab work today; will notify you once results are available.  -Flu and tetanus vaccines today.  -Remember your COVID vaccines at the pharmacy.  -Start protonix 40 mg daily for your reflux.  -Schedule follow up in 8 weeks for your blood pressure.   Preventive Care 42-8 Years Old, Male Preventive care refers to lifestyle choices and visits with your health care provider that can promote health and wellness. This includes:  A yearly physical exam. This is also called an annual wellness visit.  Regular dental and eye exams.  Immunizations.  Screening for certain conditions.  Healthy lifestyle choices, such as: ? Eating a healthy diet. ? Getting regular exercise. ? Not using drugs or products that contain nicotine and tobacco. ? Limiting alcohol use. What can I expect  for my preventive care visit? Physical exam Your health care provider may check your:  Height and weight. These may be used to calculate your BMI (body mass index). BMI is a measurement that tells if you are at a healthy weight.  Heart rate and blood pressure.  Body temperature.  Skin for abnormal spots. Counseling Your  health care provider may ask you questions about your:  Past medical problems.  Family's medical history.  Alcohol, tobacco, and drug use.  Emotional well-being.  Home life and relationship well-being.  Sexual activity.  Diet, exercise, and sleep habits.  Work and work Astronomer.  Access to firearms. What immunizations do I need? Vaccines are usually given at various ages, according to a schedule. Your health care provider will recommend vaccines for you based on your age, medical history, and lifestyle or other factors, such as travel or where you work.   What tests do I need? Blood tests  Lipid and cholesterol levels. These may be checked every 5 years starting at age 38.  Hepatitis C test.  Hepatitis B test. Screening  Diabetes screening. This is done by checking your blood sugar (glucose) after you have not eaten for a while (fasting).  Genital exam to check for testicular cancer or hernias.  STD (sexually transmitted disease) testing, if you are at risk. Talk with your health care provider about your test results, treatment options, and if necessary, the need for more tests.   Follow these instructions at home: Eating and drinking  Eat a healthy diet that includes fresh fruits and vegetables, whole grains, lean protein, and low-fat dairy products.  Drink enough fluid to keep your urine pale yellow.  Take vitamin and mineral supplements as recommended by your health care provider.  Do not drink alcohol if your health care provider tells you not to drink.  If you drink alcohol: ? Limit how much you have to 0-2 drinks a day. ? Be aware of how much alcohol is in your drink. In the [place] one drink equals one 12 oz bottle of beer (355 mL), one 5 oz glass of wine (148 mL), or one 1 oz glass of hard liquor (44 mL).   Lifestyle  Take daily care of your teeth and gums. Brush your teeth every morning and night with fluoride toothpaste. Floss one time each  day.  Stay active. Exercise for at least 30 minutes 5 or more days each week.  Do not use any products that contain nicotine or tobacco, such as cigarettes, e-cigarettes, and chewing tobacco. If you need help quitting, ask your health care provider.  Do not use drugs.  If you are sexually active, practice safe sex. Use a condom or other form of protection to prevent STIs (sexually transmitted infections).  Find healthy ways to cope with stress, such as: ? Meditation, yoga, or listening to music. ? Journaling. ? Talking to a trusted person. ? Spending time with friends and family. Safety  Always wear your seat belt while driving or riding in a vehicle.  Do not drive: ? If you have been drinking alcohol. Do not ride with someone who has been drinking. ? When you are tired or distracted. ? While texting.  Wear a helmet and other protective equipment during sports activities.  If you have firearms in your house, make sure you follow all gun safety procedures.  Seek help if you have been physically or sexually abused. What's next?  Go to your health care provider once a year for  an annual wellness visit.  Ask your health care provider how often you should have your eyes and teeth checked.  Stay up to date on all vaccines. This information is not intended to replace advice given to you by your health care provider. Make sure you discuss any questions you have with your health care provider. Document Revised: 02/11/2019 Document Reviewed: 05/22/2018 Elsevier Patient Education  2021 Elsevier Inc.      Chaya JanEstela Hernandez Acosta, MD [facility]

## 2020-07-06 ENCOUNTER — Other Ambulatory Visit: Payer: Self-pay | Admitting: Internal Medicine

## 2020-07-06 ENCOUNTER — Encounter: Payer: Self-pay | Admitting: Internal Medicine

## 2020-07-06 DIAGNOSIS — E559 Vitamin D deficiency, unspecified: Secondary | ICD-10-CM

## 2020-07-06 DIAGNOSIS — E785 Hyperlipidemia, unspecified: Secondary | ICD-10-CM | POA: Insufficient documentation

## 2020-07-06 MED ORDER — VITAMIN D (ERGOCALCIFEROL) 1.25 MG (50000 UNIT) PO CAPS: 50000.0000 [IU] | ORAL_CAPSULE | ORAL | 0 refills | Status: AC

## 2020-07-07 ENCOUNTER — Other Ambulatory Visit: Payer: Self-pay | Admitting: Internal Medicine

## 2020-07-07 DIAGNOSIS — E785 Hyperlipidemia, unspecified: Secondary | ICD-10-CM

## 2020-07-07 DIAGNOSIS — E559 Vitamin D deficiency, unspecified: Secondary | ICD-10-CM

## 2020-08-29 ENCOUNTER — Other Ambulatory Visit: Payer: Self-pay

## 2020-08-29 ENCOUNTER — Encounter (HOSPITAL_COMMUNITY): Payer: Self-pay

## 2020-08-29 ENCOUNTER — Emergency Department (HOSPITAL_COMMUNITY): Payer: 59

## 2020-08-29 DIAGNOSIS — F17200 Nicotine dependence, unspecified, uncomplicated: Secondary | ICD-10-CM | POA: Insufficient documentation

## 2020-08-29 DIAGNOSIS — I10 Essential (primary) hypertension: Secondary | ICD-10-CM | POA: Insufficient documentation

## 2020-08-29 DIAGNOSIS — Z79899 Other long term (current) drug therapy: Secondary | ICD-10-CM | POA: Diagnosis not present

## 2020-08-29 DIAGNOSIS — M25532 Pain in left wrist: Secondary | ICD-10-CM | POA: Diagnosis not present

## 2020-08-29 DIAGNOSIS — W108XXA Fall (on) (from) other stairs and steps, initial encounter: Secondary | ICD-10-CM | POA: Diagnosis not present

## 2020-08-29 NOTE — ED Triage Notes (Signed)
Pt reports falling down steps and injuring left wrist in attempt of catching himself.

## 2020-08-30 ENCOUNTER — Ambulatory Visit (INDEPENDENT_AMBULATORY_CARE_PROVIDER_SITE_OTHER): Payer: 59 | Admitting: Family Medicine

## 2020-08-30 ENCOUNTER — Encounter: Payer: Self-pay | Admitting: Family Medicine

## 2020-08-30 ENCOUNTER — Emergency Department (HOSPITAL_COMMUNITY)
Admission: EM | Admit: 2020-08-30 | Discharge: 2020-08-30 | Disposition: A | Discharge status: Home / Self Care | Payer: 59 | Attending: Emergency Medicine | Admitting: Emergency Medicine

## 2020-08-30 VITALS — BP 130/80 | HR 106 | Ht 70.0 in | Wt 212.1 lb

## 2020-08-30 DIAGNOSIS — W19XXXA Unspecified fall, initial encounter: Secondary | ICD-10-CM

## 2020-08-30 DIAGNOSIS — S60222A Contusion of left hand, initial encounter: Secondary | ICD-10-CM

## 2020-08-30 DIAGNOSIS — M25532 Pain in left wrist: Secondary | ICD-10-CM

## 2020-08-30 MED ORDER — HYDROCODONE-ACETAMINOPHEN 5-325 MG PO TABS: 1.0000 | ORAL_TABLET | Freq: Four times a day (QID) | ORAL | 0 refills | Status: DC | PRN

## 2020-08-30 NOTE — Progress Notes (Signed)
Established Patient Office Visit  Subjective:  Patient ID: Edwin [name], male    DOB: May 19, 1981  Age: 40 y.o. MRN: [id number]  CC:  Chief Complaint  Patient presents with  . Hand Pain    HPI Edwin [name] presents for left hand injury.  Last night he was going down some steps at home and missed his footing and went down.  He is not sure exactly how he landed but he had some left hand pain.  He went to the ER and had x-rays of the wrist.  His wrist did not show any fracture and no obvious hand fracture.  He had immediate swelling of the left hand.  This is very painful swollen special around the thenar eminence.  He has difficulty moving the thumb much secondary to edema.  He had up believe in the ER because of prolonged wait.  He has not been icing this or elevating consistently today.  Denies any wrist pain.  He was given a wrist splint in the ER and he feels this may have made it worse.  He denies any other injuries.  No left elbow or shoulder pain.  Past Medical History:  Diagnosis Date  . Alcohol abuse   . Hypertension   . Tobacco abuse     History reviewed. No pertinent surgical history.  Family History  Problem Relation Age of Onset  . Hypertension Mother   . Hypertension Father     Social History   Socioeconomic History  . Marital status: Single    Spouse name: Not on file  . Number of children: Not on file  . Years of education: Not on file  . Highest education level: Not on file  Occupational History  . Not on file  Tobacco Use  . Smoking status: Current Every Day Smoker    Packs/day: 1.00  . Smokeless tobacco: Never Used  Substance and Sexual Activity  . Alcohol use: Yes    Comment: 2 40oz every day  . Drug use: No  . Sexual activity: Not on file  Other Topics Concern  . Not on file  Social History Narrative  . Not on file   Social Determinants of Health   Financial Resource Strain: Not on file  Food Insecurity: Not on file   Transportation Needs: Not on file  Physical Activity: Not on file  Stress: Not on file  Social Connections: Not on file  Intimate Partner Violence: Not on file    Outpatient Medications Prior to Visit  Medication Sig Dispense Refill  . acetaminophen (TYLENOL) 325 MG tablet Take 2 tablets (650 mg total) by mouth every 6 (six) hours as needed. 30 tablet 0  . amLODipine (NORVASC) 10 MG tablet Take 1 tablet (10 mg total) by mouth daily. 90 tablet 1  . cyclobenzaprine (FLEXERIL) 5 MG tablet Take 1 tablet (5 mg total) by mouth at bedtime. 15 tablet 0  . hydrochlorothiazide (HYDRODIURIL) 25 MG tablet Take 1 tablet (25 mg total) by mouth daily. 90 tablet 1  . ibuprofen (ADVIL,MOTRIN) 800 MG tablet Take 1 tablet (800 mg total) by mouth 3 (three) times daily. (Patient taking differently: Take 800 mg by mouth every 6 (six) hours as needed.) 21 tablet 0  . pantoprazole (PROTONIX) 40 MG tablet Take 1 tablet (40 mg total) by mouth daily. 90 tablet 1  . Vitamin D, Ergocalciferol, (DRISDOL) 1.25 MG (50000 UNIT) CAPS capsule Take 1 capsule (50,000 Units total) by mouth every 7 (seven) days for 12 doses. 12  capsule 0   No facility-administered medications prior to visit.    No Known Allergies  ROS Review of Systems  Neurological: Negative for weakness.      Objective:    Physical Exam Constitutional:      Appearance: Normal appearance.  Cardiovascular:     Rate and Rhythm: Normal rate and regular rhythm.  Pulmonary:     Effort: Pulmonary effort is normal.     Breath sounds: Normal breath sounds.  Musculoskeletal:     Comments: Obvious swelling of the left hand mostly on the thenar eminence on the volar surface but this extends dorsally.  He has some limited range of motion of the left thumb secondary to swelling.  He has good capillary refill in all digits.  He has some diffuse ecchymosis of the hand.  No localized bony tenderness in the wrist or hand.  Neurological:     Mental Status: He is  alert.     BP 130/80   Pulse (!) 106   Ht 5\' 10"  (1.778 m)   Wt 212 lb 1.3 oz (96.2 kg)   SpO2 99%   BMI 30.43 kg/m  Wt Readings from Last 3 Encounters:  08/30/20 212 lb 1.3 oz (96.2 kg)  08/29/20 212 lb (96.2 kg)  07/05/20 215 lb 6.4 oz (97.7 kg)     Health Maintenance Due  Topic Date Due  . Hepatitis C Screening  Never done  . COVID-19 Vaccine (1) Never done  . HIV Screening  Never done    There are no preventive care reminders to display for this patient.  Lab Results  Component Value Date   TSH 1.38 07/05/2020   Lab Results  Component Value Date   WBC 8.0 07/05/2020   HGB 13.3 07/05/2020   HCT 39.3 07/05/2020   MCV 92.9 07/05/2020   PLT 228.0 07/05/2020   Lab Results  Component Value Date   NA 139 07/05/2020   K 4.3 07/05/2020   CO2 28 07/05/2020   GLUCOSE 86 07/05/2020   BUN 11 07/05/2020   CREATININE 0.69 07/05/2020   BILITOT 0.4 07/05/2020   ALKPHOS 34 (L) 07/05/2020   AST 17 07/05/2020   ALT 15 07/05/2020   PROT 7.1 07/05/2020   ALBUMIN 4.7 07/05/2020   CALCIUM 9.8 07/05/2020   ANIONGAP 10 06/03/2017   GFR 117.91 07/05/2020   Lab Results  Component Value Date   CHOL 219 (H) 07/05/2020   Lab Results  Component Value Date   HDL 64.40 07/05/2020   Lab Results  Component Value Date   LDLCALC 141 (H) 07/05/2020   Lab Results  Component Value Date   TRIG 68.0 07/05/2020   Lab Results  Component Value Date   CHOLHDL 3 07/05/2020   Lab Results  Component Value Date   HGBA1C 4.8 07/05/2020      Assessment & Plan:   Left hand injury following fall.  Suspect he has hematoma and significant bruising.  X-rays did not show any fracture.  He denies any wrist pain.  -Recommend frequent elevation and also frequent icing 15 to 20 minutes every couple hours -We applied light Coban wrap for some mild compression -Out of work for the rest this week because his job requires frequent lifting -He has scheduled follow-up already with his  primary this Friday and recommend he keep that for reassessment -Follow-up immediately for any worsening swelling or any signs of compartment syndrome which were reviewed. -There is complained of severe pain and we gave him prescription  for limited hydrocodone 5 mg 1-2 every 6 hours as needed for severe pain  Meds ordered this encounter  Medications  . HYDROcodone-acetaminophen (NORCO/VICODIN) 5-325 MG tablet    Sig: Take 1-2 tablets by mouth every 6 (six) hours as needed for moderate pain.    Dispense:  30 tablet    Refill:  0    Follow-up: No follow-ups on file.    Evelena Peat, MD

## 2020-08-30 NOTE — ED Notes (Signed)
Pt eloped from waiting area prior to discharge papers. Ambulatory. No distress.

## 2020-08-30 NOTE — ED Provider Notes (Signed)
Edwin [name] Arrival date & time: 08/29/20  2221     History Chief Complaint  Patient presents with  . Wrist Pain    Edwin [name] is a 40 y.o. male.  The history is provided by the patient and medical records.  Wrist Pain   40 y.o. M with hx of alcohol abuse, HTN, tobacco abuse disorder, presenting to the ED for left wrist pain.  States he tripped over a child's bookbag on the steps and tried to catch himself with left hand.  No head injury or LOC.    Past Medical History:  Diagnosis Date  . Alcohol abuse   . Hypertension   . Tobacco abuse     Patient Active Problem List   Diagnosis Date Noted  . Hyperlipidemia 07/06/2020  . Vitamin D deficiency 10/13/2019  . Hypertension   . Tobacco abuse   . Alcohol abuse     History reviewed. No pertinent surgical history.     Family History  Problem Relation Age of Onset  . Hypertension Mother   . Hypertension Father     Social History   Tobacco Use  . Smoking status: Current Every Day Smoker    Packs/day: 1.00  . Smokeless tobacco: Never Used  Substance Use Topics  . Alcohol use: Yes    Comment: 2 40oz every day  . Drug use: No    Home Medications Prior to Admission medications   Medication Sig Start Date End Date Taking? Authorizing Provider  acetaminophen (TYLENOL) 325 MG tablet Take 2 tablets (650 mg total) by mouth every 6 (six) hours as needed. 06/16/15   Sam, Ace Gins, PA-C  amLODipine (NORVASC) 10 MG tablet Take 1 tablet (10 mg total) by mouth daily. 10/09/19   Philip Aspen, Limmie Patricia, MD  cyclobenzaprine (FLEXERIL) 5 MG tablet Take 1 tablet (5 mg total) by mouth at bedtime. 04/06/20   Deeann Saint, MD  hydrochlorothiazide (HYDRODIURIL) 25 MG tablet Take 1 tablet (25 mg total) by mouth daily. 11/24/19   Philip Aspen, Limmie Patricia, MD  ibuprofen (ADVIL,MOTRIN) 800 MG tablet Take 1 tablet (800 mg total) by mouth 3 (three) times  daily. Patient taking differently: Take 800 mg by mouth every 6 (six) hours as needed. 02/07/14   Hess, Nada Boozer, PA-C  pantoprazole (PROTONIX) 40 MG tablet Take 1 tablet (40 mg total) by mouth daily. 07/05/20   Philip Aspen, Limmie Patricia, MD  Vitamin D, Ergocalciferol, (DRISDOL) 1.25 MG (50000 UNIT) CAPS capsule Take 1 capsule (50,000 Units total) by mouth every 7 (seven) days for 12 doses. 07/06/20 09/22/20  Henderson Cloud, MD    Allergies    Patient has no known allergies.  Review of Systems   Review of Systems  Musculoskeletal: Positive for arthralgias.  All other systems reviewed and are negative.   Physical Exam Updated Vital Signs BP (!) 160/113 (BP Location: Right Arm)   Pulse 95   Temp 98 F (36.7 C) (Oral)   Resp 18   Ht 5\' 10"  (1.778 m)   Wt 96.2 kg   SpO2 99%   BMI 30.42 kg/m   Physical Exam Vitals and nursing note reviewed.  Constitutional:      Appearance: He is well-developed.  HENT:     Head: Normocephalic and atraumatic.  Eyes:     Conjunctiva/sclera: Conjunctivae normal.     Pupils: Pupils are equal, round, and reactive to light.  Cardiovascular:  Rate and Rhythm: Normal rate and regular rhythm.     Heart sounds: Normal heart sounds.  Pulmonary:     Effort: Pulmonary effort is normal.     Breath sounds: Normal breath sounds.  Abdominal:     General: Bowel sounds are normal.     Palpations: Abdomen is soft.  Musculoskeletal:        General: Normal range of motion.     Cervical back: Normal range of motion.     Comments: Left wrist overall normal in appearance without noted swelling or bony deformity; pain elicited with attempted ROM, some mild tenderness noted at base of left thumb, no tenderness in anatomic snuffbox, normal radial pulse and cap refill, hand warm and well perfused  Skin:    General: Skin is warm and dry.  Neurological:     Mental Status: He is alert and oriented to person, place, and time.     ED Results / Procedures  / Treatments   Labs (all labs ordered are listed, but only abnormal results are displayed) Labs Reviewed - No data to display  EKG None  Radiology DG Wrist Complete Left  Result Date: 08/29/2020 CLINICAL DATA:  Pain fall EXAM: LEFT WRIST - COMPLETE 3+ VIEW COMPARISON:  None. FINDINGS: There is no evidence of fracture or dislocation. There is no evidence of arthropathy or other focal bone abnormality. Soft tissues are unremarkable. IMPRESSION: Negative. Electronically Signed   By: Jonna Clark M.D.   On: 08/29/2020 23:11    Procedures Procedures   Medications Ordered in ED Medications - No data to display  ED Course  I have reviewed the triage vital signs and the nursing notes.  Pertinent labs & imaging results that were available during my care of the patient were reviewed by me and considered in my medical decision making (see chart for details).    MDM Rules/Calculators/A&P  40 y.o. M here with left wrist pain after FOOSH type injury where he tripped over a backpack on some stairs.  There is no head injury or loss of consciousness.  Persistent pain in left wrist and base of left thumb since this occurred.  There is no significant swelling or gross deformity on exam.  His hand is neurovascularly intact.  No tenderness in the snuffbox.  X-rays negative.  He is placed in Velcro splint, symptomatic care encouraged.  He was given hand surgery follow-up if not improving.  Return here for any new or acute changes.  Final Clinical Impression(s) / ED Diagnoses Final diagnoses:  Fall, initial encounter  Left wrist pain    Rx / DC Orders ED Discharge Orders    None       Garlon Hatchet, PA-C 08/30/20 0341    Nira Conn, MD 08/31/20 [phone]

## 2020-08-30 NOTE — Patient Instructions (Signed)
Elevate hand frequently  Ice 15-20 minutes every 1-2 hours  Follow up this Friday for re-check.

## 2020-08-30 NOTE — Discharge Instructions (Signed)
Your x-ray today was normal. Recommend to continue wearing brace for now.  Can take Tylenol or Motrin for pain. If symptoms do not seem to be improving, can follow-up with hand specialist, Dr. Roney Mans. Otherwise, he may follow-up with your primary care doctor. Return here for any new or acute changes.

## 2020-09-01 ENCOUNTER — Other Ambulatory Visit: Payer: Self-pay

## 2020-09-02 ENCOUNTER — Ambulatory Visit (INDEPENDENT_AMBULATORY_CARE_PROVIDER_SITE_OTHER): Payer: 59 | Admitting: Internal Medicine

## 2020-09-02 ENCOUNTER — Encounter: Payer: Self-pay | Admitting: Internal Medicine

## 2020-09-02 ENCOUNTER — Ambulatory Visit: Payer: 59 | Admitting: Internal Medicine

## 2020-09-02 VITALS — BP 150/100 | HR 112 | Temp 99.0°F | Wt 211.5 lb

## 2020-09-02 DIAGNOSIS — I10 Essential (primary) hypertension: Secondary | ICD-10-CM | POA: Diagnosis not present

## 2020-09-02 NOTE — Progress Notes (Signed)
Established Patient Office Visit     This visit occurred during the SARS-CoV-2 public health emergency.  Safety protocols were in place, including screening questions prior to the visit, additional usage of staff PPE, and extensive cleaning of exam room while observing appropriate contact time as indicated for disinfecting solutions.    CC/Reason for Visit: Blood pressure follow-up  HPI: Edwin [name] is a 40 y.o. male who is coming in today for the above mentioned reasons. Past Medical History is significant for: Hypertension that has not been well controlled presumably due to medication noncompliance.  He tells me that he has only been taking his blood pressure medication maybe 2 out of 7 days a week.  He has an ongoing issue with tobacco and alcohol abuse.  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  He visited urgent care had x-rays that were negative.  Continues to have some mild swelling of the area, pain is improving.  He would like [place] to rewrap his hand today   Past Medical/Surgical History: Past Medical History:  Diagnosis Date  . Alcohol abuse   . Hypertension   . Tobacco abuse     No past surgical history on file.  Social History:  reports that he has been smoking. He has been smoking about 1.00 pack per day. He has never used smokeless tobacco. He reports current alcohol use. He reports that he does not use drugs.  Allergies: No Known Allergies  Family History:  Family History  Problem Relation Age of Onset  . Hypertension Mother   . Hypertension Father      Current Outpatient Medications:  .  acetaminophen (TYLENOL) 325 MG tablet, Take 2 tablets (650 mg total) by mouth every 6 (six) hours as needed., Disp: 30 tablet, Rfl: 0 .  amLODipine (NORVASC) 10 MG tablet, Take 1 tablet (10 mg total) by mouth daily., Disp: 90 tablet, Rfl: 1 .  cyclobenzaprine (FLEXERIL) 5 MG tablet, Take 1 tablet  (5 mg total) by mouth at bedtime., Disp: 15 tablet, Rfl: 0 .  hydrochlorothiazide (HYDRODIURIL) 25 MG tablet, Take 1 tablet (25 mg total) by mouth daily., Disp: 90 tablet, Rfl: 1 .  HYDROcodone-acetaminophen (NORCO/VICODIN) 5-325 MG tablet, Take 1-2 tablets by mouth every 6 (six) hours as needed for moderate pain., Disp: 30 tablet, Rfl: 0 .  ibuprofen (ADVIL,MOTRIN) 800 MG tablet, Take 1 tablet (800 mg total) by mouth 3 (three) times daily. (Patient taking differently: Take 800 mg by mouth every 6 (six) hours as needed.), Disp: 21 tablet, Rfl: 0 .  pantoprazole (PROTONIX) 40 MG tablet, Take 1 tablet (40 mg total) by mouth daily., Disp: 90 tablet, Rfl: 1 .  Vitamin D, Ergocalciferol, (DRISDOL) 1.25 MG (50000 UNIT) CAPS capsule, Take 1 capsule (50,000 Units total) by mouth every 7 (seven) days for 12 doses., Disp: 12 capsule, Rfl: 0  Review of Systems:  Constitutional: Denies fever, chills, diaphoresis, appetite change and fatigue.  HEENT: Denies photophobia, eye pain, redness, hearing loss, ear pain, congestion, sore throat, rhinorrhea, sneezing, mouth sores, trouble swallowing, neck pain, neck stiffness and tinnitus.   Respiratory: Denies SOB, DOE, cough, chest tightness,  and wheezing.   Cardiovascular: Denies chest pain, palpitations and leg swelling.  Gastrointestinal: Denies nausea, vomiting, abdominal pain, diarrhea, constipation, blood in stool and abdominal distention.  Genitourinary: Denies dysuria, urgency, frequency, hematuria, flank pain and difficulty urinating.  Endocrine: Denies: hot or cold  intolerance, sweats, changes in hair or nails, polyuria, polydipsia. Musculoskeletal: Denies myalgias, back pain, joint swelling, arthralgias and gait problem.  Skin: Denies pallor, rash and wound.  Neurological: Denies dizziness, seizures, syncope, weakness, light-headedness, numbness and headaches.  Hematological: Denies adenopathy. Easy bruising, personal or family bleeding history   Psychiatric/Behavioral: Denies suicidal ideation, mood changes, confusion, nervousness, sleep disturbance and agitation    Physical Exam: Vitals:   09/02/20 1126  BP: (!) 150/100  Pulse: (!) 112  Temp: 99 F (37.2 C)  TempSrc: Oral  SpO2: 99%  Weight: 211 lb 8 oz (95.9 kg)    Body mass index is 30.35 kg/m.   Constitutional: NAD, calm, comfortable Eyes: PERRL, lids and conjunctivae normal ENMT: Mucous membranes are moist.  Respiratory: clear to auscultation bilaterally, no wheezing, no crackles. Normal respiratory effort. No accessory muscle use.  Cardiovascular: Regular rate and rhythm, no murmurs / rubs / gallops. No extremity edema.  Neurologic: Grossly intact and nonfocal Psychiatric: Normal judgment and insight. Alert and oriented x 3. Normal mood.    Impression and Plan:  Primary hypertension -He has again been advised of importance of medication compliance. -We have placed alarms on his phone, we have tried to troubleshoot.  He tells me that his alarm goes off in the morning while he is busy trying to get out of the house and then he forgets to take them.  We have discussed may be keeping his medications in the car instead, he will try to do this. -Return in 6 to 8 weeks for follow-up.  His hand has been wrapped in [place] to try and reduce some of the swelling.   Patient Instructions  -Nice seeing you today!!  -Please make sure you take your blood pressure medication every day: amlodipine 10 mg daily and HCTZ 25 ng daily.  -Schedule follow up in 6-8 weeks for your blood pressure.     Chaya Jan, MD [facility]

## 2020-09-02 NOTE — Patient Instructions (Signed)
-  Nice seeing you today!!  -Please make sure you take your blood pressure medication every day: amlodipine 10 mg daily and HCTZ 25 ng daily.  -Schedule follow up in 6-8 weeks for your blood pressure.

## 2020-09-29 ENCOUNTER — Other Ambulatory Visit: Payer: Self-pay | Admitting: Internal Medicine

## 2020-09-29 DIAGNOSIS — E559 Vitamin D deficiency, unspecified: Secondary | ICD-10-CM

## 2020-10-08 ENCOUNTER — Encounter: Payer: Self-pay | Admitting: Emergency Medicine

## 2020-10-08 ENCOUNTER — Ambulatory Visit
Admission: EM | Admit: 2020-10-08 | Discharge: 2020-10-08 | Disposition: A | Discharge status: Home / Self Care | Payer: 59 | Attending: Emergency Medicine | Admitting: Emergency Medicine

## 2020-10-08 ENCOUNTER — Other Ambulatory Visit: Payer: Self-pay

## 2020-10-08 DIAGNOSIS — S46812A Strain of other muscles, fascia and tendons at shoulder and upper arm level, left arm, initial encounter: Secondary | ICD-10-CM | POA: Diagnosis not present

## 2020-10-08 DIAGNOSIS — M5412 Radiculopathy, cervical region: Secondary | ICD-10-CM | POA: Diagnosis not present

## 2020-10-08 MED ORDER — PREDNISONE 10 MG PO TABS: ORAL_TABLET | ORAL | 0 refills | Status: DC

## 2020-10-08 MED ORDER — TIZANIDINE HCL 2 MG PO TABS: 2.0000 mg | ORAL_TABLET | Freq: Four times a day (QID) | ORAL | 0 refills | Status: DC | PRN

## 2020-10-08 NOTE — Discharge Instructions (Signed)
Begin prednisone taper x6 days-begin with 6 tablets on day 1, decrease by 1 tablet each day until complete-6, 5, 4, 3, 2, 1-take with food and earlier in the day if possible   Use tizanidine at home/bedtime-this muscle relaxer, may cause drowsiness, do not drive or work after taking  Gentle stretching, alternate ice and heat  Follow-up if not improving or worsening

## 2020-10-08 NOTE — ED Provider Notes (Signed)
[facility]    CSN: [id number] Arrival date & time: 10/08/20  1235      History   Chief Complaint Chief Complaint  Patient presents with  . Neck Pain  . Arm Pain    HPI Nole Robey is a 40 y.o. male history of hypertension, tobacco use, alcohol use presenting today for evaluation of left arm pain.  Reports neck pain and stiffness with associated tingling sensations into left arm.  Denies injury or trauma.  Woke up with this this morning.  Reports slight paresthesias into left leg, denies any pain in leg.  Denies urinary symptoms.  Denies saddle anesthesia.  Denies taking any medicines to help with symptoms.  Denies history of similar.  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  Denies any associated vision changes, dizziness or lightheadedness.  Denies chest pain or shortness of breath.  Denies history of diabetes.  HPI  Past Medical History:  Diagnosis Date  . Alcohol abuse   . Hypertension   . Tobacco abuse     Patient Active Problem List   Diagnosis Date Noted  . Hyperlipidemia 07/06/2020  . Vitamin D deficiency 10/13/2019  . Hypertension   . Tobacco abuse   . Alcohol abuse     History reviewed. No pertinent surgical history.     Home Medications    Prior to Admission medications   Medication Sig Start Date End Date Taking? Authorizing Provider  predniSONE (DELTASONE) 10 MG tablet Begin with 6 tabs on day 1, 5 tab on day 2, 4 tab on day 3, 3 tab on day 4, 2 tab on day 5, 1 tab on day 6-take with food 10/08/20  Yes Kyrstal Monterrosa C, PA-C  tiZANidine (ZANAFLEX) 2 MG tablet Take 1-2 tablets (2-4 mg total) by mouth every 6 (six) hours as needed for muscle spasms. 10/08/20  Yes Shelsey Rieth C, PA-C  acetaminophen (TYLENOL) 325 MG tablet Take 2 tablets (650 mg total) by mouth every 6 (six) hours as needed. 06/16/15   Sam, Ace Gins, PA-C  amLODipine (NORVASC) 10 MG tablet Take 1 tablet (10 mg total) by mouth daily. 10/09/19   Philip Aspen, Limmie Patricia, MD  cyclobenzaprine (FLEXERIL) 5 MG tablet Take 1 tablet (5 mg total) by mouth at bedtime. Patient not taking: Reported on 10/08/2020 04/06/20   Deeann Saint, MD  hydrochlorothiazide (HYDRODIURIL) 25 MG tablet Take 1 tablet (25 mg total) by mouth daily. 11/24/19   Philip Aspen, Limmie Patricia, MD  HYDROcodone-acetaminophen (NORCO/VICODIN) 5-325 MG tablet Take 1-2 tablets by mouth every 6 (six) hours as needed for moderate pain. Patient not taking: Reported on 10/08/2020 08/30/20   Kristian Covey, MD  ibuprofen (ADVIL,MOTRIN) 800 MG tablet Take 1 tablet (800 mg total) by mouth 3 (three) times daily. Patient taking differently: Take 800 mg by mouth every 6 (six) hours as needed. 02/07/14   Hess, Nada Boozer, PA-C  pantoprazole (PROTONIX) 40 MG tablet Take 1 tablet (40 mg total) by mouth daily. 07/05/20   Philip Aspen, Limmie Patricia, MD    Family History Family History  Problem Relation Age of Onset  . Hypertension Mother   . Hypertension Father     Social History Social History   Tobacco Use  . Smoking status: Current Every Day Smoker    Packs/day: 1.00  . Smokeless tobacco: Never Used  Substance Use Topics  . Alcohol use: Yes    Comment: 2 40oz every day  . Drug use: No  Allergies   Patient has no known allergies.   Review of Systems Review of Systems  Constitutional: Negative for fatigue and fever.  Eyes: Negative for redness, itching and visual disturbance.  Respiratory: Negative for shortness of breath.   Cardiovascular: Negative for chest pain and leg swelling.  Gastrointestinal: Negative for nausea and vomiting.  Musculoskeletal: Positive for back pain and myalgias. Negative for arthralgias and joint swelling.  Skin: Negative for color change, rash and wound.  Neurological: Negative for dizziness, syncope, weakness, light-headedness and headaches.     Physical Exam Triage Vital Signs ED Triage Vitals  Enc Vitals Group     BP      Pulse       Resp      Temp      Temp src      SpO2      Weight      Height      Head Circumference      Peak Flow      Pain Score      Pain Loc      Pain Edu?      Excl. in GC?    No data found.  Updated Vital Signs BP (!) 168/116 (BP Location: Left Arm)   Pulse 91   Temp 98.4 F (36.9 C) (Oral)   Resp 18   SpO2 96%   Visual Acuity Right Eye Distance:   Left Eye Distance:   Bilateral Distance:    Right Eye Near:   Left Eye Near:    Bilateral Near:     Physical Exam Vitals and nursing note reviewed.  Constitutional:      Appearance: He is well-developed.     Comments: No acute distress  HENT:     Head: Normocephalic and atraumatic.     Nose: Nose normal.  Eyes:     Conjunctiva/sclera: Conjunctivae normal.  Cardiovascular:     Rate and Rhythm: Normal rate and regular rhythm.  Pulmonary:     Effort: Pulmonary effort is normal. No respiratory distress.     Comments: Breathing comfortably at rest, CTABL, no wheezing, rales or other adventitious sounds auscultated Abdominal:     General: There is no distension.  Musculoskeletal:        General: Normal range of motion.     Cervical back: Neck supple.     Comments: Nontender to palpation along cervical, thoracic spine midline, diffuse tenderness throughout left cervical, superior thoracic/trapezius musculature and periscapular musculature, no anterior chest tenderness, no bony tenderness along clavicle, AC joint or scapular spine, full active range of motion of left shoulder, strength 5/5 ankle bilaterally, grip strength 5/5 ankle bilaterally, radial pulse 2+  Strength at hips and knees 5/5 ankle bilaterally, ambulating without abnormality  Skin:    General: Skin is warm and dry.  Neurological:     Mental Status: He is alert and oriented to person, place, and time.      UC Treatments / Results  Labs (all labs ordered are listed, but only abnormal results are displayed) Labs Reviewed - No data to  display  EKG   Radiology No results found.  Procedures Procedures (including critical care time)  Medications Ordered in UC Medications - No data to display  Initial Impression / Assessment and Plan / UC Course  I have reviewed the triage vital signs and the nursing notes.  Pertinent labs & imaging results that were available during my care of the patient were reviewed by me and considered in my  medical decision making (see chart for details).     Suspect likely cervical strain with radiculopathy/trapezius/upper thoracic strain-treating with prednisone taper given associated paresthesias, no weakness or other neurodeficit, supplement with muscle relaxers.  Gentle stretching, ice and heat.  Monitor for gradual resolution.  Discussed strict return precautions. Patient verbalized understanding and is agreeable with plan.  Final Clinical Impressions(s) / UC Diagnoses   Final diagnoses:  Strain of left trapezius muscle, initial encounter  Cervical radiculopathy     Discharge Instructions     Begin prednisone taper x6 days-begin with 6 tablets on day 1, decrease by 1 tablet each day until complete-6, 5, 4, 3, 2, 1-take with food and earlier in the day if possible   Use tizanidine at home/bedtime-this muscle relaxer, may cause drowsiness, do not drive or work after taking  Gentle stretching, alternate ice and heat  Follow-up if not improving or worsening    ED Prescriptions    Medication Sig Dispense Auth. Provider   predniSONE (DELTASONE) 10 MG tablet Begin with 6 tabs on day 1, 5 tab on day 2, 4 tab on day 3, 3 tab on day 4, 2 tab on day 5, 1 tab on day 6-take with food 21 tablet Una Yeomans C, PA-C   tiZANidine (ZANAFLEX) 2 MG tablet Take 1-2 tablets (2-4 mg total) by mouth every 6 (six) hours as needed for muscle spasms. 30 tablet Wilgus Deyton, [place] C, PA-C     PDMP not reviewed this encounter.   Lew Dawes, [place] 10/08/20 1341

## 2020-10-08 NOTE — ED Triage Notes (Signed)
Pt sts left sided neck pain with pain with movement upon waking this morning that radiates down left arm; pt sts some pain in upper left leg as well; denies obvious injury; no weakness noted

## 2020-11-21 ENCOUNTER — Other Ambulatory Visit: Payer: Self-pay

## 2020-11-21 ENCOUNTER — Ambulatory Visit (INDEPENDENT_AMBULATORY_CARE_PROVIDER_SITE_OTHER): Payer: 59 | Admitting: Family Medicine

## 2020-11-21 ENCOUNTER — Encounter: Payer: Self-pay | Admitting: Family Medicine

## 2020-11-21 VITALS — BP 148/102 | HR 114 | Temp 99.2°F | Wt 203.4 lb

## 2020-11-21 DIAGNOSIS — Z91199 Patient's noncompliance with other medical treatment and regimen due to unspecified reason: Secondary | ICD-10-CM

## 2020-11-21 DIAGNOSIS — Z7289 Other problems related to lifestyle: Secondary | ICD-10-CM

## 2020-11-21 DIAGNOSIS — N4889 Other specified disorders of penis: Secondary | ICD-10-CM

## 2020-11-21 DIAGNOSIS — L509 Urticaria, unspecified: Secondary | ICD-10-CM | POA: Diagnosis not present

## 2020-11-21 DIAGNOSIS — F17210 Nicotine dependence, cigarettes, uncomplicated: Secondary | ICD-10-CM | POA: Diagnosis not present

## 2020-11-21 DIAGNOSIS — F1090 Alcohol use, unspecified, uncomplicated: Secondary | ICD-10-CM

## 2020-11-21 DIAGNOSIS — Z789 Other specified health status: Secondary | ICD-10-CM

## 2020-11-21 DIAGNOSIS — Z1159 Encounter for screening for other viral diseases: Secondary | ICD-10-CM

## 2020-11-21 DIAGNOSIS — Z9119 Patient's noncompliance with other medical treatment and regimen: Secondary | ICD-10-CM | POA: Diagnosis not present

## 2020-11-21 DIAGNOSIS — I10 Essential (primary) hypertension: Secondary | ICD-10-CM | POA: Diagnosis not present

## 2020-11-21 LAB — POCT URINALYSIS DIPSTICK
Blood, UA: NEGATIVE
Glucose, UA: NEGATIVE
Ketones, UA: NEGATIVE
Leukocytes, UA: NEGATIVE
Nitrite, UA: NEGATIVE
Protein, UA: POSITIVE — AB
Spec Grav, UA: >=1.03 — AB (ref 1.010–1.025)
Urobilinogen, UA: 0.2 E.U./dL
pH, UA: 6 (ref 5.0–8.0)

## 2020-11-21 LAB — COMPREHENSIVE METABOLIC PANEL
ALT: 54 U/L — ABNORMAL HIGH (ref 0–53)
AST: 24 U/L (ref 0–37)
Albumin: 4.4 g/dL (ref 3.5–5.2)
Alkaline Phosphatase: 33 U/L — ABNORMAL LOW (ref 39–117)
BUN: 11 mg/dL (ref 6–23)
CO2: 30 mEq/L (ref 19–32)
Calcium: 9.5 mg/dL (ref 8.4–10.5)
Chloride: 100 mEq/L (ref 96–112)
Creatinine, Ser: 0.7 mg/dL (ref 0.40–1.50)
GFR: 117.09 mL/min (ref >60.00)
Glucose, Bld: 82 mg/dL (ref 70–99)
Potassium: 3.8 mEq/L (ref 3.5–5.1)
Sodium: 141 mEq/L (ref 135–145)
Total Bilirubin: 0.8 mg/dL (ref 0.2–1.2)
Total Protein: 6.7 g/dL (ref 6.0–8.3)

## 2020-11-21 LAB — VITAMIN B12: Vitamin B-12: 330 pg/mL (ref 211–911)

## 2020-11-21 LAB — CBC WITH DIFFERENTIAL/PLATELET
Basophils Absolute: 0 10*3/uL (ref 0.0–0.1)
Basophils Relative: 0.3 % (ref 0.0–3.0)
Eosinophils Absolute: 0.1 10*3/uL (ref 0.0–0.7)
Eosinophils Relative: 0.6 % (ref 0.0–5.0)
HCT: 41.8 % (ref 39.0–52.0)
Hemoglobin: 14.1 g/dL (ref 13.0–17.0)
Lymphocytes Relative: 13.6 % (ref 12.0–46.0)
Lymphs Abs: 1.4 10*3/uL (ref 0.7–4.0)
MCHC: 33.7 g/dL (ref 30.0–36.0)
MCV: 95 fl (ref 78.0–100.0)
Monocytes Absolute: 0.9 10*3/uL (ref 0.1–1.0)
Monocytes Relative: 8.9 % (ref 3.0–12.0)
Neutro Abs: 7.7 10*3/uL (ref 1.4–7.7)
Neutrophils Relative %: 76.6 % (ref 43.0–77.0)
Platelets: 207 10*3/uL (ref 150.0–400.0)
RBC: 4.4 Mil/uL (ref 4.22–5.81)
RDW: 13.5 % (ref 11.5–15.5)
WBC: 10 10*3/uL (ref 4.0–10.5)

## 2020-11-21 LAB — FOLATE: Folate: 5.6 ng/mL — ABNORMAL LOW (ref >5.9)

## 2020-11-21 NOTE — Progress Notes (Signed)
Subjective:    Patient ID: Edwin [name], male    DOB: 08/18/1980, 39 y.o.   MRN: [id number]  Chief Complaint  Patient presents with   Urticaria    Stated he had a beer last night, when he was almost finished he started itching all over -chest, arms, and legs. Took a benadryl before going to bed, woke up this morning and they are almost gone.    HPI Patient was seen today for acute concern.  Patient endorses hives last night after finishing a beer while sitting outside.  Pt also had a shot of henesesy, and a 40 oz beer.  Pt notes changing to irish spings soap as they did not have his regular soap at the store.  Pt took benadryl which helped symptoms.  Pt also mentions penile edema during the episode.  Denies current edema, pain, or rash.  Smoking [place], 1 ppd, no recent changes in cigarettes.  Pt has not taken his bp medications today.  States takes Norvasc and HCTZ before work when he remembers.     Past Medical History:  Diagnosis Date   Alcohol abuse    Hypertension    Tobacco abuse     No Known Allergies  ROS General: Denies fever, chills, night sweats, changes in weight, changes in appetite HEENT: Denies headaches, ear pain, changes in vision, rhinorrhea, sore throat CV: Denies CP, palpitations, SOB, orthopnea Pulm: Denies SOB, cough, wheezing GI: Denies abdominal pain, nausea, vomiting, diarrhea, constipation GU: Denies dysuria, hematuria, frequency +resolved penile edema Msk: Denies muscle cramps, joint pains Neuro: Denies weakness, numbness, tingling Skin: Denies rashes, bruising  +resoled hives Psych: Denies depression, anxiety, hallucinations     Objective:    Blood pressure (!) 148/102, pulse (!) 114, temperature 99.2 F (37.3 C), temperature source Oral, weight 203 lb 6.4 oz (92.3 kg), SpO2 99 %.  Gen. Pleasant, well-nourished, in no distress, normal affect   HEENT: [place]/AT, face symmetric, conjunctiva clear, no scleral icterus, PERRLA, EOMI, nares patent  without drainage Lungs: no accessory muscle use Cardiovascular: Tachycardia, no peripheral edema Musculoskeletal: No deformities, no cyanosis or clubbing, normal tone Neuro:  A&Ox3, CN II-XII intact, normal gait Skin:  Warm, no lesions/ rash   Wt Readings from Last 3 Encounters:  11/21/20 203 lb 6.4 oz (92.3 kg)  09/02/20 211 lb 8 oz (95.9 kg)  08/30/20 212 lb 1.3 oz (96.2 kg)    Lab Results  Component Value Date   WBC 8.0 07/05/2020   HGB 13.3 07/05/2020   HCT 39.3 07/05/2020   PLT 228.0 07/05/2020   GLUCOSE 86 07/05/2020   CHOL 219 (H) 07/05/2020   TRIG 68.0 07/05/2020   HDL 64.40 07/05/2020   LDLCALC 141 (H) 07/05/2020   ALT 15 07/05/2020   AST 17 07/05/2020   NA 139 07/05/2020   K 4.3 07/05/2020   CL 103 07/05/2020   CREATININE 0.69 07/05/2020   BUN 11 07/05/2020   CO2 28 07/05/2020   TSH 1.38 07/05/2020   HGBA1C 4.8 07/05/2020    Assessment/Plan:  Urticaria -Resolved -Discussed possible causes including allergic reaction to food, pollen, or contact dermatitis given new soap. -Discussed keeping a food diary in the event symptoms return -Okay to use OTC antihistamine such as Claritin or Zyrtec for the next few days to control symptoms -Okay to use Benadryl though may cause drowsiness.  -OTC cortisone cream if needed for pruritus  Essential hypertension  -Uncontrolled.  Compliance and diet likely a factor -Patient advised to take BP medication upon  returning home -Discussed health risk a/w elevated BP -Discussed obtaining labs - Plan: POCT urinalysis dipstick, CMP  Noncompliance  Cigarette nicotine dependence without complication -smoking 1ppd -Smoking cessation greater than 3 minutes less than 10 minutes -Advised to consider quitting -Consider cutting down -Given handout  Penile edema -Resolved per patient -Discussed possible causes including infection, medications, allergic rxn, etc. -Given strict precautions - Plan: POCT urinalysis dipstick, C.  trachomatis/N. gonorrhoeae RNA, CBC, RPR, HIV  Need for hep C screening -Plan: Hep C antibody  Alcohol abuse -Discussed the importance of cutting down -We will obtain labs -Plan: CBC, folate, vitamin B12, CMP  F/u with PCP next week for HTN  Abbe Amsterdam, MD

## 2020-11-22 LAB — HEPATITIS C ANTIBODY
Hepatitis C Ab: NONREACTIVE
SIGNAL TO CUT-OFF: 0.01 (ref <1.00)

## 2020-11-22 LAB — HIV ANTIBODY (ROUTINE TESTING W REFLEX): HIV 1&2 Ab, 4th Generation: NONREACTIVE

## 2020-11-22 LAB — RPR: RPR Ser Ql: NONREACTIVE

## 2020-11-22 LAB — C. TRACHOMATIS/N. GONORRHOEAE RNA
C. trachomatis RNA, TMA: NOT DETECTED
N. gonorrhoeae RNA, TMA: NOT DETECTED

## 2020-11-24 ENCOUNTER — Telehealth: Payer: Self-pay | Admitting: Internal Medicine

## 2020-11-24 NOTE — Telephone Encounter (Signed)
Patient is returning the call, please advise. CB is [phone]

## 2020-11-25 NOTE — Telephone Encounter (Signed)
Pt notified of update and verbalized understanding.  

## 2020-11-25 NOTE — Telephone Encounter (Signed)
Left detailed message informing  pt to return call. Letter has been mailed out due to not being able to reach pt.

## 2021-05-25 ENCOUNTER — Ambulatory Visit (INDEPENDENT_AMBULATORY_CARE_PROVIDER_SITE_OTHER): Payer: 59 | Admitting: Family Medicine

## 2021-05-25 ENCOUNTER — Other Ambulatory Visit: Payer: Self-pay

## 2021-05-25 ENCOUNTER — Encounter: Payer: Self-pay | Admitting: Family Medicine

## 2021-05-25 ENCOUNTER — Emergency Department (HOSPITAL_COMMUNITY)
Admission: EM | Admit: 2021-05-25 | Discharge: 2021-05-25 | Disposition: A | Discharge status: Home / Self Care | Payer: 59 | Attending: Emergency Medicine | Admitting: Emergency Medicine

## 2021-05-25 ENCOUNTER — Encounter (HOSPITAL_COMMUNITY): Payer: Self-pay | Admitting: Emergency Medicine

## 2021-05-25 VITALS — BP 190/110 | HR 88 | Temp 98.7°F | Wt 198.0 lb

## 2021-05-25 DIAGNOSIS — F1090 Alcohol use, unspecified, uncomplicated: Secondary | ICD-10-CM | POA: Insufficient documentation

## 2021-05-25 DIAGNOSIS — I160 Hypertensive urgency: Secondary | ICD-10-CM

## 2021-05-25 DIAGNOSIS — Z91199 Patient's noncompliance with other medical treatment and regimen due to unspecified reason: Secondary | ICD-10-CM | POA: Diagnosis not present

## 2021-05-25 DIAGNOSIS — Z79899 Other long term (current) drug therapy: Secondary | ICD-10-CM | POA: Insufficient documentation

## 2021-05-25 DIAGNOSIS — F17210 Nicotine dependence, cigarettes, uncomplicated: Secondary | ICD-10-CM | POA: Diagnosis not present

## 2021-05-25 DIAGNOSIS — F1020 Alcohol dependence, uncomplicated: Secondary | ICD-10-CM

## 2021-05-25 DIAGNOSIS — G441 Vascular headache, not elsewhere classified: Secondary | ICD-10-CM

## 2021-05-25 DIAGNOSIS — I10 Essential (primary) hypertension: Secondary | ICD-10-CM

## 2021-05-25 DIAGNOSIS — Z789 Other specified health status: Secondary | ICD-10-CM

## 2021-05-25 DIAGNOSIS — Z9114 Patient's other noncompliance with medication regimen: Secondary | ICD-10-CM

## 2021-05-25 LAB — COMPREHENSIVE METABOLIC PANEL
ALT: 45 U/L — ABNORMAL HIGH (ref 0–44)
AST: 113 U/L — ABNORMAL HIGH (ref 15–41)
Albumin: 4.6 g/dL (ref 3.5–5.0)
Alkaline Phosphatase: 37 U/L — ABNORMAL LOW (ref 38–126)
Anion gap: 11 (ref 5–15)
BUN: 10 mg/dL (ref 6–20)
CO2: 25 mmol/L (ref 22–32)
Calcium: 9.4 mg/dL (ref 8.9–10.3)
Chloride: 101 mmol/L (ref 98–111)
Creatinine, Ser: 0.78 mg/dL (ref 0.61–1.24)
GFR, Estimated: >60 mL/min (ref >60)
Glucose, Bld: 93 mg/dL (ref 70–99)
Potassium: 5.9 mmol/L — ABNORMAL HIGH (ref 3.5–5.1)
Sodium: 137 mmol/L (ref 135–145)
Total Bilirubin: 2.1 mg/dL — ABNORMAL HIGH (ref 0.3–1.2)
Total Protein: 8.1 g/dL (ref 6.5–8.1)

## 2021-05-25 LAB — POTASSIUM: Potassium: 3.7 mmol/L (ref 3.5–5.1)

## 2021-05-25 LAB — RAPID URINE DRUG SCREEN, HOSP PERFORMED
Amphetamines: NOT DETECTED
Barbiturates: NOT DETECTED
Benzodiazepines: NOT DETECTED
Cocaine: NOT DETECTED
Opiates: NOT DETECTED
Tetrahydrocannabinol: NOT DETECTED

## 2021-05-25 LAB — CBC
HCT: 42.2 % (ref 39.0–52.0)
Hemoglobin: 14.1 g/dL (ref 13.0–17.0)
MCH: 32.3 pg (ref 26.0–34.0)
MCHC: 33.4 g/dL (ref 30.0–36.0)
MCV: 96.8 fL (ref 80.0–100.0)
Platelets: 273 10*3/uL (ref 150–400)
RBC: 4.36 MIL/uL (ref 4.22–5.81)
RDW: 14.5 % (ref 11.5–15.5)
WBC: 8 10*3/uL (ref 4.0–10.5)
nRBC: 0 % (ref 0.0–0.2)

## 2021-05-25 MED ORDER — AMLODIPINE BESYLATE 5 MG PO TABS
10.0000 mg | ORAL_TABLET | Freq: Once | ORAL | Status: AC
  Administered 2021-05-25: 10 mg via ORAL
  Filled 2021-05-25: qty 2

## 2021-05-25 MED ORDER — HYDROCHLOROTHIAZIDE 12.5 MG PO TABS
25.0000 mg | ORAL_TABLET | Freq: Once | ORAL | Status: AC
  Administered 2021-05-25: 25 mg via ORAL
  Filled 2021-05-25 (×2): qty 2

## 2021-05-25 NOTE — ED Provider Notes (Addendum)
[facility] DEPT Provider Note   CSN: [id number] Arrival date & time: 05/25/21  1223     History Chief Complaint  Patient presents with   Hypertension   Alcohol Problem    Edwin [name] is a 40 y.o. male.  Patient c/o being sent to ED by PCP as blood pressure was high. States hx htn, and has been non compliant w meds for several weeks/months. Denies headache. No change in speech or vision. No numbness/weakness. No abd pain or nvd. No gu c/o. No swelling. Denies chest pain or discomfort. No unusual doe or sob. Also notes with hx heavy etoh use. Denies hx dts or seizures. Indicates does not feel needs inpatient tx/rehab but would be willing to accept resources/guide.   The history is provided by the patient and medical records.  Hypertension Pertinent negatives include no chest pain, no abdominal pain, no headaches and no shortness of breath.  Alcohol Problem Pertinent negatives include no chest pain, no abdominal pain, no headaches and no shortness of breath.      Past Medical History:  Diagnosis Date   Alcohol abuse    Hypertension    Tobacco abuse     Patient Active Problem List   Diagnosis Date Noted   Hyperlipidemia 07/06/2020   Vitamin D deficiency 10/13/2019   Hypertension    Tobacco abuse    Alcohol abuse     History reviewed. No pertinent surgical history.     Family History  Problem Relation Age of Onset   Hypertension Mother    Hypertension Father     Social History   Tobacco Use   Smoking status: Every Day    Packs/day: 1.00    Types: Cigarettes   Smokeless tobacco: Never  Substance Use Topics   Alcohol use: Yes    Comment: 2 40oz every day   Drug use: No    Home Medications Prior to Admission medications   Medication Sig Start Date End Date Taking? Authorizing Provider  acetaminophen (TYLENOL) 325 MG tablet Take 2 tablets (650 mg total) by mouth every 6 (six) hours as needed. 06/16/15   Tomi Likens, PA-C  amLODipine (NORVASC) 10 MG tablet Take 1 tablet (10 mg total) by mouth daily. 10/09/19   Isaac Bliss, Rayford Halsted, MD  cyclobenzaprine (FLEXERIL) 5 MG tablet Take 1 tablet (5 mg total) by mouth at bedtime. 04/06/20   Billie Ruddy, MD  hydrochlorothiazide (HYDRODIURIL) 25 MG tablet Take 1 tablet (25 mg total) by mouth daily. 11/24/19   Isaac Bliss, Rayford Halsted, MD  HYDROcodone-acetaminophen (NORCO/VICODIN) 5-325 MG tablet Take 1-2 tablets by mouth every 6 (six) hours as needed for moderate pain. 08/30/20   Burchette, Alinda Sierras, MD  ibuprofen (ADVIL,MOTRIN) 800 MG tablet Take 1 tablet (800 mg total) by mouth 3 (three) times daily. Patient taking differently: Take 800 mg by mouth every 6 (six) hours as needed. 02/07/14   Hess, Hessie Diener, PA-C  pantoprazole (PROTONIX) 40 MG tablet Take 1 tablet (40 mg total) by mouth daily. 07/05/20   Isaac Bliss, Rayford Halsted, MD  predniSONE (DELTASONE) 10 MG tablet Begin with 6 tabs on day 1, 5 tab on day 2, 4 tab on day 3, 3 tab on day 4, 2 tab on day 5, 1 tab on day 6-take with food 10/08/20   Wieters, Hallie C, PA-C  tiZANidine (ZANAFLEX) 2 MG tablet Take 1-2 tablets (2-4 mg total) by mouth every 6 (six) hours as needed for muscle spasms. 10/08/20  Wieters, Rocky Comfort C, PA-C    Allergies    Patient has no known allergies.  Review of Systems   Review of Systems  Constitutional:  Negative for fever.  HENT:  Negative for sore throat.   Eyes:  Negative for visual disturbance.  Respiratory:  Negative for shortness of breath.   Cardiovascular:  Negative for chest pain and leg swelling.  Gastrointestinal:  Negative for abdominal pain, diarrhea and vomiting.  Genitourinary:  Negative for flank pain.  Musculoskeletal:  Negative for back pain and neck pain.  Skin:  Negative for rash.  Neurological:  Negative for speech difficulty, weakness, numbness and headaches.  Hematological:  Does not bruise/bleed easily.  Psychiatric/Behavioral:  Negative for confusion.     Physical Exam Updated Vital Signs BP (!) 161/101 (BP Location: Right Arm)    Pulse 94    Temp 98.1 F (36.7 C) (Oral)    Resp 15    Ht 1.803 m (5\' 11" )    Wt 89.8 kg    SpO2 97%    BMI 27.62 kg/m   Physical Exam Vitals and nursing note reviewed.  Constitutional:      Appearance: Normal appearance. He is well-developed.  HENT:     Head: Atraumatic.     Nose: Nose normal.     Mouth/Throat:     Mouth: Mucous membranes are moist.     Pharynx: Oropharynx is clear.  Eyes:     General: No scleral icterus.    Conjunctiva/sclera: Conjunctivae normal.     Pupils: Pupils are equal, round, and reactive to light.  Neck:     Trachea: No tracheal deviation.  Cardiovascular:     Rate and Rhythm: Normal rate and regular rhythm.     Pulses: Normal pulses.     Heart sounds: Normal heart sounds. No murmur heard.   No friction rub. No gallop.  Pulmonary:     Effort: Pulmonary effort is normal. No accessory muscle usage or respiratory distress.     Breath sounds: Normal breath sounds.  Abdominal:     General: Bowel sounds are normal. There is no distension.     Palpations: Abdomen is soft.     Tenderness: There is no abdominal tenderness.  Genitourinary:    Comments: No cva tenderness. Musculoskeletal:        General: No swelling.     Cervical back: Normal range of motion and neck supple. No rigidity.     Right lower leg: No edema.     Left lower leg: No edema.  Skin:    General: Skin is warm and dry.     Findings: No rash.  Neurological:     Mental Status: He is alert.     Comments: Alert, speech clear. Motor/sens grossly intact. Steady gait.   Psychiatric:        Mood and Affect: Mood normal.    ED Results / Procedures / Treatments   Labs (all labs ordered are listed, but only abnormal results are displayed) Results for orders placed or performed during the hospital encounter of 05/25/21  Comprehensive metabolic panel  Result Value Ref Range   Sodium 137 135 - 145 mmol/L    Potassium 5.9 (H) 3.5 - 5.1 mmol/L   Chloride 101 98 - 111 mmol/L   CO2 25 22 - 32 mmol/L   Glucose, Bld 93 70 - 99 mg/dL   BUN 10 6 - 20 mg/dL   Creatinine, Ser 05/27/21 0.61 - 1.24 mg/dL   Calcium 9.4 8.9 -  10.3 mg/dL   Total Protein 8.1 6.5 - 8.1 g/dL   Albumin 4.6 3.5 - 5.0 g/dL   AST 113 (H) 15 - 41 U/L   ALT 45 (H) 0 - 44 U/L   Alkaline Phosphatase 37 (L) 38 - 126 U/L   Total Bilirubin 2.1 (H) 0.3 - 1.2 mg/dL   GFR, Estimated >60 >60 mL/min   Anion gap 11 5 - 15  cbc  Result Value Ref Range   WBC 8.0 4.0 - 10.5 K/uL   RBC 4.36 4.22 - 5.81 MIL/uL   Hemoglobin 14.1 13.0 - 17.0 g/dL   HCT 42.2 39.0 - 52.0 %   MCV 96.8 80.0 - 100.0 fL   MCH 32.3 26.0 - 34.0 pg   MCHC 33.4 30.0 - 36.0 g/dL   RDW 14.5 11.5 - 15.5 %   Platelets 273 150 - 400 K/uL   nRBC 0.0 0.0 - 0.2 %  Rapid urine drug screen (hospital performed)  Result Value Ref Range   Opiates NONE DETECTED NONE DETECTED   Cocaine NONE DETECTED NONE DETECTED   Benzodiazepines NONE DETECTED NONE DETECTED   Amphetamines NONE DETECTED NONE DETECTED   Tetrahydrocannabinol NONE DETECTED NONE DETECTED   Barbiturates NONE DETECTED NONE DETECTED  Potassium  Result Value Ref Range   Potassium 3.7 3.5 - 5.1 mmol/L    EKG None  Radiology No results found.  Procedures Procedures   Medications Ordered in ED Medications  amLODipine (NORVASC) tablet 10 mg (10 mg Oral Given 05/25/21 1345)  hydrochlorothiazide (HYDRODIURIL) tablet 25 mg (25 mg Oral Given 05/25/21 1345)    ED Course  I have reviewed the triage vital signs and the nursing notes.  Pertinent labs & imaging results that were available during my care of the patient were reviewed by me and considered in my medical decision making (see chart for details).    MDM Rules/Calculators/A&P                          Labs sent.   Reviewed nursing notes and prior charts for additional history.   Labs reviewed/interpreted by me - ?k high., ?hemolyzed as renal fxn  normal - will redraw blood and recheck.   Additional labs reviewed/interpreted by me - k normal.   Pt indicates has adequate of his meds/bp meds at home.   Pt currently appears stable for d/c.   Rec pcp f/u.  Return precautions provided.      Final Clinical Impression(s) / ED Diagnoses Final diagnoses:  None    Rx / DC Orders ED Discharge Orders     None          Lajean Saver, MD 05/25/21 1640

## 2021-05-25 NOTE — Discharge Instructions (Addendum)
It was our pleasure to provide your ER care today - we hope that you feel better.  Your blood pressure is high today - make sure to take your blood pressure meds as prescribed, limit salt intake, and follow up with primary care doctor in the next couple of weeks.   For alcohol use issues - see resource guide provided to help access treatment program(s).   Return to ER if worse, new symptoms, fevers, chest pain, trouble breathing, or other concern.

## 2021-05-25 NOTE — ED Triage Notes (Signed)
Patient sent by PCP d/t high BP reading of 190/110 today. Patient reports taking HCTZ and amlodipine "from time to time". Patient also reports alcohol abuse and wants help for it. He reports drinking a fifth of tequila per day, states last drink was yesterday. Denies hx alcohol-related withdrawal.

## 2021-05-25 NOTE — ED Notes (Signed)
Coming from PCP-states patient has not been taking BP meds-elevated BP causing patient to have a H/A-also, drinking 1/5 of Tequila QD-sending over for eval

## 2021-05-25 NOTE — Progress Notes (Signed)
Subjective:    Patient ID: Edwin [name], male    DOB: 09/29/1980, 40 y.o.   MRN: [id number]  Chief Complaint  Patient presents with   Headache    More like pain on right side of face for 3 days, near the temple feela like a slight headache, has not taken anything for the pain.      HPI Patient is a 40 year old male with pmh sig for HTN, EtOH abuse, HLD, tobacco abuse, vitamin D deficiency followed by Dr. Ardyth Harps and seen today for acute concern.  Pt endorses dull headache and right temple x3 days.  Pt taking BP medications intermittently.  May have last taken meds 3 days ago prior to HA starting.  Pt does not like taking his blood pressure medication as it "does not go well" with his drinking.  Drinking 1/5 of tequila per day.  Will drink beer if out of liquor on Sunday.  Pt endorses having eye-opener, other people commenting on his drinking/feeling annoyed by other comments, pt feeling like he needs to cut down.  Pt open to rehab.  States started drinking after the death of his mother.  Pt states last time he drank and took his blood pressure medication he had chest pain.  Checked bp at drug store and saw it was high.  Past Medical History:  Diagnosis Date   Alcohol abuse    Hypertension    Tobacco abuse     No Known Allergies  ROS General: Denies fever, chills, night sweats, changes in weight, changes in appetite  HEENT: Denies  ear pain, changes in vision, rhinorrhea, sore throat  +HA CV: Denies palpitations, SOB, orthopnea  +intermittent CP, "jittery feeling in chest" Pulm: Denies SOB, cough, wheezing GI: Denies abdominal pain, nausea, vomiting, diarrhea, constipation GU: Denies dysuria, hematuria, frequency Msk: Denies muscle cramping, joint pains Neuro: Denies weakness + intermittent numbness tingling in extremities Skin: Denies rashes, bruising Psych: Denies depression, anxiety, hallucinations    Objective:    Blood pressure (!) 190/110, pulse 88, temperature 98.7 F  (37.1 C), temperature source Oral, weight 198 lb (89.8 kg), SpO2 97 %.  Gen. Pleasant, well-nourished, in no distress, normal affect   HEENT: [place]/AT, face symmetric, conjunctiva clear, no scleral icterus, PERRLA, EOMI, nares patent without drainage, pharynx without erythema or exudate. Neck: No JVD, no thyromegaly, no carotid bruits Lungs: no accessory muscle use, CTAB, no wheezes or rales Cardiovascular: RRR, no m/r/g, no peripheral edema Musculoskeletal: No deformities, no cyanosis or clubbing, normal tone Neuro:  A&Ox3, CN II-XII intact, normal gait.  Tremor in Bl UEs Skin:  Warm, no lesions/ rash   Wt Readings from Last 3 Encounters:  05/25/21 198 lb (89.8 kg)  11/21/20 203 lb 6.4 oz (92.3 kg)  09/02/20 211 lb 8 oz (95.9 kg)    Lab Results  Component Value Date   WBC 10.0 11/21/2020   HGB 14.1 11/21/2020   HCT 41.8 11/21/2020   PLT 207.0 11/21/2020   GLUCOSE 82 11/21/2020   CHOL 219 (H) 07/05/2020   TRIG 68.0 07/05/2020   HDL 64.40 07/05/2020   LDLCALC 141 (H) 07/05/2020   ALT 54 (H) 11/21/2020   AST 24 11/21/2020   NA 141 11/21/2020   K 3.8 11/21/2020   CL 100 11/21/2020   CREATININE 0.70 11/21/2020   BUN 11 11/21/2020   CO2 30 11/21/2020   TSH 1.38 07/05/2020   HGBA1C 4.8 07/05/2020    Assessment/Plan:  Hypertensive urgency  -bp 190/110 -currently asymptomatic -Has not taken BP  meds this morning -EKG done in office NSR, normal axis, ST elevation in V2-cannot rule out ischemia though likely 2/2 HTN, T wave inversion in lead III, Q waves noted in I, aVL, aVF, RR' in lead III  (Previously seen on EKG from 06/03/2017). -Given history of EtOH use and elevated BP patient advised to proceed to nearest emergency department for further evaluation. - Plan: EKG 12-Lead  Other vascular headache -2/2 hypertension -Discussed importance of lifestyle modifications and medication compliance  Alcohol dependence, daily use (HCC)  -Alcohol use counseling greater than 15  minutes, less than 30 minutes -Patient currently drinking 1/5 of tequila per day.  Interested in cutting down in rehab. -History of elevated LFTs as ALT 54 on 11/21/2020 - Plan: EKG 12-Lead  Noncompliance -Discussed the importance of taking medications daily  Given current symptoms and history of alcohol dependence, patient advised to proceed to nearest emergency department for further evaluation.  ED notified.  F/u with PCP as needed.  Grier Mitts, MD

## 2021-05-25 NOTE — ED Provider Notes (Signed)
Emergency Medicine Provider Triage Evaluation Note  Edwin [name] , a 40 y.o. male  was evaluated in triage.  Pt complains of hypertension.  Unsure when he last took his blood pressure medicine, did not take it today.  Seen by his primary care doctor who sent him here for hypertensive urgency.  Patient has been having headache intermittently for 4 days.  Also worsened by his alcohol consumption, last drink at 1 AM this morning.  Drank about 1/5 of tequila.  No SI HI, no tremors..  No chest pain, vision changes, shortness of breath, blurry vision, numbness  Review of Systems  Positive: ABOVE Negative: ABOVE  Physical Exam  BP (!) 170/128 (BP Location: Right Arm)    Pulse 87    Temp 98.1 F (36.7 C) (Oral)    Resp 18    Ht 5\' 11"  (1.803 m)    Wt 89.8 kg    SpO2 98%    BMI 27.62 kg/m  Gen:   Awake, no distress   Resp:  Normal effort  MSK:   Moves extremities without difficulty  Other:  Abdomen soft nontender  Medical Decision Making  Medically screening exam initiated at 12:41 PM.  Appropriate orders placed.  Taras Rask was informed that the remainder of the evaluation will be completed by another provider, this initial triage assessment does not replace that evaluation, and the importance of remaining in the ED until their evaluation is complete.  Basic labs. Ordered home meds.    Kerney Elbe, PA-C 05/25/21 1242    [place], 05/27/21, DO 05/25/21 1726

## 2021-08-01 ENCOUNTER — Encounter: Payer: Self-pay | Admitting: Internal Medicine

## 2021-08-01 ENCOUNTER — Ambulatory Visit (INDEPENDENT_AMBULATORY_CARE_PROVIDER_SITE_OTHER): Payer: Self-pay | Admitting: Internal Medicine

## 2021-08-01 VITALS — BP 180/130 | HR 88 | Temp 98.1°F | Ht 72.0 in | Wt 207.1 lb

## 2021-08-01 DIAGNOSIS — I10 Essential (primary) hypertension: Secondary | ICD-10-CM

## 2021-08-01 DIAGNOSIS — H539 Unspecified visual disturbance: Secondary | ICD-10-CM

## 2021-08-01 DIAGNOSIS — E559 Vitamin D deficiency, unspecified: Secondary | ICD-10-CM

## 2021-08-01 DIAGNOSIS — F1010 Alcohol abuse, uncomplicated: Secondary | ICD-10-CM

## 2021-08-01 DIAGNOSIS — E782 Mixed hyperlipidemia: Secondary | ICD-10-CM

## 2021-08-01 LAB — LIPID PANEL
Cholesterol: 226 mg/dL — ABNORMAL HIGH (ref 0–200)
HDL: 78 mg/dL (ref >39.00)
LDL Cholesterol: 128 mg/dL — ABNORMAL HIGH (ref 0–99)
NonHDL: 147.75
Total CHOL/HDL Ratio: 3
Triglycerides: 101 mg/dL (ref 0.0–149.0)
VLDL: 20.2 mg/dL (ref 0.0–40.0)

## 2021-08-01 LAB — CBC WITH DIFFERENTIAL/PLATELET
Basophils Absolute: 0.1 10*3/uL (ref 0.0–0.1)
Basophils Relative: 1.3 % (ref 0.0–3.0)
Eosinophils Absolute: 0.1 10*3/uL (ref 0.0–0.7)
Eosinophils Relative: 1.4 % (ref 0.0–5.0)
HCT: 40.9 % (ref 39.0–52.0)
Hemoglobin: 13.5 g/dL (ref 13.0–17.0)
Lymphocytes Relative: 21 % (ref 12.0–46.0)
Lymphs Abs: 1.7 10*3/uL (ref 0.7–4.0)
MCHC: 33.1 g/dL (ref 30.0–36.0)
MCV: 97.4 fl (ref 78.0–100.0)
Monocytes Absolute: 1.2 10*3/uL — ABNORMAL HIGH (ref 0.1–1.0)
Monocytes Relative: 15 % — ABNORMAL HIGH (ref 3.0–12.0)
Neutro Abs: 5.1 10*3/uL (ref 1.4–7.7)
Neutrophils Relative %: 61.3 % (ref 43.0–77.0)
Platelets: 243 10*3/uL (ref 150.0–400.0)
RBC: 4.2 Mil/uL — ABNORMAL LOW (ref 4.22–5.81)
RDW: 12.6 % (ref 11.5–15.5)
WBC: 8.3 10*3/uL (ref 4.0–10.5)

## 2021-08-01 LAB — COMPREHENSIVE METABOLIC PANEL
ALT: 20 U/L (ref 0–53)
AST: 25 U/L (ref 0–37)
Albumin: 4.5 g/dL (ref 3.5–5.2)
Alkaline Phosphatase: 41 U/L (ref 39–117)
BUN: 10 mg/dL (ref 6–23)
CO2: 31 mEq/L (ref 19–32)
Calcium: 9.6 mg/dL (ref 8.4–10.5)
Chloride: 101 mEq/L (ref 96–112)
Creatinine, Ser: 0.79 mg/dL (ref 0.40–1.50)
GFR: 112.34 mL/min (ref >60.00)
Glucose, Bld: 106 mg/dL — ABNORMAL HIGH (ref 70–99)
Potassium: 3.8 mEq/L (ref 3.5–5.1)
Sodium: 139 mEq/L (ref 135–145)
Total Bilirubin: 0.5 mg/dL (ref 0.2–1.2)
Total Protein: 7.4 g/dL (ref 6.0–8.3)

## 2021-08-01 LAB — HEMOGLOBIN A1C: Hgb A1c MFr Bld: 4.7 % (ref 4.6–6.5)

## 2021-08-01 LAB — VITAMIN D 25 HYDROXY (VIT D DEFICIENCY, FRACTURES): VITD: 17.32 ng/mL — ABNORMAL LOW (ref 30.00–100.00)

## 2021-08-01 LAB — VITAMIN B12: Vitamin B-12: 446 pg/mL (ref 211–911)

## 2021-08-01 MED ORDER — HYDROCHLOROTHIAZIDE 25 MG PO TABS: 25.0000 mg | ORAL_TABLET | Freq: Every day | ORAL | 1 refills | Status: DC

## 2021-08-01 MED ORDER — AMLODIPINE BESYLATE 10 MG PO TABS: 10.0000 mg | ORAL_TABLET | Freq: Every day | ORAL | 1 refills | Status: DC

## 2021-08-01 NOTE — Patient Instructions (Signed)
-  Nice seeing you today!!  -Lab work today; will notify you once results are available.  -Make sure you are taking HCTZ 25 mg daily and amlodipine 10 mg daily for your blood pressure.  -Schedule follow up in 2 weeks.  -If you develop CP, SOB, leg swelling, arm/leg weakness or severe HA, please go to the ED for evaluation.

## 2021-08-01 NOTE — Progress Notes (Signed)
Established Patient Office Visit     This visit occurred during the SARS-CoV-2 public health emergency.  Safety protocols were in place, including screening questions prior to the visit, additional usage of staff PPE, and extensive cleaning of exam room while observing appropriate contact time as indicated for disinfecting solutions.    CC/Reason for Visit: Follow-up chronic conditions  HPI: Edwin [name] is a 41 y.o. male who is coming in today for the above mentioned reasons. Past Medical History is significant for: Malignant hypertension, ongoing tobacco and alcohol use, hyperlipidemia, vitamin D deficiency.  He has been feeling well and has no acute concerns.  States he is here just for a checkup".  He continues to drink excessively, an average day of alcohol consumption for him looks like a 24 ounce beer in addition to a pint of tequila.  This is starting to affect his relationships at home and at work.  He has been told that he drinks excessively.  He recognizes this is an issue.  He states he is not compliant with blood pressure medication.  He is supposed to be on hydrochlorothiazide 25 mg and amlodipine 10 mg daily.  He declines chest pain, shortness of breath, dyspnea on exertion, dizziness, headache, focal neurologic deficit, leg edema.  2 in office blood pressure measurements today are 190/140, 180/130.  He has been experiencing some blurry vision, has not seen an eye doctor in years.   Past Medical/Surgical History: Past Medical History:  Diagnosis Date   Alcohol abuse    Hypertension    Tobacco abuse     No past surgical history on file.  Social History:  reports that he has been smoking cigarettes. He has been smoking an average of 1 pack per day. He has never used smokeless tobacco. He reports current alcohol use. He reports that he does not use drugs.  Allergies: No Known Allergies  Family History:  Family History  Problem Relation Age of Onset    Hypertension Mother    Hypertension Father      Current Outpatient Medications:    acetaminophen (TYLENOL) 325 MG tablet, Take 2 tablets (650 mg total) by mouth every 6 (six) hours as needed., Disp: 30 tablet, Rfl: 0   cyclobenzaprine (FLEXERIL) 5 MG tablet, Take 1 tablet (5 mg total) by mouth at bedtime., Disp: 15 tablet, Rfl: 0   HYDROcodone-acetaminophen (NORCO/VICODIN) 5-325 MG tablet, Take 1-2 tablets by mouth every 6 (six) hours as needed for moderate pain., Disp: 30 tablet, Rfl: 0   ibuprofen (ADVIL,MOTRIN) 800 MG tablet, Take 1 tablet (800 mg total) by mouth 3 (three) times daily. (Patient taking differently: Take 800 mg by mouth every 6 (six) hours as needed.), Disp: 21 tablet, Rfl: 0   pantoprazole (PROTONIX) 40 MG tablet, Take 1 tablet (40 mg total) by mouth daily., Disp: 90 tablet, Rfl: 1   predniSONE (DELTASONE) 10 MG tablet, Begin with 6 tabs on day 1, 5 tab on day 2, 4 tab on day 3, 3 tab on day 4, 2 tab on day 5, 1 tab on day 6-take with food, Disp: 21 tablet, Rfl: 0   tiZANidine (ZANAFLEX) 2 MG tablet, Take 1-2 tablets (2-4 mg total) by mouth every 6 (six) hours as needed for muscle spasms., Disp: 30 tablet, Rfl: 0   amLODipine (NORVASC) 10 MG tablet, Take 1 tablet (10 mg total) by mouth daily., Disp: 90 tablet, Rfl: 1   hydrochlorothiazide (HYDRODIURIL) 25 MG tablet, Take 1 tablet (25 mg total) by  mouth daily., Disp: 90 tablet, Rfl: 1  Review of Systems:  Constitutional: Denies fever, chills, diaphoresis, appetite change and fatigue.  HEENT: Denies photophobia, eye pain, redness, hearing loss, ear pain, congestion, sore throat, rhinorrhea, sneezing, mouth sores, trouble swallowing, neck pain, neck stiffness and tinnitus.   Respiratory: Denies SOB, DOE, cough, chest tightness,  and wheezing.   Cardiovascular: Denies chest pain, palpitations and leg swelling.  Gastrointestinal: Denies nausea, vomiting, abdominal pain, diarrhea, constipation, blood in stool and abdominal  distention.  Genitourinary: Denies dysuria, urgency, frequency, hematuria, flank pain and difficulty urinating.  Endocrine: Denies: hot or cold intolerance, sweats, changes in hair or nails, polyuria, polydipsia. Musculoskeletal: Denies myalgias, back pain, joint swelling, arthralgias and gait problem.  Skin: Denies pallor, rash and wound.  Neurological: Denies dizziness, seizures, syncope, weakness, light-headedness, numbness and headaches.  Hematological: Denies adenopathy. Easy bruising, personal or family bleeding history  Psychiatric/Behavioral: Denies suicidal ideation, mood changes, confusion, nervousness, sleep disturbance and agitation    Physical Exam: Vitals:   08/01/21 0937 08/01/21 1002  BP: (!) 190/140 (!) 180/130  Pulse: 88   Temp: 98.1 F (36.7 C)   TempSrc: Oral   SpO2: 98%   Weight: 207 lb 1.6 oz (93.9 kg)   Height: 6' (1.829 m)     Body mass index is 28.09 kg/m.   Constitutional: NAD, calm, comfortable Eyes: PERRL, lids and conjunctivae normal ENMT: Mucous membranes are moist. Respiratory: clear to auscultation bilaterally, no wheezing, no crackles. Normal respiratory effort. No accessory muscle use.  Cardiovascular: Regular rate and rhythm, no murmurs / rubs / gallops. No extremity edema.  Neurologic: Grossly intact and nonfocal Psychiatric: Normal judgment and insight. Alert and oriented x 3. Normal mood.    Impression and Plan:  Vision changes - Plan: Ambulatory referral to Ophthalmology  Primary hypertension - Plan: CBC with Differential/Platelet, Comprehensive metabolic panel, Hemoglobin A1c  Mixed hyperlipidemia - Plan: Lipid panel  Vitamin D deficiency - Plan: VITAMIN D 25 Hydroxy (Vit-D Deficiency, Fractures)  Alcohol abuse - Plan: AMB Referral to [facility] Care Coordinaton, Vitamin B12  Essential hypertension - Plan: hydrochlorothiazide (HYDRODIURIL) 25 MG tablet, amLODipine (NORVASC) 10 MG tablet  -He has extremely elevated blood  pressure to 190/140, 180/130 on 2 separate in office measurements. -He is known to be noncompliant with medication in addition is a significant alcohol user. -We discussed going to the emergency department today, however given he has no signs of endorgan damage (blurry vision has been present for over a year), he has instead decided to be compliant with home medication and follow-up with me in 2 weeks.  He understands that if he develops chest pain, shortness of breath, lower extremity edema, significant headache or focal neurologic deficit in the interim he should proceed to the emergency department immediately. -I will order labs today including CBC, CMP and lipid panel. -He will also be referred to ophthalmology for his vision issues. -I will also involve our embedded social worker for assistance with alcohol usage.  Time spent: 34 minutes reviewing chart, interviewing and examining patient, discussing options for immediate care, enforcing importance of medication compliance and alcohol cessation.   Patient Instructions  -Nice seeing you today!!  -Lab work today; will notify you once results are available.  -Make sure you are taking HCTZ 25 mg daily and amlodipine 10 mg daily for your blood pressure.  -Schedule follow up in 2 weeks.  -If you develop CP, SOB, leg swelling, arm/leg weakness or severe HA, please go to the ED for evaluation.  Lelon Frohlich, MD [facility]

## 2021-08-02 ENCOUNTER — Telehealth: Payer: Self-pay | Admitting: *Deleted

## 2021-08-02 ENCOUNTER — Other Ambulatory Visit: Payer: Self-pay | Admitting: Internal Medicine

## 2021-08-02 DIAGNOSIS — E559 Vitamin D deficiency, unspecified: Secondary | ICD-10-CM

## 2021-08-02 MED ORDER — VITAMIN D (ERGOCALCIFEROL) 1.25 MG (50000 UNIT) PO CAPS: 50000.0000 [IU] | ORAL_CAPSULE | ORAL | 0 refills | Status: AC

## 2021-08-02 NOTE — Chronic Care Management (AMB) (Signed)
°  Care Management   Note  08/02/2021 Name: Edwin [name] MRN: [id number] DOB: Aug 20, 1980  Edwin [name] is a 41 y.o. year old male who is a primary care patient of Philip Aspen, Limmie Patricia, MD. I reached out to Kerney Elbe by phone today in response to a referral sent by Mr. Edwin [name]'s primary care provider.   [name] was given information about care management services today including:  Care management services include personalized support from designated clinical staff supervised by his physician, including individualized plan of care and coordination with other care providers 24/7 contact phone numbers for assistance for urgent and routine care needs. The patient may stop care management services at any time by phone call to the office staff.  Patient agreed to services and verbal consent obtained.   Follow up plan: Telephone appointment with care management team member scheduled for:08/04/21  [facility] Guide, Embedded Care Coordination [facility]   Care Management  Direct Dial: [phone]

## 2021-08-02 NOTE — Chronic Care Management (AMB) (Signed)
°  Care Management   Outreach Note  08/02/2021 Name: Clair Diab MRN: [id number] DOB: Oct 05, 1980  Referred by: Isaac Bliss, Rayford Halsted, MD Reason for referral : Care Coordination (Initial outreach to schedule referral with SW )   An unsuccessful telephone outreach was attempted today. The patient was referred to the case management team for assistance with care management and care coordination.   Follow Up Plan:  A HIPAA compliant phone message was left for the patient providing contact information and requesting a return call.  The care management team will reach out to the patient again over the next 7 days.  If patient returns call to provider office, please advise to call [place]* at [phone].*  [place] Management  Direct Dial: [phone]

## 2021-08-04 ENCOUNTER — Telehealth: Payer: Self-pay

## 2021-08-07 ENCOUNTER — Telehealth: Payer: Self-pay | Admitting: Licensed Clinical Social Worker

## 2021-08-07 NOTE — Telephone Encounter (Signed)
° °   Clinical Social Work  Care Management   Phone Outreach    08/07/2021 Name: Edwin [name] MRN: [id number] DOB: Aug 14, 1980  Edwin [name] is a 41 y.o. year old male who is a primary care patient of Philip Aspen, Limmie Patricia, MD .   Reason for referral: [facility]  and Mental Health Counseling and Resources.    CCM LCSW reached out to patient today by phone to introduce self, assess needs and offer Care Management services and interventions.    Telephone outreach was unsuccessful. A HIPPA compliant phone message was left for the patient providing contact information and requesting a return call.   Plan:CCM LCSW will wait for return call. If no return call is received, Will route chart to Care Guide to see if patient would like to reschedule phone appointment   Review of patient status, including review of consultants reports, relevant laboratory and other test results, and collaboration with appropriate care team members and the patient's provider was performed as part of comprehensive patient evaluation and provision of care management services.    Jenel Lucks, MSW, LCSW [facility]   [email or web address] Phone [phone] 6:23 AM

## 2021-08-14 NOTE — Telephone Encounter (Signed)
Rescheduled 08/31/21 ? ?Edwin [name]  ?Care Guide, Embedded Care Coordination ?[place]  Care Management  ?Direct Dial: [phone] ? ?

## 2021-08-15 ENCOUNTER — Ambulatory Visit (INDEPENDENT_AMBULATORY_CARE_PROVIDER_SITE_OTHER): Payer: Self-pay | Admitting: Internal Medicine

## 2021-08-15 ENCOUNTER — Encounter: Payer: Self-pay | Admitting: Internal Medicine

## 2021-08-15 VITALS — BP 170/120 | HR 111 | Temp 98.6°F | Wt 208.6 lb

## 2021-08-15 DIAGNOSIS — F1010 Alcohol abuse, uncomplicated: Secondary | ICD-10-CM

## 2021-08-15 DIAGNOSIS — I10 Essential (primary) hypertension: Secondary | ICD-10-CM

## 2021-08-15 DIAGNOSIS — E782 Mixed hyperlipidemia: Secondary | ICD-10-CM

## 2021-08-15 DIAGNOSIS — Z720 Tobacco use: Secondary | ICD-10-CM

## 2021-08-15 MED ORDER — VALSARTAN-HYDROCHLOROTHIAZIDE 160-25 MG PO TABS: 1.0000 | ORAL_TABLET | Freq: Every day | ORAL | 1 refills | Status: DC

## 2021-08-15 NOTE — Patient Instructions (Signed)
-  Nice seeing you today!! ? ?-Stop HCTZ. ? ?-Start diovan HCT 1 tablet daily in addition to your amlodipine. ? ?-Make sure you follow up with the hypertension clinic. ? ?-See you back in 6 weeks. ?

## 2021-08-15 NOTE — Progress Notes (Signed)
? ? ? ?Established Patient Office Visit ? ? ? ? ?This visit occurred during the SARS-CoV-2 public health emergency.  Safety protocols were in place, including screening questions prior to the visit, additional usage of staff PPE, and extensive cleaning of exam room while observing appropriate contact time as indicated for disinfecting solutions.  ? ? ?CC/Reason for Visit: Follow-up blood pressure ? ?HPI: Edwin [name] is a 41 y.o. male who is coming in today for the above mentioned reasons. Past Medical History is significant for: Malignant hypertension, hyperlipidemia, ongoing tobacco and alcohol abuse.  He is adamant he does not use illicit substances like cocaine.  He was seen 2 weeks ago with very elevated blood pressures, however that time he was not compliant with his medication therapy.  During that visit his blood pressure was 190/140.  We discussed importance of medication compliance and ever since then he has been taking his medication as prescribed which include amlodipine 10 mg and hydrochlorothiazide 25 mg daily.  He is here today for 2-week follow-up.  He declines chest pain, shortness of breath, focal neurologic weakness, headache, confusion. ? ? ?Past Medical/Surgical History: ?Past Medical History:  ?Diagnosis Date  ? Alcohol abuse   ? Hypertension   ? Tobacco abuse   ? ? ?No past surgical history on file. ? ?Social History: ? reports that he has been smoking cigarettes. He has been smoking an average of 1 pack per day. He has never used smokeless tobacco. He reports current alcohol use. He reports that he does not use drugs. ? ?Allergies: ?No Known Allergies ? ?Family History:  ?Family History  ?Problem Relation Age of Onset  ? Hypertension Mother   ? Hypertension Father   ? ? ? ?Current Outpatient Medications:  ?  acetaminophen (TYLENOL) 325 MG tablet, Take 2 tablets (650 mg total) by mouth every 6 (six) hours as needed., Disp: 30 tablet, Rfl: 0 ?  amLODipine (NORVASC) 10 MG tablet, Take 1  tablet (10 mg total) by mouth daily., Disp: 90 tablet, Rfl: 1 ?  cyclobenzaprine (FLEXERIL) 5 MG tablet, Take 1 tablet (5 mg total) by mouth at bedtime., Disp: 15 tablet, Rfl: 0 ?  HYDROcodone-acetaminophen (NORCO/VICODIN) 5-325 MG tablet, Take 1-2 tablets by mouth every 6 (six) hours as needed for moderate pain., Disp: 30 tablet, Rfl: 0 ?  ibuprofen (ADVIL,MOTRIN) 800 MG tablet, Take 1 tablet (800 mg total) by mouth 3 (three) times daily. (Patient taking differently: Take 800 mg by mouth every 6 (six) hours as needed.), Disp: 21 tablet, Rfl: 0 ?  pantoprazole (PROTONIX) 40 MG tablet, Take 1 tablet (40 mg total) by mouth daily., Disp: 90 tablet, Rfl: 1 ?  predniSONE (DELTASONE) 10 MG tablet, Begin with 6 tabs on day 1, 5 tab on day 2, 4 tab on day 3, 3 tab on day 4, 2 tab on day 5, 1 tab on day 6-take with food, Disp: 21 tablet, Rfl: 0 ?  tiZANidine (ZANAFLEX) 2 MG tablet, Take 1-2 tablets (2-4 mg total) by mouth every 6 (six) hours as needed for muscle spasms., Disp: 30 tablet, Rfl: 0 ?  valsartan-hydrochlorothiazide (DIOVAN-HCT) 160-25 MG tablet, Take 1 tablet by mouth daily., Disp: 90 tablet, Rfl: 1 ?  Vitamin D, Ergocalciferol, (DRISDOL) 1.25 MG (50000 UNIT) CAPS capsule, Take 1 capsule (50,000 Units total) by mouth every 7 (seven) days for 12 doses., Disp: 12 capsule, Rfl: 0 ? ?Review of Systems:  ?Constitutional: Denies fever, chills, diaphoresis, appetite change and fatigue.  ?HEENT: Denies photophobia, eye pain,  redness, hearing loss, ear pain, congestion, sore throat, rhinorrhea, sneezing, mouth sores, trouble swallowing, neck pain, neck stiffness and tinnitus.   ?Respiratory: Denies SOB, DOE, cough, chest tightness,  and wheezing.   ?Cardiovascular: Denies chest pain, palpitations and leg swelling.  ?Gastrointestinal: Denies nausea, vomiting, abdominal pain, diarrhea, constipation, blood in stool and abdominal distention.  ?Genitourinary: Denies dysuria, urgency, frequency, hematuria, flank pain and  difficulty urinating.  ?Endocrine: Denies: hot or cold intolerance, sweats, changes in hair or nails, polyuria, polydipsia. ?Musculoskeletal: Denies myalgias, back pain, joint swelling, arthralgias and gait problem.  ?Skin: Denies pallor, rash and wound.  ?Neurological: Denies dizziness, seizures, syncope, weakness, light-headedness, numbness and headaches.  ?Hematological: Denies adenopathy. Easy bruising, personal or family bleeding history  ?Psychiatric/Behavioral: Denies suicidal ideation, mood changes, confusion, nervousness, sleep disturbance and agitation ? ? ? ?Physical Exam: ?Vitals:  ? 08/15/21 0917  ?BP: (!) 170/120  ?Pulse: (!) 111  ?Temp: 98.6 ?F (37 ?C)  ?TempSrc: Oral  ?SpO2: 100%  ?Weight: 208 lb 9.6 oz (94.6 kg)  ? ? ?Body mass index is 28.29 kg/m?. ? ? ?Constitutional: NAD, calm, comfortable ?Eyes: PERRL, lids and conjunctivae normal ?ENMT: Mucous membranes are moist.  ?Respiratory: clear to auscultation bilaterally, no wheezing, no crackles. Normal respiratory effort. No accessory muscle use.  ?Cardiovascular: Regular rate and rhythm, no murmurs / rubs / gallops. No extremity edema.  ?Neurologic: Grossly intact and nonfocal ?Psychiatric: Normal judgment and insight. Alert and oriented x 3. Normal mood.  ? ? ?Impression and Plan: ? ?Malignant hypertension - Plan: valsartan-hydrochlorothiazide (DIOVAN-HCT) 160-25 MG tablet, Ambulatory referral to [facility] - [facility] [place] ? ?Mixed hyperlipidemia ? ?Tobacco abuse ? ?Alcohol abuse ? ?-Blood pressure is 170/120 today on 2 separate in office measurements. ?-Continue amlodipine 10 mg, discontinue hydrochlorothiazide and start Diovan HCT 160/25 mg. ?-I will refer him to the advanced hypertension clinic as I believe it is time to discuss potential secondary causes of hypertension. ?-His father ended up on dialysis and his mother had heart failure presumably due to blood pressure issues and he is trying to avoid this. ?-We have  discussed red flag symptoms that would promote urgent ED evaluation, he understands and agrees. ? ?Time spent: 34 minutes reviewing chart, interviewing and examining patient and formulating plan of care. ? ? ?Patient Instructions  ?-Nice seeing you today!! ? ?-Stop HCTZ. ? ?-Start diovan HCT 1 tablet daily in addition to your amlodipine. ? ?-Make sure you follow up with the hypertension clinic. ? ?-See you back in 6 weeks. ? ? ? ?Lelon Frohlich, MD ?[facility] ? ? ?

## 2021-08-31 ENCOUNTER — Telehealth: Payer: Self-pay | Admitting: *Deleted

## 2021-08-31 ENCOUNTER — Telehealth: Payer: Self-pay

## 2021-08-31 ENCOUNTER — Telehealth: Payer: Self-pay | Admitting: Licensed Clinical Social Worker

## 2021-08-31 NOTE — Chronic Care Management (AMB) (Signed)
?  Care Management  ? ?Note ? ?08/31/2021 ?Name: [name] MRN: [id number] DOB: February 01, 1981 ? ?[name] is a 41 y.o. year old male who is a primary care patient of Philip Aspen, Limmie Patricia, MD and is actively engaged with the care management team. I reached out to Kerney Elbe by phone today to assist with re-scheduling an initial visit with the Licensed Clinical Social Worker ? ?Follow up plan: ?Unsuccessful telephone outreach attempt made. A HIPAA compliant phone message was left for the patient providing contact information and requesting a return call.  ?The care management team will reach out to the patient again over the next 7 days.  ?If patient returns call to provider office, please advise to call Embedded Care Management Care Guide Misty Stanley  at [phone]. ? ?Gwenevere Ghazi  ?Care Guide, Embedded Care Coordination ?[place]  Care Management  ?Direct Dial: [phone] ? ?

## 2021-08-31 NOTE — Chronic Care Management (AMB) (Signed)
? ?   Clinical Social Work  ?Care Management  ? Phone Outreach  ? ? ?08/31/2021 ?Name: Keion Neels MRN: [id number] DOB: 03-31-1981 ? ?Vian Fluegel is a 41 y.o. year old male who is a primary care patient of Philip Aspen, Limmie Patricia, MD .  ? ?Reason for referral:  Substance misuse .   ? ?CCM LCSW reached out to patient today by phone to introduce self, assess needs and offer Care Management services and interventions.    Telephone outreach was unsuccessful. 2nd unsuccessful telephone outreach attempt.  If unable to reach patient by phone on the 3rd attempt, will discontinue outreach calls but will be available at any time to provide services.  ? ?Plan:Will route chart to Care Guide to see if patient's caregiver would like to reschedule phone appointment  ? ?Review of patient status, including review of consultants reports, relevant laboratory and other test results, and collaboration with appropriate care team members and the patient's provider was performed as part of comprehensive patient evaluation and provision of care management services.   ? ? ?Sammuel Hines, LCSW ?Licensed Clinical Social Worker [facility]  ?CCM LCSW Coverage for Jenel Lucks, LCSW ?[phone]  ? ?  ? ? ?  ?

## 2021-09-06 NOTE — Chronic Care Management (AMB) (Signed)
?  Care Management  ? ?Note ? ?09/06/2021 ?Name: Edwin [name] MRN: [id number] DOB: 1980-06-26 ? ?Edwin [name] is a 41 y.o. year old male who is a primary care patient of Philip Aspen, Limmie Patricia, MD and is actively engaged with the care management team. I reached out to Kerney Elbe by phone today to assist with re-scheduling an initial visit with the Licensed Clinical Social Worker ? ?Follow up plan: ?Unsuccessful telephone outreach attempt made. A HIPAA compliant phone message was left for the patient providing contact information and requesting a return call. The care management team will reach out to the patient again over the next 7 days.  ?If patient returns call to provider office, please advise to call Embedded Care Management Care Guide Misty Stanley  at [phone]. ? ?Gwenevere Ghazi  ?Care Guide, Embedded Care Coordination ?[place]  Care Management  ?Direct Dial: [phone] ? ?

## 2021-09-13 NOTE — Chronic Care Management (AMB) (Signed)
?  Care Management  ? ?Note ? ?09/13/2021 ?Name: Edwin [name] MRN: [id number] DOB: 06-05-1981 ? ?Edwin [name] is a 41 y.o. year old male who is a primary care patient of Philip Aspen, Limmie Patricia, MD and is actively engaged with the care management team. I reached out to Kerney Elbe by phone today to assist with re-scheduling an initial visit with the Licensed Clinical Social Worker ? ?Follow up plan: ?Telephone appointment with care management team member scheduled for:09/22/21 ? ?Gwenevere Ghazi  ?Care Guide, Embedded Care Coordination ?[place]  Care Management  ?Direct Dial: [phone] ? ?

## 2021-09-22 ENCOUNTER — Telehealth: Payer: Self-pay | Admitting: Licensed Clinical Social Worker

## 2021-09-22 ENCOUNTER — Telehealth: Payer: Self-pay

## 2021-09-22 NOTE — Telephone Encounter (Signed)
? ?   Clinical Social Work  ?Care Management  ? Phone Outreach  ? ? ?09/22/2021 ?Name: Edwin [name] MRN: [id number] DOB: 03/29/1981 ? ?[name] is a 41 y.o. year old male who is a primary care patient of Philip Aspen, Limmie Patricia, MD .  ? ?Reason for referral: Mental Health Counseling and Resources.   ? ?CCM LCSW reached out to patient's caregiver today by phone to introduce self, assess needs and offer Care Management services and interventions.    3rd unsuccessful telephone outreach was attempted today. The patient was referred to the case management team for assistance with care management and care coordination. The patient's primary care provider has been notified of our unsuccessful attempts to make or maintain contact with the patient. The care management team is pleased to engage with this patient at any time in the future should he/she be interested in assistance from the care management team.  ? ?Plan: CCM LCSW will disconnect from care team.  ? ?Review of patient status, including review of consultants reports, relevant laboratory and other test results, and collaboration with appropriate care team members and the patient's provider was performed as part of comprehensive patient evaluation and provision of care management services.   ? ?Jenel Lucks, MSW, LCSW ?[facility] ?  [facility] ?[email or web address] ?Phone [phone] ?12:10 PM ? ? ? ?  ? ? ? ? ?

## 2021-10-19 ENCOUNTER — Ambulatory Visit: Payer: Self-pay | Admitting: Licensed Clinical Social Worker

## 2021-10-23 NOTE — Chronic Care Management (AMB) (Signed)
Care Management ?Clinical Social Work Note ? ?10/23/2021 ?Name: Edwin [name] MRN: [id number] DOB: Sep 11, 1980 ? ?Edwin [name] is a 41 y.o. year old male who is a primary care patient of Edwin [name], Edwin Patricia, MD.  The Care Management team was consulted for assistance with chronic disease management and coordination needs. ? ?Engaged with patient by telephone for follow up visit in response to provider referral for social work chronic care management and care coordination services ? ?Consent to Services:  ?Mr. Edwin [name] was given information about Care Management services today including:  ?Care Management services includes personalized support from designated clinical staff supervised by his physician, including individualized plan of care and coordination with other care providers ?24/7 contact phone numbers for assistance for urgent and routine care needs. ?The patient may stop case management services at any time by phone call to the office staff. ? ?Patient agreed to services and consent obtained.  ? ?Assessment: Review of patient past medical history, allergies, medications, and health status, including review of relevant consultants reports was performed today as part of a comprehensive evaluation and provision of chronic care management and care coordination services. ? ?SDOH (Social Determinants of Health) assessments and interventions performed:   ? ?Advanced Directives Status: Not addressed in this encounter. ? ?Care Plan ? ?No Known Allergies ? ?Outpatient Encounter Medications as of 10/19/2021  ?Medication Sig  ? acetaminophen (TYLENOL) 325 MG tablet Take 2 tablets (650 mg total) by mouth every 6 (six) hours as needed.  ? amLODipine (NORVASC) 10 MG tablet Take 1 tablet (10 mg total) by mouth daily.  ? cyclobenzaprine (FLEXERIL) 5 MG tablet Take 1 tablet (5 mg total) by mouth at bedtime.  ? HYDROcodone-acetaminophen (NORCO/VICODIN) 5-325 MG tablet Take 1-2 tablets by mouth every 6 (six) hours  as needed for moderate pain.  ? ibuprofen (ADVIL,MOTRIN) 800 MG tablet Take 1 tablet (800 mg total) by mouth 3 (three) times daily. (Patient taking differently: Take 800 mg by mouth every 6 (six) hours as needed.)  ? pantoprazole (PROTONIX) 40 MG tablet Take 1 tablet (40 mg total) by mouth daily.  ? predniSONE (DELTASONE) 10 MG tablet Begin with 6 tabs on day 1, 5 tab on day 2, 4 tab on day 3, 3 tab on day 4, 2 tab on day 5, 1 tab on day 6-take with food  ? tiZANidine (ZANAFLEX) 2 MG tablet Take 1-2 tablets (2-4 mg total) by mouth every 6 (six) hours as needed for muscle spasms.  ? valsartan-hydrochlorothiazide (DIOVAN-HCT) 160-25 MG tablet Take 1 tablet by mouth daily.  ? [EXPIRED] Vitamin D, Ergocalciferol, (DRISDOL) 1.25 MG (50000 UNIT) CAPS capsule Take 1 capsule (50,000 Units total) by mouth every 7 (seven) days for 12 doses.  ? ?No facility-administered encounter medications on file as of 10/19/2021.  ? ? ?Patient Active Problem List  ? Diagnosis Date Noted  ? Hyperlipidemia 07/06/2020  ? Vitamin D deficiency 10/13/2019  ? Hypertension   ? Tobacco abuse   ? Alcohol abuse   ? ? ?Conditions to be addressed/monitored: HTN and Alcohol Use ? ?Care Plan : LCSW Plan of Care  ?Updates made by Bridgett Larsson, LCSW since 10/23/2021 12:00 AM  ?  ? ?Problem: Coping Skills (General Plan of Care)   ?  ? ?Goal: Coping Skills Enhanced   ?Start Date: 10/19/2021  ?Expected End Date: 02/08/2022  ?This Visit's Progress: On track  ?Priority: High  ?Note:   ?Current Barriers:  ?Substance Misuse: alcohol ? ?CSW Clinical Goal(s):  ?Patient  will  demonstrate a reduction in symptoms related to :stress and alcohol use  through collaboration with Clinical Social Worker, provider, and care team.  ? ?Interventions: ?Pt reports difficulty managing health conditions due to ongoing alcohol use. Pt is open to resources  ?LCSW discussed strategies to establish SMART goals ?Inter-disciplinary care team collaboration (see longitudinal plan of  care) ?Evaluation of current treatment plan related to  self management and patient's adherence to plan as established by provider ?Review resources, discussed options and provided patient information about  ?Supportive resources for alcohol use, such as, detox and AA meetings ?Solution-Focused Strategies employed:  ?Active listening / Reflection utilized  ?Emotional Support Provided ?Provided psychoeducation for mental health needs  ?Verbalization of feelings encouraged  ? ?Task & activities to accomplish goals: ?Attend scheduled appointments ?Contact PCP office with any questions or concerns ?Utilize supportive resources discussed and healthy coping skills identified ?Provide updated insurance information to PCP office to update ? ? ?  ?  ? ?Follow Up Plan: Appointment scheduled for SW follow up with client by phone on: 11/30/21 ? ?Edwin [name], MSW, LCSW ?[facility] ?[place]  [facility] ?[name] ?Phone [phone] ?4:06 PM  ?

## 2021-10-23 NOTE — Patient Instructions (Addendum)
Visit Information ? ?Thank you for taking time to visit with me today. Please don't hesitate to contact me if I can be of assistance to you before our next scheduled telephone appointment. ? ?Following are the goals we discussed today:  ?Task & activities to accomplish goals: ?Attend scheduled appointments ?Contact PCP office with any questions or concerns ?Utilize supportive resources discussed and healthy coping skills identified ?Provide updated insurance information to PCP office to update ? ?Our next appointment is by telephone on 11/30/21  ? ?Please call the care guide team at [phone] if you need to cancel or reschedule your appointment.  ? ?If you are experiencing a Mental Health or Portal or need someone to talk to, please call the Suicide and Crisis Lifeline: 988 ?call 911  ? ?Following is a copy of your full plan of care:  ?Care Plan : [place]  ?Updates made by Rebekah [place]field, LCSW since 10/23/2021 12:00 AM  ?  ? ?Problem: Coping Skills (General Plan of Care)   ?  ? ?Goal: Coping Skills Enhanced   ?Start Date: 10/19/2021  ?Expected End Date: 02/08/2022  ?This Visit's Progress: On track  ?Priority: High  ?Note:   ?Current Barriers:  ?Substance Misuse: alcohol ? ?CSW Clinical Goal(s):  ?Patient  will demonstrate a reduction in symptoms related to :stress and alcohol use  through collaboration with Clinical Social Worker, provider, and care team.  ? ?Interventions: ?Pt reports difficulty managing health conditions due to ongoing alcohol use. Pt is open to resources  ?LCSW discussed strategies to establish SMART goals ?Inter-disciplinary care team collaboration (see longitudinal plan of care) ?Evaluation of current treatment plan related to  self management and patient's adherence to plan as established by provider ?Review resources, discussed options and provided patient information about  ?Supportive resources for alcohol use, such as, detox and AA meetings ?Solution-Focused  Strategies employed:  ?Active listening / Reflection utilized  ?Emotional Support Provided ?Provided psychoeducation for mental health needs  ?Verbalization of feelings encouraged  ? ?Task & activities to accomplish goals: ?Attend scheduled appointments ?Contact PCP office with any questions or concerns ?Utilize supportive resources discussed and healthy coping skills identified ?Provide updated insurance information to PCP office to update ? ? ?  ? ? ?Edwin [name] was given information about Care Management services by the embedded care coordination team including:  ?Care Management services include personalized support from designated clinical staff supervised by his physician, including individualized plan of care and coordination with other care providers ?24/7 contact phone numbers for assistance for urgent and routine care needs. ?The patient may stop CCM services at any time (effective at the end of the month) by phone call to the office staff. ? ?Patient agreed to services and verbal consent obtained.  ? ?Patient verbalizes understanding of instructions and care plan provided today and agrees to view in [place]. Active MyChart status confirmed with patient.   ? ?Christa See, MSW, LCSW ?[facility] ?[facility] ?[email or web address] ?Phone [phone] ?4:06 PM ? ?  ?

## 2021-11-02 IMAGING — CR DG WRIST COMPLETE 3+V*L*
4 series · 4 of 4 positions shown · non-contrast
Comparison: None.

CLINICAL DATA: Pain fall

EXAM:
LEFT WRIST - COMPLETE 3+ VIEW

[x wrist pa left]
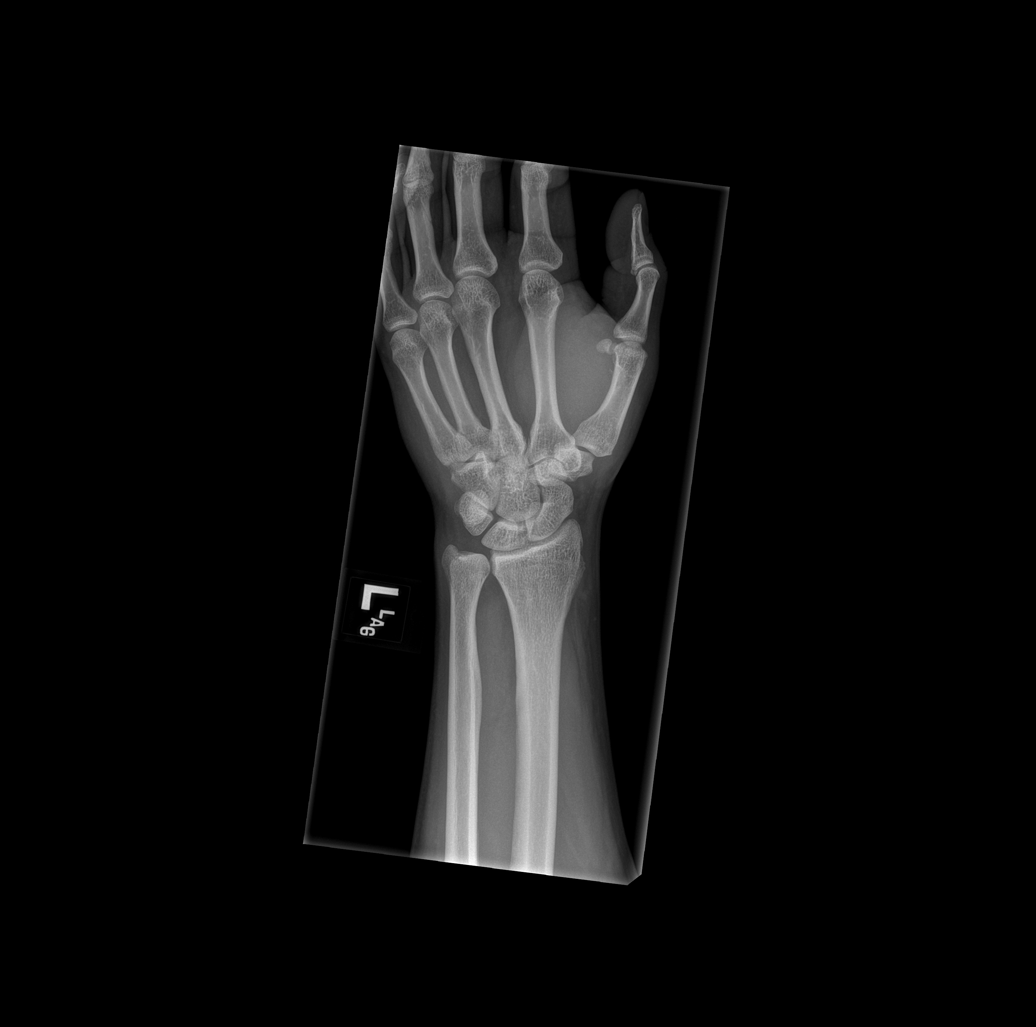

[x wrist obl left]
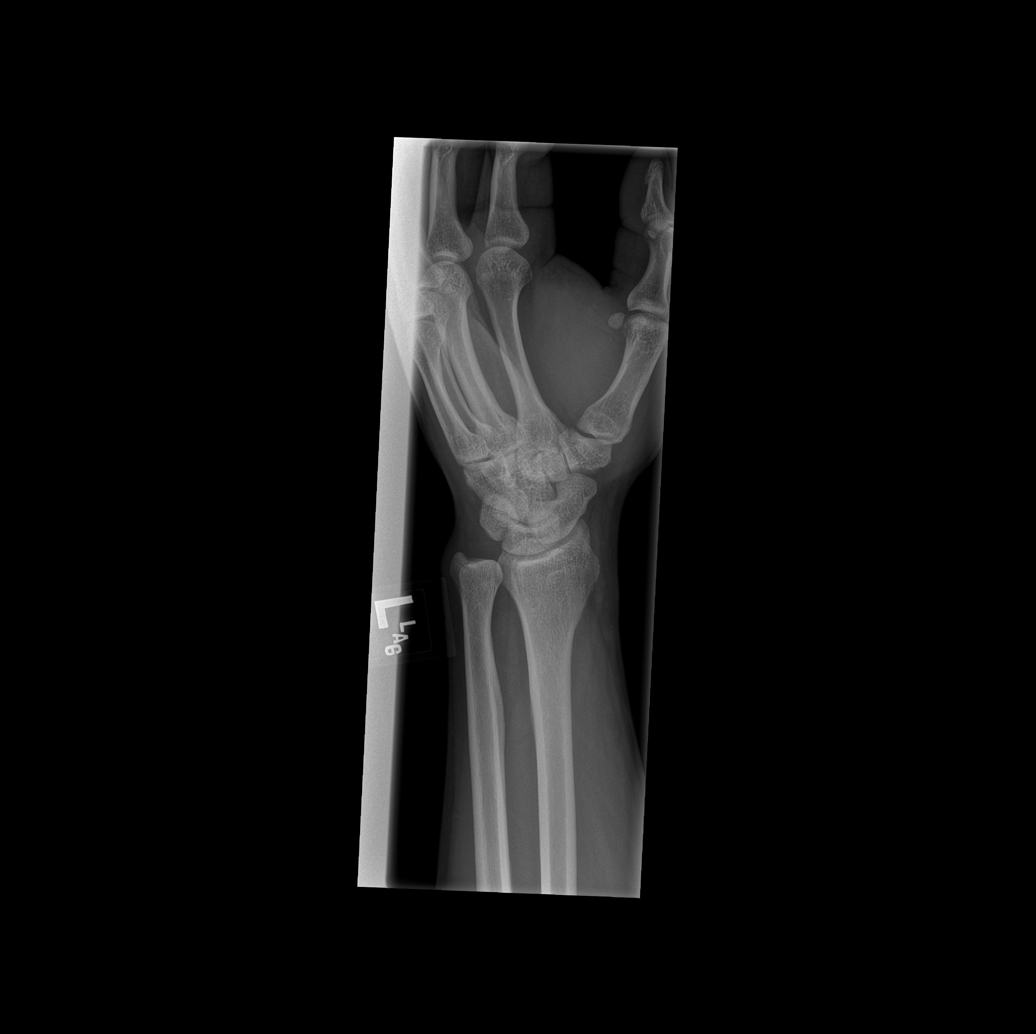

[x wrist lat left]
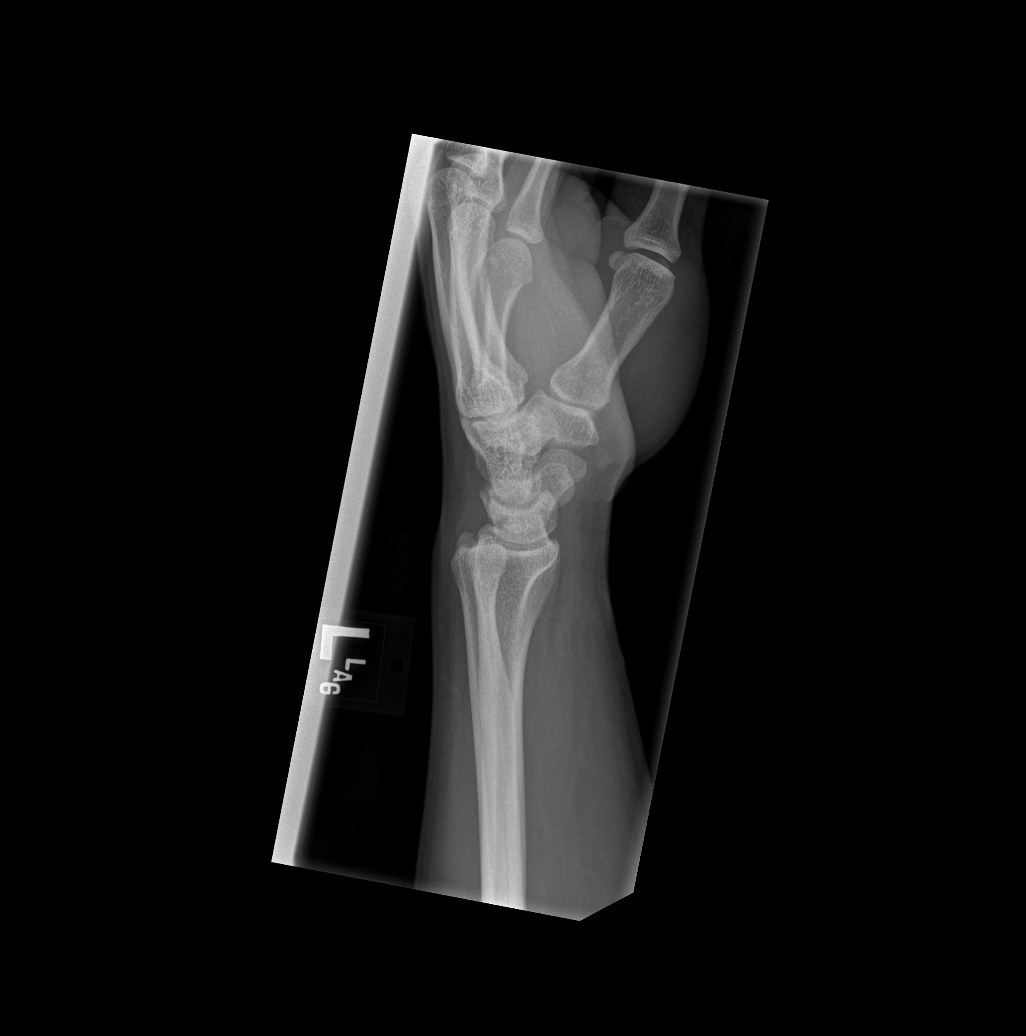

[x wrist navicular view left]
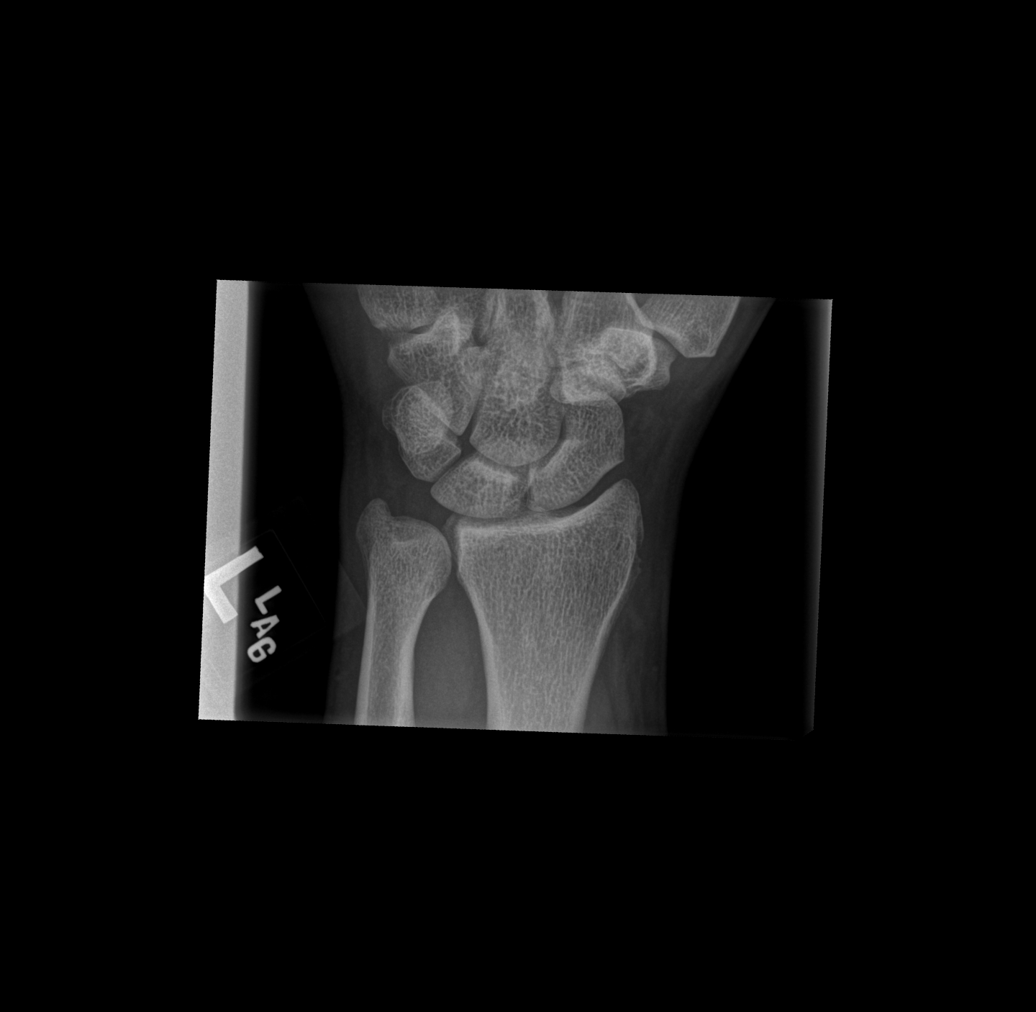

[4 of 4 positions shown; findings below may reference images not displayed]

FINDINGS: There is no evidence of fracture or dislocation. There is no
evidence of arthropathy or other focal bone abnormality. Soft
tissues are unremarkable.
IMPRESSION: Negative.

## 2021-11-30 ENCOUNTER — Ambulatory Visit: Payer: Self-pay | Admitting: Licensed Clinical Social Worker

## 2021-11-30 NOTE — Patient Instructions (Signed)
Visit Information  Thank you for taking time to visit with me today. Please don't hesitate to contact me if I can be of assistance to you before our next scheduled telephone appointment.  Following are the goals we discussed today:  Task & activities to accomplish goals: Attend scheduled appointments Contact PCP office with any questions or concerns Utilize supportive resources discussed and healthy coping skills identified Provide updated insurance information to PCP office to update  No follow up required.   If you are experiencing a Mental Health or Behavioral Health Crisis or need someone to talk to, please call the Suicide and Crisis Lifeline: 988 call 911   Patient verbalizes understanding of instructions and care plan provided today and agrees to view in MyChart. Active MyChart status and patient understanding of how to access instructions and care plan via MyChart confirmed with patient.     Jenel Lucks, MSW, LCSW [facility]  [facility] [email or web address] Phone [phone] 9:52 AM

## 2021-11-30 NOTE — Chronic Care Management (AMB) (Cosign Needed)
Care Management Clinical Social Work Note  11/30/2021 Name: Edwin [name] MRN: [id number] DOB: November 21, 1980  Edwin [name] is a 41 y.o. year old male who is a primary care patient of Philip Aspen, Limmie Patricia, MD.  The Care Management team was consulted for assistance with chronic disease management and coordination needs.  Engaged with patient by telephone for follow up visit in response to provider referral for social work chronic care management and care coordination services  Consent to Services:  Mr. Wetmore was given information about Care Management services today including:  Care Management services includes personalized support from designated clinical staff supervised by his physician, including individualized plan of care and coordination with other care providers 24/7 contact phone numbers for assistance for urgent and routine care needs. The patient may stop case management services at any time by phone call to the office staff.  Patient agreed to services and consent obtained.   Assessment: Review of patient past medical history, allergies, medications, and health status, including review of relevant consultants reports was performed today as part of a comprehensive evaluation and provision of chronic care management and care coordination services.  SDOH (Social Determinants of Health) assessments and interventions performed:    Advanced Directives Status: Not addressed in this encounter.  Care Plan  No Known Allergies  Outpatient Encounter Medications as of 11/30/2021  Medication Sig   acetaminophen (TYLENOL) 325 MG tablet Take 2 tablets (650 mg total) by mouth every 6 (six) hours as needed.   amLODipine (NORVASC) 10 MG tablet Take 1 tablet (10 mg total) by mouth daily.   cyclobenzaprine (FLEXERIL) 5 MG tablet Take 1 tablet (5 mg total) by mouth at bedtime.   HYDROcodone-acetaminophen (NORCO/VICODIN) 5-325 MG tablet Take 1-2 tablets by mouth every 6 (six) hours  as needed for moderate pain.   ibuprofen (ADVIL,MOTRIN) 800 MG tablet Take 1 tablet (800 mg total) by mouth 3 (three) times daily. (Patient taking differently: Take 800 mg by mouth every 6 (six) hours as needed.)   pantoprazole (PROTONIX) 40 MG tablet Take 1 tablet (40 mg total) by mouth daily.   predniSONE (DELTASONE) 10 MG tablet Begin with 6 tabs on day 1, 5 tab on day 2, 4 tab on day 3, 3 tab on day 4, 2 tab on day 5, 1 tab on day 6-take with food   tiZANidine (ZANAFLEX) 2 MG tablet Take 1-2 tablets (2-4 mg total) by mouth every 6 (six) hours as needed for muscle spasms.   valsartan-hydrochlorothiazide (DIOVAN-HCT) 160-25 MG tablet Take 1 tablet by mouth daily.   No facility-administered encounter medications on file as of 11/30/2021.    Patient Active Problem List   Diagnosis Date Noted   Hyperlipidemia 07/06/2020   Vitamin D deficiency 10/13/2019   Hypertension    Tobacco abuse    Alcohol abuse     Conditions to be addressed/monitored: HTN and Alcohol Use  Care Plan : LCSW Plan of Care  Updates made by Bridgett Larsson, LCSW since 11/30/2021 12:00 AM     Problem: Coping Skills (General Plan of Care)      Goal: Coping Skills Enhanced Completed 11/30/2021  Start Date: 10/19/2021  Expected End Date: 02/08/2022  This Visit's Progress: On track  Recent Progress: On track  Priority: High  Note:   Current Barriers:  Substance Misuse: alcohol  CSW Clinical Goal(s):  Patient  will demonstrate a reduction in symptoms related to :stress and alcohol use  through collaboration with Clinical Social Worker, provider, and care team.  Interventions: Pt reports difficulty managing health conditions due to ongoing alcohol use. Pt is open to resources 11/30/21: Patient reports that he has been "taking it slow" with decreasing alcohol use. Identified barriers and CCM LCSW assisted in strategies to continue progress Patient hasn't received insurance information. Pt was strongly encouraged to  contact PCP office with insurance card, upon arrival Patient denies any additional needs or support from CCM LCSW Inter-disciplinary care team collaboration (see longitudinal plan of care) Evaluation of current treatment plan related to  self management and patient's adherence to plan as established by provider Review resources, discussed options and provided patient information about  Supportive resources for alcohol use, such as, detox and AA meetings Solution-Focused Strategies employed:  Active listening / Reflection utilized  Emotional Support Provided Provided psychoeducation for mental health needs  Verbalization of feelings encouraged   Task & activities to accomplish goals: Attend scheduled appointments Contact PCP office with any questions or concerns Utilize supportive resources discussed and healthy coping skills identified Provide updated insurance information to PCP office to update        Follow Up Plan: No follow up required.   Jenel Lucks, MSW, LCSW [facility]  [facility] [email or web address] Phone [phone] 9:51 AM

## 2021-12-18 ENCOUNTER — Ambulatory Visit (HOSPITAL_BASED_OUTPATIENT_CLINIC_OR_DEPARTMENT_OTHER): Payer: Self-pay | Admitting: Cardiovascular Disease

## 2021-12-22 ENCOUNTER — Ambulatory Visit (HOSPITAL_BASED_OUTPATIENT_CLINIC_OR_DEPARTMENT_OTHER): Payer: Self-pay | Admitting: Cardiovascular Disease

## 2022-01-11 ENCOUNTER — Encounter (HOSPITAL_BASED_OUTPATIENT_CLINIC_OR_DEPARTMENT_OTHER): Payer: Self-pay | Admitting: Family

## 2022-01-11 ENCOUNTER — Telehealth: Payer: Self-pay

## 2022-01-11 ENCOUNTER — Ambulatory Visit (INDEPENDENT_AMBULATORY_CARE_PROVIDER_SITE_OTHER): Payer: Self-pay | Admitting: Family

## 2022-01-11 VITALS — BP 166/110 | HR 99 | Ht 72.0 in | Wt 196.8 lb

## 2022-01-11 DIAGNOSIS — F1090 Alcohol use, unspecified, uncomplicated: Secondary | ICD-10-CM

## 2022-01-11 DIAGNOSIS — Z720 Tobacco use: Secondary | ICD-10-CM

## 2022-01-11 DIAGNOSIS — K219 Gastro-esophageal reflux disease without esophagitis: Secondary | ICD-10-CM

## 2022-01-11 DIAGNOSIS — I10 Essential (primary) hypertension: Secondary | ICD-10-CM

## 2022-01-11 DIAGNOSIS — Z0000 Encounter for general adult medical examination without abnormal findings: Secondary | ICD-10-CM

## 2022-01-11 DIAGNOSIS — Z789 Other specified health status: Secondary | ICD-10-CM

## 2022-01-11 MED ORDER — AMLODIPINE BESYLATE 10 MG PO TABS: 10.0000 mg | ORAL_TABLET | Freq: Every day | ORAL | 1 refills | Status: DC

## 2022-01-11 NOTE — Telephone Encounter (Signed)
Called patient to inquire about his interest in smoking cessation and health maintenance per referral from Gillian Shields, NP. Patient is interested in health coaching and has been scheduled for his initial session over the phone on 8/7 at 9:00am. Patient will be called at this time. Patient provided email address to add to his profile and to be sent and email with my contact information in case he needs to reschedule.    [name], [name], Health Coach [address], [address] [place] [address] Telephone: [phone] Email: [email or web address]

## 2022-01-11 NOTE — Patient Instructions (Addendum)
Medication Instructions:   START Amlodipine 10 mg daily.  Try Prilosec for indigestion  *If you need a refill on your cardiac medications before your next appointment, please call your pharmacy*   Lab Work: Your physician recommends that you return for lab work today:TSH, CMET, Renin-Aldosterone  If you have labs (blood work) drawn today and your tests are completely normal, you will receive your results only by: MyChart Message (if you have MyChart) OR A paper copy in the mail If you have any lab test that is abnormal or we need to change your treatment, we will call you to review the results.   Testing/Procedures: Your physician has requested that you have a renal artery duplex. During this test, an ultrasound is used to evaluate blood flow to the kidneys. Allow one hour for this exam. Do not eat after midnight the day before and avoid carbonated beverages. Take your medications as you usually do.   Follow-Up: At [facility], you and your health needs are our priority.  As part of our continuing mission to provide you with exceptional heart care, we have created designated Provider Care Teams.  These Care Teams include your primary Cardiologist (physician) and Advanced Practice Providers (APPs -  Physician Assistants and Nurse Practitioners) who all work together to provide you with the care you need, when you need it.  We recommend signing up for the patient portal called "MyChart".  Sign up information is provided on this After Visit Summary.  MyChart is used to connect with patients for Virtual Visits (Telemedicine).  Patients are able to view lab/test results, encounter notes, upcoming appointments, etc.  Non-urgent messages can be sent to your provider as well.   To learn more about what you can do with MyChart, go to [email or web address].    Your next appointment:   1 month(s)  The format for your next appointment:   In Person  Provider:   Gillian Shields, NP---[facility]   Other Instructions You have been referred to PREP, Psychology, and our Care guide. They will call you to arrange appointments.   Provider Referral Exercise Program (P.R.E.P.)    12 Weeks to Wellness       What is included in the PREP?  --Health Coaching and personalized exercise prescription --Full Membership to participating [facility] for the 12 weeks --Pre- and Post-consultations to assess progress and formulate an exercise plan for       continuation of exercise   What is my investment?  Your cost is $100 (reg. $144). This includes full membership privileges to [facility] in [place] and across the country. If you complete the post-assessment visit, any applicable enrollment costs will be waived if you decide to join the [facility].   Who will benefit from PREP?  People with challenges, including (but not limited to): ---Low back pain ---Arthritis ---Hypertension ---Diabetes ---Obesity ---Joint Replacement ---Neuromuscular Disorders ---Cancer Recovery ---Weight Loss ---Many others (as determined by provider)   Get Started Today! Ask your healthcare provider to fill out a Healthcare Provider Referral for Exercise form. Be sure to turn it in before you leave the office and send/fax/email it directly to the Wellness RN to schedule your Initial Consultation. If you have family or friends that would also benefit, please share this referral with them.   Contacts:  [facility] [phone]    Viann Fish, Wellness RN  [phone]  [email or web address]   Heart-Healthy Eating Plan Eating a healthy diet is important for the health of your heart. A heart-healthy  eating plan includes: Eating less unhealthy fats. Eating more healthy fats. Eating less salt in your food. Salt is also called sodium. Making other changes in your diet. Talk with your doctor or a diet specialist (dietitian) to create an eating plan that is right for you. What is my plan? Your doctor may  recommend an eating plan that includes: Total fat: ______% or less of total calories a day. Saturated fat: ______% or less of total calories a day. Cholesterol: less than _________mg a day. Sodium: less than _________mg a day. What are tips for following this plan? Cooking Avoid frying your food. Try to bake, boil, grill, or broil it instead. You can also reduce fat by: Removing the skin from poultry. Removing all visible fats from meats. Steaming vegetables in water or broth. Meal planning  At meals, divide your plate into four equal parts: Fill one-half of your plate with vegetables and green salads. Fill one-fourth of your plate with whole grains. Fill one-fourth of your plate with lean protein foods. Eat 2-4 cups of vegetables per day. One cup of vegetables is: 1 cup (91 g) broccoli or cauliflower florets. 2 medium carrots. 1 large bell pepper. 1 large sweet potato. 1 large tomato. 1 medium white potato. 2 cups (150 g) raw leafy greens. Eat 1-2 cups of fruit per day. One cup of fruit is: 1 small apple 1 large banana 1 cup (237 g) mixed fruit, 1 large orange,  cup (82 g) dried fruit, 1 cup (240 mL) 100% fruit juice. Eat more foods that have soluble fiber. These are apples, broccoli, carrots, beans, peas, and barley. Try to get 20-30 g of fiber per day. Eat 4-5 servings of nuts, legumes, and seeds per week: 1 serving of dried beans or legumes equals  cup (90 g) cooked. 1 serving of nuts is  oz (12 almonds, 24 pistachios, or 7 walnut halves). 1 serving of seeds equals  oz (8 g). General information Eat more home-cooked food. Eat less restaurant, buffet, and fast food. Limit or avoid alcohol. Limit foods that are high in starch and sugar. Avoid fried foods. Lose weight if you are overweight. Keep track of how much salt (sodium) you eat. This is important if you have high blood pressure. Ask your doctor to tell you more about this. Try to add vegetarian meals each  week. Fats Choose healthy fats. These include olive oil and canola oil, flaxseeds, walnuts, almonds, and seeds. Eat more omega-3 fats. These include salmon, mackerel, sardines, tuna, flaxseed oil, and ground flaxseeds. Try to eat fish at least 2 times each week. Check food labels. Avoid foods with trans fats or high amounts of saturated fat. Limit saturated fats. These are often found in animal products, such as meats, butter, and cream. These are also found in plant foods, such as palm oil, palm kernel oil, and coconut oil. Avoid foods with partially hydrogenated oils in them. These have trans fats. Examples are stick margarine, some tub margarines, cookies, crackers, and other baked goods. What foods should I eat? Fruits All fresh, canned (in natural juice), or frozen fruits. Vegetables Fresh or frozen vegetables (raw, steamed, roasted, or grilled). Green salads. Grains Most grains. Choose whole wheat and whole grains most of the time. Rice and pasta, including brown rice and pastas made with whole wheat. Meats and other proteins Lean, well-trimmed beef, veal, pork, and lamb. Chicken and [place] without skin. All fish and shellfish. Wild duck, rabbit, pheasant, and venison. Egg whites or low-cholesterol egg substitutes.  Dried beans, peas, lentils, and tofu. Seeds and most nuts. Dairy Low-fat or nonfat cheeses, including ricotta and mozzarella. Skim or 1% milk that is liquid, powdered, or evaporated. Buttermilk that is made with low-fat milk. Nonfat or low-fat yogurt. Fats and oils Non-hydrogenated (trans-free) margarines. Vegetable oils, including soybean, sesame, sunflower, olive, peanut, safflower, corn, canola, and cottonseed. Salad dressings or mayonnaise made with a vegetable oil. Beverages Mineral water. Coffee and tea. Diet carbonated beverages. Sweets and desserts Sherbet, gelatin, and fruit ice. Small amounts of dark chocolate. Limit all sweets and desserts. Seasonings and  condiments All seasonings and condiments. The items listed above may not be a complete list of foods and drinks you can eat. Contact a dietitian for more options. What foods should I avoid? Fruits Canned fruit in heavy syrup. Fruit in cream or butter sauce. Fried fruit. Limit coconut. Vegetables Vegetables cooked in cheese, cream, or butter sauce. Fried vegetables. Grains Breads that are made with saturated or trans fats, oils, or whole milk. Croissants. Sweet rolls. Donuts. High-fat crackers, such as cheese crackers. Meats and other proteins Fatty meats, such as hot dogs, ribs, sausage, bacon, rib-eye roast or steak. High-fat deli meats, such as salami and bologna. Caviar. Domestic duck and goose. Organ meats, such as liver. Dairy Cream, sour cream, cream cheese, and creamed cottage cheese. Whole-milk cheeses. Whole or 2% milk that is liquid, evaporated, or condensed. Whole buttermilk. Cream sauce or high-fat cheese sauce. Yogurt that is made from whole milk. Fats and oils Meat fat, or shortening. Cocoa butter, hydrogenated oils, palm oil, coconut oil, palm kernel oil. Solid fats and shortenings, including bacon fat, salt pork, lard, and butter. Nondairy cream substitutes. Salad dressings with cheese or sour cream. Beverages Regular sodas and juice drinks with added sugar. Sweets and desserts Frosting. Pudding. Cookies. Cakes. Pies. Milk chocolate or white chocolate. Buttered syrups. Full-fat ice cream or ice cream drinks. The items listed above may not be a complete list of foods and drinks to avoid. Contact a dietitian for more information. Summary Heart-healthy meal planning includes eating less unhealthy fats, eating more healthy fats, and making other changes in your diet. Eat a balanced diet. This includes fruits and vegetables, low-fat or nonfat dairy, lean protein, nuts and legumes, whole grains, and heart-healthy oils and fats. This information is not intended to replace advice  given to you by your health care provider. Make sure you discuss any questions you have with your health care provider. Document Revised: 07/03/2021 Document Reviewed: 07/03/2021 Elsevier Patient Education  2023 [employer] Inc.      Important Information About Sugar

## 2022-01-11 NOTE — Progress Notes (Signed)
Advanced Hypertension Clinic Initial Assessment:    Date:  01/11/2022   ID:  Edwin [name], DOB September 23, 1980, MRN [id number]  PCP:  Edwin [name], Edwin Patricia, MD  Cardiologist:  None  Nephrologist:  Referring MD: Edwin [name], Estel*   CC: Hypertension  History of Present Illness:    Edwin [name] is a 41 y.o. male with a hx of hypertension, hyperlipidemia, tobacco use, alcohol use here to establish care in the Advanced Hypertension Clinic.   Family history notable for both his father and mother having hypertension.  Father progressed to ESRD on HD and mother progressed to heart failure.  Both of his parents were diagnosed with hypertension when they were young.   Was seen 07/2021 by primary care with elevated blood pressure 184/140 in the setting of noncompliance with medical therapy.  At follow-up 08/15/2021 BP 170/120 with antihypertensive regimen amlodipine 10 mg daily, hydrochlorothiazide 25 mg daily.  Hydrochlorothiazide was transitioned to valsartan-hydrochlorothiazide.  We was referred to advanced heart failure clinic.  Edwin [name] was diagnosed with hypertension in his 43s. It has been difficult to control. Blood pressure checked with arm cuff at home. Has not been checking routinely at home.  Tells me he has not taken his medication in the last week-Per pharmacy report he has not picked up his antihypertensive agents.  he reports tobacco use one pack per day starting when he was 41 years old. Alcohol use with 10 standard drinks most days.  Does note that he uses alcohol as a coping mechanism as he lost both his mom and dad around the same time.  Offered my condolences.  He is interested in making a change.  For exercise he plays with his 57 year old son. he eats at home and outside of the home and does not follow low sodium diet.   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 His chief concerns regarding antihypertensive  agents are fatigue and erectile dysfunction.  Previous antihypertensives: None (due to noncompliance)  Past Medical History:  Diagnosis Date   Alcohol abuse    Hypertension    Tobacco abuse     History reviewed. No pertinent surgical history.  Current Medications: Current Meds  Medication Sig   acetaminophen (TYLENOL) 325 MG tablet Take 2 tablets (650 mg total) by mouth every 6 (six) hours as needed.   cyclobenzaprine (FLEXERIL) 5 MG tablet Take 1 tablet (5 mg total) by mouth at bedtime.   HYDROcodone-acetaminophen (NORCO/VICODIN) 5-325 MG tablet Take 1-2 tablets by mouth every 6 (six) hours as needed for moderate pain.   ibuprofen (ADVIL,MOTRIN) 800 MG tablet Take 1 tablet (800 mg total) by mouth 3 (three) times daily. (Patient taking differently: Take 800 mg by mouth every 6 (six) hours as needed.)   pantoprazole (PROTONIX) 40 MG tablet Take 1 tablet (40 mg total) by mouth daily.   predniSONE (DELTASONE) 10 MG tablet Begin with 6 tabs on day 1, 5 tab on day 2, 4 tab on day 3, 3 tab on day 4, 2 tab on day 5, 1 tab on day 6-take with food   tiZANidine (ZANAFLEX) 2 MG tablet Take 1-2 tablets (2-4 mg total) by mouth every 6 (six) hours as needed for muscle spasms.   [DISCONTINUED] amLODipine (NORVASC) 10 MG tablet Take 1 tablet (10 mg total) by mouth daily.   [DISCONTINUED] valsartan-hydrochlorothiazide (DIOVAN-HCT) 160-25 MG tablet Take 1 tablet by mouth daily.     Allergies:   Patient  has no known allergies.   Social History   Socioeconomic History   Marital status: Single    Spouse name: Not on file   Number of children: Not on file   Years of education: Not on file   Highest education level: Not on file  Occupational History   Not on file  Tobacco Use   Smoking status: Every Day    Packs/day: 1.00    Types: Cigarettes   Smokeless tobacco: Never  Substance and Sexual Activity   Alcohol use: Yes    Comment: 2 40oz every day   Drug use: No   Sexual activity: Not on  file  Other Topics Concern   Not on file  Social History Narrative   Not on file   Social Determinants of Health   Financial Resource Strain: Not on file  Food Insecurity: Not on file  Transportation Needs: Not on file  Physical Activity: Not on file  Stress: Not on file  Social Connections: Not on file     Family History: The patient's family history includes Heart failure in his father and mother; Hypertension in his father and mother; Kidney disease in his father.  ROS:   Please see the history of present illness.     All other systems reviewed and are negative.  EKGs/Labs/Other Studies Reviewed:    EKG:  EKG is ordered today.  The ekg ordered today demonstrates NSR 99 bpm with changes consistent with LVH and QTc manually via bazett formula 462  Recent Labs: 08/01/2021: ALT 20; BUN 10; Creatinine, Ser 0.79; Hemoglobin 13.5; Platelets 243.0; Potassium 3.8; Sodium 139   Recent Lipid Panel    Component Value Date/Time   CHOL 226 (H) 08/01/2021 1008   TRIG 101.0 08/01/2021 1008   HDL 78.00 08/01/2021 1008   CHOLHDL 3 08/01/2021 1008   VLDL 20.2 08/01/2021 1008   LDLCALC 128 (H) 08/01/2021 1008   LDLCALC 115 (H) 10/09/2019 1538    Physical Exam:   VS:  BP (!) 166/110 (BP Location: Right Arm, Patient Position: Sitting, Cuff Size: Normal)   Pulse 99   Ht 6' (1.829 m)   Wt 196 lb 12.8 oz (89.3 kg)   BMI 26.69 kg/m  , BMI Body mass index is 26.69 kg/m. GENERAL:  Well appearing HEENT: Pupils equal round and reactive, fundi not visualized, oral mucosa unremarkable NECK:  No jugular venous distention, waveform within normal limits, carotid upstroke brisk and symmetric, no bruits, no thyromegaly LYMPHATICS:  No cervical adenopathy LUNGS:  Clear to auscultation bilaterally HEART:  RRR.  PMI not displaced or sustained,S1 and S2 within normal limits, no S3, no S4, no clicks, no rubs, no murmurs ABD:  Flat, positive bowel sounds normal in frequency in pitch, no bruits, no  rebound, no guarding, no midline pulsatile mass, no hepatomegaly, no splenomegaly EXT:  2 plus pulses throughout, no edema, no cyanosis no clubbing SKIN:  No rashes no nodules NEURO:  Cranial nerves II through XII grossly intact, motor grossly intact throughout PSYCH:  Cognitively intact, oriented to person place and time   ASSESSMENT/PLAN:    HTN -BP not at goal of less than 130/80.  Does endorse noncompliance with medications.  Reassurance provided that if he notices side effects of fatigue or erectile dysfunction we would make medication changes.  He is agreeable to start amlodipine 10 mg daily. Mychart message in one week to check in. Discussed to monitor BP at home at least 2 hours after medications and sitting for 5-10 minutes.  TSH, CMP, renal and aldosterone today to assess for secondary hypertension with abnormal thyroid, hyperaldosteronism. Renal artery duplex to rule out renal stenosis is contributory. No indication for sleep study as he does not report snoring. Politely declined to participate in research study. Referred to PREP exercise program at the [facility].  Tobacco use / ETOH use - 1 PPD and 10 standard drinks most days.  Cessation encouraged.  Referred to psychology as well as health coaching for coping mechanisms.  Encouraged to participate in Alcoholics Anonymous.  GERD - Notes indigestion with sensation of burning not relieved by Tums.  Recommend trial of OTC Prilosec for 2 weeks.  Anticipate heavy alcohol use also contributory.  Screening for Secondary Hypertension:     01/11/2022    1:44 PM  Causes  Drugs/Herbals Screened     - Comments 01/11/22 Etoh and tobacco use encouraged. Referred to pscyh, health coach  Renovascular HTN Screened     - Comments 01/11/22 renal duplex ordered  Sleep Apnea Screened     - Comments 01/11/22 does not snore  Thyroid Disease Screened     - Comments 01/11/22 TSH ordered  Hyperaldosteronism Screened     - Comments 01/11/22 renin-aldosterone  ordered  Coarctation of the Aorta Screened     - Comments 01/11/22 BP symmetrical  Compliance Screened     - Comments 01/11/22 noted noncompliance    Relevant Labs/Studies:    Latest Ref Rng & Units 08/01/2021   10:08 AM 05/25/2021    4:09 PM 05/25/2021   12:36 PM  Basic Labs  Sodium 135 - 145 mEq/L 139   137   Potassium 3.5 - 5.1 mEq/L 3.8  3.7  5.9   Creatinine 0.40 - 1.50 mg/dL 4.17   4.08        Latest Ref Rng & Units 07/05/2020   12:12 PM 10/09/2019    3:38 PM  Thyroid   TSH 0.35 - 4.50 uIU/mL 1.38  1.08                 01/11/2022   10:56 AM  Renovascular   Renal Artery [place] Completed Yes      Disposition:    FU with MD/PharmD/APP in 1 month    Medication Adjustments/Labs and Tests Ordered: Current medicines are reviewed at length with the patient today.  Concerns regarding medicines are outlined above.  Orders Placed This Encounter  Procedures   Comprehensive metabolic panel   TSH   Aldosterone + renin activity w/ ratio   Ambulatory referral to Psychology   Amb Referral To Provider Referral Exercise Program (P.R.E.P)   Referral to HRT/VAS Care Navigation   EKG 12-Lead   VAS US RENAL ARTERY DUPLEX   Meds ordered this encounter  Medications   amLODipine (NORVASC) 10 MG tablet    Sig: Take 1 tablet (10 mg total) by mouth daily.    Dispense:  90 tablet    Refill:  1     Signed, Alver Sorrow, NP  01/11/2022 1:48 PM    [facility]

## 2022-01-12 ENCOUNTER — Telehealth: Payer: Self-pay

## 2022-01-12 NOTE — Telephone Encounter (Signed)
Called to discuss PREP program; he works nights, would like to attend afternoon class at [facility]; will ask Pam RN [facility] to contact him about next afternoon class at [place].

## 2022-01-15 ENCOUNTER — Ambulatory Visit (INDEPENDENT_AMBULATORY_CARE_PROVIDER_SITE_OTHER): Payer: Self-pay

## 2022-01-15 DIAGNOSIS — Z0000 Encounter for general adult medical examination without abnormal findings: Secondary | ICD-10-CM

## 2022-01-15 LAB — COMPREHENSIVE METABOLIC PANEL
ALT: 85 IU/L — ABNORMAL HIGH (ref 0–44)
AST: 138 IU/L — ABNORMAL HIGH (ref 0–40)
Albumin/Globulin Ratio: 1.8 (ref 1.2–2.2)
Albumin: 4.9 g/dL (ref 4.1–5.1)
Alkaline Phosphatase: 56 IU/L (ref 44–121)
BUN/Creatinine Ratio: 12 (ref 9–20)
BUN: 7 mg/dL (ref 6–20)
Bilirubin Total: 0.8 mg/dL (ref 0.0–1.2)
CO2: 24 mmol/L (ref 20–29)
Calcium: 9.6 mg/dL (ref 8.7–10.2)
Chloride: 98 mmol/L (ref 96–106)
Creatinine, Ser: 0.6 mg/dL — ABNORMAL LOW (ref 0.76–1.27)
Globulin, Total: 2.7 g/dL (ref 1.5–4.5)
Glucose: 74 mg/dL (ref 70–99)
Potassium: 3.9 mmol/L (ref 3.5–5.2)
Sodium: 142 mmol/L (ref 134–144)
Total Protein: 7.6 g/dL (ref 6.0–8.5)
eGFR: 126 mL/min/{1.73_m2} (ref >59)

## 2022-01-15 LAB — TSH: TSH: 0.547 u[IU]/mL (ref 0.450–4.500)

## 2022-01-15 LAB — ALDOSTERONE + RENIN ACTIVITY W/ RATIO
ALDOS/RENIN RATIO: <1.7 (ref 0.0–30.0)
ALDOSTERONE: <1 ng/dL (ref 0.0–30.0)
Renin: 0.59 ng/mL/hr (ref 0.167–5.380)

## 2022-01-15 NOTE — Progress Notes (Unsigned)
Appointment Outcome: Completed, Session #: Initial Start time: 9:05am  End time: 10:09am   Total Mins: 64 minutes  AGREEMENTS SECTION   Overall Goal(s): Smoking cessation - Quit Date TBD Reduce alcohol consumption Medication compliance Improve healthy eating habits                                            Agreement/Action Steps:  Smoking Cessation Reduce alcohol consumption Exercise instead of drinking Take a walk for 30 minutes - 1 hour in the mornings Practice mini quits Extend time between smoking a cigarette from 20-30 minutes to 30-45 minutes Wait 10-15 minutes after eating before smoking Don't smoke on breaks at work Implement positive self-talk to encourage self to work through challenges with practicing mini quits and reducing alcohol consumption Share with others you are working on quitting for support Track what triggers smoking  Medication Compliance Set an alarm for a specific time to take medications in the morning (e.g., before or after morning walk at 9:30am)  Improve Healthy Eating Habits Aim for 1,500mg  of sodium per day Read food labels to track daily sodium consumption   Progress Notes:  Patient was referred to health coaching by Gillian Shields, NP for smoking cessation, medication compliance, and improving health behaviors to maintain health. Patient shared that He has been smoking since he was about 41 yo and smokes about a pack of cigarettes per day. Patient stated that he contributes some of his smoking to his routine to relax when drinking after getting home from work in the morning. Patient shared during a one hour period during his morning routine he smokes about 4 cigarettes during that time. Patient mentioned that after smoking one whole cigarette he will smoke another cigarette approximately 20-30 minutes later.   Patient reported that he also smokes after eating, takes a few puffs during one of his work breaks. Discussed with patient his triggers  for smoking and he revealed that having a routine that includes smoking, being around others who smoke, drinking, wanting to relax, and needing to calm down are a few of his triggers. Patient shared that he does not smoke around his children, but will go outside to smoke after eating. Patient stated that he used to go 8 hours at work without smoking, but now smokes during his first break with a co-worker. Patient mentioned that he does not like smoking half a cigarette and relighting it later because of the taste.   Patient was asked if he was considering using a medication, nicotine patches or other nicotine replacement therapy to aid in smoking cessation. Patient stated that he discussed this during his visit with [facility], but is not sure what would be best for him. Informed patient of how the various smoking cessation aids work and that if he chooses patches that he cannot smoke and it is not recommended to take off patch to smoke and reapply the patch because of the increase of nicotine being introduced to his body. Patient stated that he was interested in trying to cut back on his smoking without any aids because he doesn't like taking medications, and he know people who used the patch and reported that their chest was hurting but where smoking when wearing the patch.   Patient expressed that he wants to stop drinking as well and believes that cutting down on his drinking will help him reduce some  of his smoking. Patient stated that he can eliminate smoking during his work breaks as well. Suggested that since being around others that smoke is a trigger, and his co-worker smokes with him on his work break that sharing with him and others that are close to him that he is working smoking cessation so that he can be supported.   Patient shared that his reasons for wanting to cut down on his drinking and stop smoking is due to his health issues, reduce issues with family related to drinking, and being able to  save money. Discussed activities that he can do in the morning that would replace drinking and smoking that would help him relax. Patient stated that he could take a walk in the morning with his son for 30 minutes to an hour. Asked patient if he had hobbies that could keep him busy since he tends to smoke less when he is occupied. Patient shared that he likes going fishing, going to the drag strip, and working on his car on the weekends since he has more time and freedom. Patient shared that these activities still include smoking.   Discussed with patient the idea of practicing mini quits to extend the time between each cigarette he smokes. Patient stated that he could try that, but isn't sure what activities he can do in the mean time to distract him. Provided the patient a copy of the Quit4Life workbook that has worksheets to help him brain storm distractions and alternative activities he can engage in when he is triggered to smoke (e.g., after eating, when stressed, after waking up, and when around others). Patient was asked if he smoked more on the weekends. Patient shared that he had not paid attention to that and was thinking that his smoking was the same every day because of his routine, but is interested in determining the difference between days.   Patient was asked if he was able to read and understand the heart health diet recommended by [facility] to help him identify changes he can make in his diet to improve his blood pressure. Patient reported that he skimmed over the information, but haven't read it in detail. Patient was informed that monitoring his salt intake is part of the heart healthy diet. Patient stated that he thought that 2,000mg  of sodium per day was normal, but was told that 1,500mg  of sodium per day is suggested due to his blood pressure. Patient was asked if he reads food labels to determine how much sodium he consumes daily. Patient is not sure how much sodium he is consuming and  asked how much sodium is in deer sausage. Informed patient that it is challenging to determine how much sodium is used when someone cures their own meats, or used in restaurant meals. Patient stated that he doesn't eat a lot of fast food and rather cook at home and go back for seconds. Patient shared that he doesn't cook with regular salt.   Patient shared that it is challenging to remember to take his medications at times due to his work schedule varying on third shift. Patient mentioned that he is scared of taking medications and the side effects they may have. Patient takes his pain medications as needed when he is in pain. Discussed with patient strategies that could be helpful in remembering to take his blood pressure and GERD medications. Patient stated that he doesn't have a set time to take his medications and when he get home from work, majority of  the time he jump into daddy duties. Suggested to set an alarm for a time that would work for him to take his medication every day (e.g., after breakfast, before or after morning walk). Patient stated that he doesn't eat breakfast routinely and will determine a time that works for him.    Coaching Outcomes: Patient will start walking in the mornings after work to deter him from drinking and helping him reduce his smoking after work.   Patient stated that he can stop smoking on his first work break to help cut down on how much he smoke. Patient will also practice mini quits by extending the time he waits between smoking cigarettes (e.g., 20-30 minutes to 30-45 minutes). Patient will practice mini quits after eating (e.g., smoking immediately after eating to 10-15 minutes).   Patient will think about activities that he can engage in to help him prolong his mini quits. (Patient has been referred to pages 25-28 of the Quit4Life workbook to help with brainstorming activities and practicing mini quits.   Patient will track what triggers him to smoke and  determine if he smoke more during the weekend verses the week days.   Patient will share his smoking cessation goal with others for support. Patient will implement positive self-talk to work through challenges during his mini quits and reducing his alcohol consumption.   Patient will continue to cook at home to avoid eating out and cooking with no salt. Patient will start reading food labels to determine how much sodium he consumes per day to start working toward working toward reducing his sodium consumption to 1500mg  of sodium per day. Patient will review the handout provided at last visit with [facility] on the heart healthy diet to determine what additional changes he would like to improve his healthy eating habits.   Patient will determine a time to set an alarm as a reminder to take his medications.

## 2022-01-17 ENCOUNTER — Encounter (HOSPITAL_BASED_OUTPATIENT_CLINIC_OR_DEPARTMENT_OTHER): Payer: Self-pay

## 2022-01-29 ENCOUNTER — Ambulatory Visit: Payer: Self-pay

## 2022-01-29 ENCOUNTER — Telehealth: Payer: Self-pay

## 2022-01-29 DIAGNOSIS — Z0000 Encounter for general adult medical examination without abnormal findings: Secondary | ICD-10-CM

## 2022-01-29 NOTE — Telephone Encounter (Signed)
Patient called stating he has an appointment conflict and will need to reschedule.

## 2022-01-29 NOTE — Telephone Encounter (Signed)
Returned patient's call to reschedule his appointment as requested. Patient has been scheduled for a telephone health coaching session on 01/31/22 at 9:00am and will be called at this time.   Azure Budnick Nedra Hai, [facility] Guide, Health Coach [address] [place] [address] Telephone: [phone] Email: [email or web address]

## 2022-01-31 ENCOUNTER — Telehealth: Payer: Self-pay | Admitting: Licensed Clinical Social Worker

## 2022-01-31 ENCOUNTER — Ambulatory Visit (INDEPENDENT_AMBULATORY_CARE_PROVIDER_SITE_OTHER): Payer: Self-pay

## 2022-01-31 DIAGNOSIS — Z0000 Encounter for general adult medical examination without abnormal findings: Secondary | ICD-10-CM

## 2022-01-31 NOTE — Telephone Encounter (Signed)
H&V Care Navigation CSW Progress Note  Clinical Social Worker contacted patient by phone to f/u on community resources. Pt had spoken with Health Coach about ETOH cessation support. Want to also confirm pt Rosann Auerbach also still active- may be eligible for support covered by insurance.  No answer at [phone], left voicemail requesting call back. Will re-attempt as able.   Patient is participating in a Managed Medicaid Plan:  No, commercial insurance Counselling psychologist)  SDOH Screenings   Alcohol Screen: Not on file  Depression (PHQ2-9): Low Risk  (08/01/2021)   Depression (PHQ2-9)    PHQ-2 Score: 0  Financial Resource Strain: Not on file  Food Insecurity: Not on file  Housing: Not on file  Physical Activity: Not on file  Social Connections: Not on file  Stress: Not on file  Tobacco Use: High Risk (01/11/2022)   Patient History    Smoking Tobacco Use: Every Day    Smokeless Tobacco Use: Never    Passive Exposure: Not on file  Transportation Needs: Not on file   Octavio Graves, MSW, LCSW Clinical Social Worker II [facility] Health Heart/Vascular Care Navigation  [phone]- work cell phone (preferred) [phone]- desk phone

## 2022-01-31 NOTE — Progress Notes (Signed)
Appointment Outcome: Completed, Session #: 1 Start time: 9:01am   End time: 9:39am   Total Mins: 38 minutes  AGREEMENTS SECTION   Overall Goal(s): Smoking cessation - Quit Date TBD Reduce alcohol consumption Medication compliance Improve healthy eating habits                                              Agreement/Action Steps:  Smoking Cessation Reduce alcohol consumption Exercise instead of drinking Take a walk for 30 minutes - 1 hour in the mornings Practice mini quits Extend time between smoking a cigarette from 20-30 minutes to 30-45 minutes Wait 10-15 minutes after eating before smoking Don't smoke on breaks at work Implement positive self-talk to encourage self to work through challenges with practicing mini quits and reducing alcohol consumption Share with others you are working on quitting for support Track what triggers smoking   Medication Compliance Set an alarm for a specific time to take medications in the morning (e.g., before or after morning walk at 9:30am)   Improve Healthy Eating Habits Aim for 1,$RemoveB'500mg'KJqZfDvb$  of sodium per day Read food labels to track daily sodium consumption   Progress Notes:  Patient reported that he has been able to start walking in the mornings 3xs a week for an hour at a time. Patient stated that his drinking and smoking in the morning after walking is about the same even though he is walking. Patient shared that it is due to him trying to relax after work and making up for the time he is not able to smoke while at work. Patient mentioned that he was able to not smoke on his work break at times, but smoked when the urge was strong.   Patient stated that as long as he remains busy, he can keep his mind off drinking. Patient shared that there are times when he is engaged in a hobby and will drink as his way of enjoying himself and relaxing. Patient mentioned that he is working on a car at home with his son as a pastime but will have a drink.    Patient stated that the average time he refrained from smoking in between cigarettes was 1 hour. Patient mentioned that after eating that he smoked immediately. Patient shared that outside of these times he did not track his triggers for smoking. Patient did share with others that he was working on quitting smoking so they could be his support system.   Patient mentioned that he set his alarm for 4:00am as a reminder to take his medications. Patient stated that when he is at work during this time, he can take his medications on schedule, but other times are impeded by him having a drink and not liking to take medications while drinking. Patient shared that he has not implemented positive self-talk to encourage himself to practice mini-quits and to reduce his alcohol consumption.  Patient shared that he has not really paid attention to how much sodium he consumes because he is used to eating a certain way. Patient stated that he tries not to micromanage what he eats but enjoys foods that taste good. Patient stated that he still chooses fresh vegetables instead of canned goods. Patient stated that he doesn't eat out often and prefers to cook at home. Patient stated that he takes his food from home to work for lunch.   Patient  expressed that he does not like frozen meals. Patient mentioned that he has a challenge with going back for seconds. Patient shared that he is used to the country way of cooking. Patient reported that he does uses season salt, but not regular salt when cooking. Patient seasons greens and cabbages with ham hock or [place] wings and have these types of meals every Sunday.   Patient stated that he was motivated to start working on implementing his steps so that he can improve his health. Patient expressed that he worked on trying not to drink as much but could use more support. Patient stated that he is willing to try the patch to help him quit smoking and knows that he cannot smoke while  wearing the patch. Patient stated that his mother has advised him on things that he should and shouldn't eat.    Indicators of Success and Accountability: Patient stated that he worked on trying to reduce his alcohol consumption. Readiness: Patient is in the action phase of smoking cessation, medication compliance, and improving healthy eating habits. Strengths and Supports: Patient is being supported by his family. Patient stated that he is motivated and flexible to try different strategies to help him achieve his goals. Challenges and Barriers: Patient is having a challenging time breaking away from his everyday routine to help reduce his drinking and smoking.    Coaching Outcomes: Discussed with patient an alternate drink that he could enjoy instead, and patient mentioned that he likes water also. Discussed with patient alternate activities that he could engage in with is children or alone that would keep him busy so that he doesn't drink.  Patient will start counseling on Sept. 19th with Dr. Michail Sermon. Patient is interested in getting connected to resources to aid in reducing his alcohol consumption. Will provide update to LCSW to aid in available resources.  Patient is interested in trying the nicotine patch to aid in smoking cessation. Patient is aware of [employer] to contact them for patches. Patient expressed understanding that if he can't obtain the patches from this resource, then a request for a prescription will be sent to Laurann Montana, NP.   Discussed with patient alternate ways to season his food (e.g., herbs and spices or low or no sodium seasoning) instead of cured meat. Patient stated that he needs to get into a routine of reading food labels.   Patient stated that he will set aside time to read through material related to the implementation of his steps during his morning routine.   Patient will implement action steps as outlined below over the next two weeks.    Overall  Goal(s): Smoking cessation - Quit Date TBD Reduce alcohol consumption Medication compliance Improve healthy eating habits       Agreement/Action Steps:  Smoking Cessation Reduce alcohol consumption Contact resources provided for alcohol cessation Exercise instead of drinking during morning routine Take a walk at least 3xs/week for 30 minutes - 1 hour Practice mini quits Extend time between smoking a cigarette for 30 minutes - 1 hour Wait 10-15 minutes after eating before smoking Don't smoke on breaks at work Implement positive self-talk to encourage self to work through challenges with practicing mini quits and reducing alcohol consumption Utilize your support system Call [facility] [phone]) to get nicotine patches Stop smoking when starting to use patches   Medication Compliance Set an alarm for 4:00am to take medications in the morning    Improve Healthy Eating Habits Aim for 1,$RemoveB'500mg'Iwpqnzdo$  of sodium per day  Read food labels to track daily sodium consumption Practice portion control Change seasonings used with sodium to spices, herbs, or low/no sodium seasonings   Attempted: Fulfilled - Patient started walking in the mornings to aid in reducing alcohol consumption. Patient shared with others his goal for smoking cessation. Partial - Patient was able to practice mini quits at times for a period up to 1-hour but was not able to be consistent with not smoking on work breaks or waiting 10-15 minutes after eating before smoking. Patient did set an alarm for 4:00am as a reminder to take his medications but did not take them on occasions.  Not met - Patient did not implement positive self-talk to encourage himself through mini quits or reducing alcohol consumption. Patient did not track any additional triggers to smoke. Patient did not read food labels to monitor sodium consumption and aim for 1,$RemoveB'500mg'upOwbGIe$  of sodium per day.    Referrals: Referred patient to [place], [place] for resources to  help reduce alcohol consumption.

## 2022-01-31 NOTE — Progress Notes (Signed)
Heart and Vascular Care Navigation  01/31/2022  Edwin [name] 07-11-1980 [id number]  Reason for Referral: ETOH Cessation Patient is participating in a Managed Medicaid Plan: No, commercial insurance only ([employer]) Engaged with patient by telephone for initial visit for Heart and Vascular Care Coordination.                                                                                                   Assessment:                                     LCSW received a call back from pt this afternoon [phone]). I introduced self, role, and reason for call. Pt confirmed home address, PCP, he shares that he spoke with Health Coach about needing support with his ETOH consumption. Pt states that he wants to be able to quit "quickly." He acknowledges that this is a habit, and we discussed habits are often hard to break unless we find ways to form new ones or We discussed community options. 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 We also discussed that many programs have both an individual and a group element, he is scheduled to see Dr. Bosie Clos for individual counseling on 9/19 and there may be programs where he can join for group only. I encouraged pt to reach out to his insurance and see what is in network/options for him.    HRT/VAS Care Coordination     Patients Home Cardiology Office [facility]   Outpatient Care Team Social Worker   Social Worker Name: Nile Riggs, [place] [facility] [phone]   Living arrangements for the past 2 months Single Family Home   Lives with: Relatives   Patient Current Insurance Scientist, water quality  [employer]   Patient Has Concern With Paying Medical Bills No   Does Patient Have Prescription Coverage? Yes       Social History:                                                                             SDOH Screenings   Alcohol Screen: Medium Risk (01/31/2022)   Alcohol Screen     Last Alcohol Screening Score (AUDIT): 10  Depression (PHQ2-9): Low Risk  (08/01/2021)   Depression (PHQ2-9)    PHQ-2 Score: 0  Financial Resource Strain: Low Risk  (01/31/2022)   Overall Financial Resource Strain (CARDIA)    Difficulty of Paying Living Expenses: Not very hard  Food Insecurity: No Food Insecurity (01/31/2022)   Hunger Vital Sign    Worried About Running Out of Food in the Last Year: Never true    Ran Out of Food in the Last Year: Never true  Housing:  Not on file  Physical Activity: Not on file  Social Connections: Not on file  Stress: Not on file  Tobacco Use: High Risk (01/11/2022)   Patient History    Smoking Tobacco Use: Every Day    Smokeless Tobacco Use: Never    Passive Exposure: Not on file  Transportation Needs: No Transportation Needs (01/31/2022)   PRAPARE - Transportation    Lack of Transportation (Medical): No    Lack of Transportation (Non-Medical): No    SDOH Interventions: Financial Resources:  Corporate treasurer Interventions: Intervention Not Indicated  Food Insecurity:  Food Insecurity Interventions: Intervention Not Indicated  Housing Insecurity:   Housing Insecurity Interventions: Intervention Not Indicated  Transportation:   Transportation Interventions: Intervention Not Indicated    Other Care Navigation Interventions:     Inpatient/Outpatient Substance Abuse Counseling/Rehab Options Mailed list of resources, discussed cessation options   Follow-up plan:

## 2022-02-14 ENCOUNTER — Ambulatory Visit: Payer: Self-pay

## 2022-02-14 DIAGNOSIS — Z0000 Encounter for general adult medical examination without abnormal findings: Secondary | ICD-10-CM

## 2022-02-14 NOTE — Progress Notes (Signed)
Appointment Outcome: Completed, Session #: 2                        Start time: 9:00am   End time: 9:28am   Total Mins: 28 minutes  AGREEMENTS SECTION   Overall Goal(s): Smoking cessation - Quit Date TBD Reduce alcohol consumption Medication compliance Improve healthy eating habits        Agreement/Action Steps:  Smoking Cessation Reduce alcohol consumption Contact resources provided for alcohol cessation Exercise instead of drinking during morning routine Take a walk at least 3xs/week for 30 minutes - 1 hour Practice mini quits Extend time between smoking a cigarette for 30 minutes - 1 hour Wait 10-15 minutes after eating before smoking Don't smoke on breaks at work Implement positive self-talk to encourage self to work through challenges with practicing mini quits and reducing alcohol consumption Utilize your support system Call [facility] [phone]) to get nicotine patches Stop smoking when starting to use patches   Medication Compliance Set an alarm for 4:00am to take medications in the morning    Improve Healthy Eating Habits Aim for 1,511m of sodium per day Read food labels to track daily sodium consumption Practice portion control Change seasonings used with sodium to spices, herbs, or low/no sodium seasonings   Progress Notes:  Patient stated that he has walked 2-3 times each week over the past two weeks with his son for about an hour. Patient reported that he has slowed down on his drinking and smoking during the morning. Patient stated that he doesn't drink as much at other times during the day because he doesn't like to drink a lot when it's hot. Patient shared that he is smoking 2 cigarettes during his morning relaxation routine compared to 4.   Patient stated that he also finds other activities to engage in like working on his car project. Patient stated that during these times, he can go 4 hours without smoking. Patient stated that the shortest time he  goes before smoking is about 30 minutes, like after eating. Patient shared that he waits 30 minutes after eating instead of smoking immediately. Patient shared that he does not implement positive self-talk intentionally because he is ready to make changes and is focused.   Patient shared that when he has his mind made up to do something then he doesn't get deterred. Patient mentioned that he does not smoke at work and waits to smoke on his way home. Patient shared that he smokes about 4-5 cigarettes at home before work, 1 cigarette on his way to work, and 1-2 cigarettes on his way home (6 -8 cigarettes per day). Patient stated that he is getting support from his family with working on his goals and managing stress.   Patient mentioned that he has not been able to contact any of the resources provided for alcohol cessation or [employer] for the nicotine patches because he is working more and is tired when he gets home or forgets. Patient stated that he has done well with maintaining his medication schedule with the alarm set for 4:00am. Patient shared that he has got sidetracked during the time of his alarm but still takes his medication shortly after.   Patient reported that he is reading food labels to help monitor his sodium intake. Patient did not report challenges to maintain consumption of 1,5053mof sodium per day. Patient mentioned that he practices portion control a little by eyeballing his servings. Patient stated that he continues not  to use table salt to season his food. Patient mentioned that he has been trying herbs and spices as well.    Indicators of Success and Accountability:  Patient stated that his indicators of success are cutting down on his drinking and smoking, while taking his medications on time.  Readiness: Patient is in the action stage of smoking cessation, reducing alcohol consumption, improving healthy eating habits, and being compliant with taking his medications as  prescribed.  Strengths and Supports: Patient is being supported by his family. Patient has had a mindset change towards his health behaviors, has become focused and consistent with implementing his action steps.  Challenges and Barriers: Patient does not foresee any challenges/barriers to implementing his action steps over the next two weeks.    Coaching Outcomes: Patient stated that he will be able to make the phone call to get additional support with alcohol and smoking cessation.  Discussed with patient the use of seasoning salt and measuring out servings of seasonings with salt and food portions to help with managing his sodium intake more. Patient stated that his something he can try.  Patient will implement action steps as outlined above over the next two weeks excluding positive self-talk.  Attempted: Fulfilled - Patient has continued to walk 2-3 days/per week for 1 hr. instead of drinking. Patient is practicing mini quits and has extended his time to 30 mins-4hrs, while waiting 30 minutes after eating to smoke and not smoking at work on breaks. Patient is utilizing his support system. Patient continues to set his alarm for 4:00am as a reminder to take his medications. Patient is reading food labels to monitor sodium intake and maintain consumption of 1,579m of sodium per day. Partial - Patient practices portion control sometimes. Patient has begun changing out some seasonings with salt with herbs and spices.  Not met - Patient did not contact resources provided for alcohol cessation or call [employer] for nicotine patches. Patient has not had to implement positive self-talk to encourage self with challenges related to his mini quits or reducing alcohol consumption because he is focused.

## 2022-02-19 ENCOUNTER — Telehealth (HOSPITAL_BASED_OUTPATIENT_CLINIC_OR_DEPARTMENT_OTHER): Payer: Self-pay | Admitting: Family

## 2022-02-19 NOTE — Telephone Encounter (Signed)
Left message for patient to call and discuss rescheduling the renal u/s on 02/23/22 at 10:00 am.

## 2022-02-19 NOTE — Telephone Encounter (Signed)
Spoke with patient regarding new appointment date and time regarding the renal duplex  rescheduled from 02/23/22 .  Patient voiced his understanding.

## 2022-02-23 ENCOUNTER — Encounter (HOSPITAL_BASED_OUTPATIENT_CLINIC_OR_DEPARTMENT_OTHER): Payer: Self-pay

## 2022-02-27 ENCOUNTER — Ambulatory Visit: Payer: Commercial Managed Care - HMO | Admitting: Psychologist

## 2022-02-28 ENCOUNTER — Ambulatory Visit: Payer: Self-pay

## 2022-02-28 ENCOUNTER — Telehealth: Payer: Self-pay

## 2022-02-28 DIAGNOSIS — Z0000 Encounter for general adult medical examination without abnormal findings: Secondary | ICD-10-CM

## 2022-02-28 NOTE — Telephone Encounter (Signed)
Called patient to hold health coaching session over the phone. Patient did not answer. Left message on voicemail for patient to return call to hold session or to reschedule.    Ronnetta Currington [facility] Guide, Health Coach [address] [place] [address] Telephone: [phone] Email: [email or web address]

## 2022-03-08 ENCOUNTER — Telehealth: Payer: Self-pay

## 2022-03-08 ENCOUNTER — Encounter (HOSPITAL_BASED_OUTPATIENT_CLINIC_OR_DEPARTMENT_OTHER): Payer: Self-pay

## 2022-03-08 ENCOUNTER — Ambulatory Visit (HOSPITAL_BASED_OUTPATIENT_CLINIC_OR_DEPARTMENT_OTHER): Payer: Self-pay | Admitting: Family

## 2022-03-08 DIAGNOSIS — Z0000 Encounter for general adult medical examination without abnormal findings: Secondary | ICD-10-CM

## 2022-03-08 NOTE — Telephone Encounter (Signed)
Called patient to reschedule health coaching session. Patient requested an appointment next week. Patient has been scheduled for a health coaching session over the phone on March 14, 2022 at 9:00am. Patient will be called at this time.    Breyonna Nault [facility] Guide, Health Coach [address] [place] [address] Telephone: [phone] Email: [email or web address]

## 2022-03-14 ENCOUNTER — Ambulatory Visit: Payer: Commercial Managed Care - HMO

## 2022-03-15 ENCOUNTER — Ambulatory Visit: Payer: Commercial Managed Care - HMO

## 2022-03-15 ENCOUNTER — Telehealth: Payer: Self-pay

## 2022-03-15 DIAGNOSIS — Z0000 Encounter for general adult medical examination without abnormal findings: Secondary | ICD-10-CM

## 2022-03-15 NOTE — Telephone Encounter (Signed)
Called patient as scheduled for health coaching appointment. Patient requested to be rescheduled for 10/6 at 10:00am. Patient will be called at this time.    Jayland Null [facility] Guide, Health Coach [address] [place] [address] Telephone: [phone] Email: [email or web address]

## 2022-03-16 ENCOUNTER — Ambulatory Visit (HOSPITAL_BASED_OUTPATIENT_CLINIC_OR_DEPARTMENT_OTHER): Payer: Self-pay

## 2022-03-16 ENCOUNTER — Telehealth: Payer: Self-pay

## 2022-03-16 ENCOUNTER — Ambulatory Visit: Payer: Commercial Managed Care - HMO | Attending: Cardiology

## 2022-03-16 DIAGNOSIS — Z0000 Encounter for general adult medical examination without abnormal findings: Secondary | ICD-10-CM

## 2022-03-16 NOTE — Telephone Encounter (Signed)
Call to pt reference PREP class starting 03/27/22 1pm-215pm at [facility] on [address].  Can do that schedule.  Intake scheduled for 10/12 at 1130a at [facility] meet him at the desk

## 2022-03-16 NOTE — Progress Notes (Signed)
Appointment Outcome:  Completed, Session #: 3 Start time: 10:00am   End time: 10:27am   Total Mins: 27 minutes  AGREEMENTS SECTION   Overall Goal(s): Smoking cessation - Quit Date TBD Reduce alcohol consumption Medication compliance Improve healthy eating habits        Agreement/Action Steps:  Smoking Cessation Reduce alcohol consumption Contact resources provided for alcohol cessation Exercise instead of drinking during morning routine Take a walk at least 3xs/week for 30 minutes - 1 hour Practice mini quits Extend time between smoking a cigarette for 30 minutes - 1 hour Wait 10-15 minutes after eating before smoking Don't smoke on breaks at work Implement positive self-talk to encourage self to work through challenges with practicing mini quits and reducing alcohol consumption Utilize your support system Call [facility] [phone]) to get nicotine patches Stop smoking when starting to use patches   Medication Compliance Set an alarm for 4:00am to take medications in the morning    Improve Healthy Eating Habits Aim for 1,$RemoveB'500mg'NwyGNBKE$  of sodium per day Read food labels to track daily sodium consumption Practice portion control Change seasonings used with sodium to spices, herbs, or low/no sodium seasonings  Progress Notes:  Patient stated that he smokes on average about 7 cigarettes per day. Patient stated that if he stays busy then he doesn't think about smoking and if it is an activity that he cannot smoke while engaging in helps him to reduce his smoking. Patient mentioned that there are times when he does smoke 1/2 cigarette on one work break and the other half on the way home after work. Patient mentioned that he smokes about 2-3 cigarettes in the morning when he gets home and one after waking up 6-7 hours later to get ready for work.   Patient reported that his mother gave him a cartoon of cigarettes and it took him a month to smoke them. Patient stated that when the urge  to smoke increases he does not think about practicing a mini quit or talking himself through it. Patient reported that he is scared to use the nicotine patches because of other people's experiences with them having chest pains. Patient have not reached out to [employer] for that reason. Patient believes that once he tackles alcohol cessation that quitting smoking will not be far behind.   Patient expressed that his main goal is alcohol cessation. Patient stated that his family is his motivating factor in addition to doing what he can do naturally to help improve his blood pressure so that he can stop taking a lot of medications. Patient stated that 1-2 times per week he does miss taking his medications although he still has the alarm set. Patient mentioned that it is due to getting sidetrack with other activities or if he has had a drink, he doesn't like taking his medication.   Patient shared that he was consuming 6-7 drinks containing alcohol in the morning after work previously, but now consumes between 2-3 drinks in the morning. Patient stated that staying busy with walking in the mornings 1-2 days a week alone for 1 hour and on the weekends with is son for an hour is helpful in deterring him from drinking. 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 Patient shared that he really thinks that because he has been engaged in these behaviors since a teenager and being around others that drink and smoke is a habit  and are triggers. Patient stated that it's a mind thing that he is working on. Patient shared that he is making these health behavior changes because he wants his family and to improve his overall health.   Patient reported that he is not reading food labels to monitor sodium consumption, but have been eating more salads with grilled chicken, consuming fresh produce, not adding salt when cooking, and rinsing off can  goods before cooking. Patient stated that he uses garlic and onion powder to season his food. Patient stated that he has not used [employer] seasoned salt often on his food as before. Patient expressed that his is making these dietary changes because he wants to keep the weight down and to eat healthier. Patient reported that he also drinks 3 (44 oz) containers of water per day, and sometimes more because he does not drink soda. Patient shared that he tries to minimize sodium consumption by practicing portion control and not eating too much.    Indicators of Success and Accountability:  Patient has been able to cut back from consuming and average of 6 drinks containing alcohol in the morning after work to 2-3 drinks.  Readiness: Patient is in the action stage of reducing alcohol and smoking cessation, complying with medication schedule, and improving healthy eating habits.  Strengths and Supports: Patient is being supported by his family. Patient is mindful of his behaviors and is determined to make health behavior changes.  Challenges and Barriers: Patient does not like taking pills and sometimes get sidetracked for medication schedule due to responsibilities or not taking medication if he has had a drink containing alcohol.   Coaching Outcomes: Patient wants to focus more on alcohol cessation at this time and will continue to reduce his smoking over time. Discussed with patient the idea of utilizing the alcohol cessation resources or any employer resources available.   Patient does not want to use patches for smoking cessation. Patient is not interested in calling [employer] to acquire the nicotine patches. Patient is not considering using nicotine gum or lozenges.   Patient was asked what he needs to push toward his goals more. Patient did not have an answer at this time and will reflect.   Patient's action steps have been revised as outlined below that he will implement over the next two weeks.     Overall Goal(s): Alcohol cessation - Quit Date TBD Reduce cigarette smoking Medication compliance Improve healthy eating habits        Agreement/Action Steps:  Alcohol Cessation Reduce alcohol consumption over time Exercise instead of drinking during morning routine Take a walk at least 3xs/week for 30 minutes - 1 hour Reduce smoking Practice mini quits Extend time between smoking a cigarette for 30 minutes - 1 hour Wait 10-15 minutes after eating before smoking Don't smoke on breaks at work [employer] your support system   Medication Compliance Set an alarm for 4:00am to take medications in the morning    Improve Healthy Eating Habits Aim for 1,$RemoveB'500mg'COfxDzwA$  of sodium per day Don't use salt when cooking Use spices, herbs, or low/no sodium seasonings Eat fresh produce Rinse can goods before cooking Practice portion control   Attempted: Fulfilled - Patient reduced alcohol consumption, exercise instead of drinking during morning routine, patient is practicing mini quits to extend the time between smoking and utilizing his support system. Patient is practicing portion control and has reduce the use of sodium to season foods by using herbs and spices.  Partial - Patient is taking  his medication as scheduled 5-6 days per week with the alarm set as a reminder.  Not met - Patient did not attempt to contact resources provided for alcohol cessation or [facility] for patches. Patient is smoking during work breaks. Patient does not implement positive self-talk to deter smoking or reduce alcohol. Patient is not reading food labels to monitor and aim for $Remov'1500mg'MkisUv$  of sodium/day.   Not Attempted: Dropped/Revised - Patient will discontinue to read food labels as a step. Patient will focus on alcohol cessation and reducing smoking over time. Patient will not focus on contacting outside resources for alcohol cessation or smoking. Patient will not focus on implementing positive self-talk as a step to  deter smoking or drinking.

## 2022-03-21 ENCOUNTER — Ambulatory Visit: Payer: Commercial Managed Care - HMO | Admitting: Psychologist

## 2022-03-22 ENCOUNTER — Telehealth: Payer: Self-pay

## 2022-03-22 NOTE — Telephone Encounter (Signed)
pt states his legs feel heavy since monday, can walk. states it feels like they are swollen but don't appear to be.  03/22/2022 11:30:19 AM See PCP within 2 Weeks Yes Altamease Oiler, RN, Adriana  Pt has appt with PCP on 03/26/22

## 2022-03-26 ENCOUNTER — Observation Stay (HOSPITAL_COMMUNITY)
Admission: EM | Admit: 2022-03-26 | Discharge: 2022-03-27 | Disposition: A | Discharge status: Home / Self Care | Payer: Commercial Managed Care - HMO | Attending: Cardiovascular Disease | Admitting: Cardiovascular Disease

## 2022-03-26 ENCOUNTER — Emergency Department (HOSPITAL_COMMUNITY): Payer: Commercial Managed Care - HMO

## 2022-03-26 ENCOUNTER — Encounter: Payer: Self-pay | Admitting: Internal Medicine

## 2022-03-26 ENCOUNTER — Ambulatory Visit (INDEPENDENT_AMBULATORY_CARE_PROVIDER_SITE_OTHER): Payer: Commercial Managed Care - HMO | Admitting: Internal Medicine

## 2022-03-26 ENCOUNTER — Other Ambulatory Visit: Payer: Self-pay

## 2022-03-26 VITALS — BP 138/78 | HR 79 | Temp 99.1°F | Resp 18 | Ht 72.0 in | Wt 199.0 lb

## 2022-03-26 DIAGNOSIS — I4891 Unspecified atrial fibrillation: Secondary | ICD-10-CM

## 2022-03-26 DIAGNOSIS — I10 Essential (primary) hypertension: Secondary | ICD-10-CM | POA: Insufficient documentation

## 2022-03-26 DIAGNOSIS — Z79899 Other long term (current) drug therapy: Secondary | ICD-10-CM | POA: Diagnosis not present

## 2022-03-26 DIAGNOSIS — F1010 Alcohol abuse, uncomplicated: Secondary | ICD-10-CM

## 2022-03-26 DIAGNOSIS — Z720 Tobacco use: Secondary | ICD-10-CM

## 2022-03-26 DIAGNOSIS — E782 Mixed hyperlipidemia: Secondary | ICD-10-CM

## 2022-03-26 DIAGNOSIS — F17210 Nicotine dependence, cigarettes, uncomplicated: Secondary | ICD-10-CM | POA: Diagnosis not present

## 2022-03-26 DIAGNOSIS — I480 Paroxysmal atrial fibrillation: Secondary | ICD-10-CM | POA: Diagnosis not present

## 2022-03-26 DIAGNOSIS — D72829 Elevated white blood cell count, unspecified: Secondary | ICD-10-CM | POA: Diagnosis not present

## 2022-03-26 DIAGNOSIS — R42 Dizziness and giddiness: Secondary | ICD-10-CM | POA: Insufficient documentation

## 2022-03-26 DIAGNOSIS — R61 Generalized hyperhidrosis: Secondary | ICD-10-CM

## 2022-03-26 LAB — CBC
HCT: 40.6 % (ref 39.0–52.0)
Hemoglobin: 14 g/dL (ref 13.0–17.0)
MCH: 32.8 pg (ref 26.0–34.0)
MCHC: 34.5 g/dL (ref 30.0–36.0)
MCV: 95.1 fL (ref 80.0–100.0)
Platelets: 202 10*3/uL (ref 150–400)
RBC: 4.27 MIL/uL (ref 4.22–5.81)
RDW: 14.5 % (ref 11.5–15.5)
WBC: 11 10*3/uL — ABNORMAL HIGH (ref 4.0–10.5)
nRBC: 0 % (ref 0.0–0.2)

## 2022-03-26 LAB — BASIC METABOLIC PANEL
Anion gap: 13 (ref 5–15)
BUN: <5 mg/dL — ABNORMAL LOW (ref 6–20)
CO2: 26 mmol/L (ref 22–32)
Calcium: 9.8 mg/dL (ref 8.9–10.3)
Chloride: 101 mmol/L (ref 98–111)
Creatinine, Ser: 0.52 mg/dL — ABNORMAL LOW (ref 0.61–1.24)
GFR, Estimated: >60 mL/min (ref >60)
Glucose, Bld: 101 mg/dL — ABNORMAL HIGH (ref 70–99)
Potassium: 3.2 mmol/L — ABNORMAL LOW (ref 3.5–5.1)
Sodium: 140 mmol/L (ref 135–145)

## 2022-03-26 LAB — MAGNESIUM: Magnesium: 1.5 mg/dL — ABNORMAL LOW (ref 1.7–2.4)

## 2022-03-26 LAB — TROPONIN I (HIGH SENSITIVITY)
Troponin I (High Sensitivity): 11 ng/L (ref <18)
Troponin I (High Sensitivity): 12 ng/L (ref <18)

## 2022-03-26 LAB — TSH: TSH: 2.005 u[IU]/mL (ref 0.350–4.500)

## 2022-03-26 LAB — HIV ANTIBODY (ROUTINE TESTING W REFLEX): HIV Screen 4th Generation wRfx: NONREACTIVE

## 2022-03-26 LAB — BRAIN NATRIURETIC PEPTIDE: B Natriuretic Peptide: 39.4 pg/mL (ref 0.0–100.0)

## 2022-03-26 MED ORDER — ONDANSETRON HCL 4 MG/2ML IJ SOLN: 4.0000 mg | Freq: Four times a day (QID) | INTRAMUSCULAR | Status: DC | PRN

## 2022-03-26 MED ORDER — DILTIAZEM HCL-DEXTROSE 125-5 MG/125ML-% IV SOLN (PREMIX): 5.0000 mg/h | INTRAVENOUS | Status: DC

## 2022-03-26 MED ORDER — ACETAMINOPHEN 325 MG PO TABS: 650.0000 mg | ORAL_TABLET | ORAL | Status: DC | PRN

## 2022-03-26 MED ORDER — RIVAROXABAN 20 MG PO TABS
20.0000 mg | ORAL_TABLET | Freq: Every day | ORAL | Status: DC
  Administered 2022-03-26 – 2022-03-27 (×2): 20 mg via ORAL
  Filled 2022-03-26 (×2): qty 1

## 2022-03-26 MED ORDER — FOLIC ACID 1 MG PO TABS
1.0000 mg | ORAL_TABLET | Freq: Every day | ORAL | Status: DC
  Administered 2022-03-26 – 2022-03-27 (×2): 1 mg via ORAL
  Filled 2022-03-26 (×2): qty 1

## 2022-03-26 MED ORDER — DILTIAZEM HCL-DEXTROSE 125-5 MG/125ML-% IV SOLN (PREMIX)
5.0000 mg/h | INTRAVENOUS | Status: DC
  Administered 2022-03-26: 5 mg/h via INTRAVENOUS
  Administered 2022-03-26: 15 mg/h via INTRAVENOUS
  Filled 2022-03-26 (×3): qty 125

## 2022-03-26 MED ORDER — MAGNESIUM SULFATE 2 GM/50ML IV SOLN
2.0000 g | Freq: Once | INTRAVENOUS | Status: AC
  Administered 2022-03-26: 2 g via INTRAVENOUS
  Filled 2022-03-26: qty 50

## 2022-03-26 MED ORDER — POTASSIUM CHLORIDE CRYS ER 20 MEQ PO TBCR
40.0000 meq | EXTENDED_RELEASE_TABLET | Freq: Once | ORAL | Status: AC
  Administered 2022-03-26: 40 meq via ORAL
  Filled 2022-03-26: qty 2

## 2022-03-26 MED ORDER — ADULT MULTIVITAMIN W/MINERALS CH
1.0000 | ORAL_TABLET | Freq: Every day | ORAL | Status: DC
  Administered 2022-03-26 – 2022-03-27 (×2): 1 via ORAL
  Filled 2022-03-26 (×2): qty 1

## 2022-03-26 MED ORDER — LORAZEPAM 1 MG PO TABS: 1.0000 mg | ORAL_TABLET | ORAL | Status: DC | PRN

## 2022-03-26 MED ORDER — CARVEDILOL 12.5 MG PO TABS
12.5000 mg | ORAL_TABLET | Freq: Two times a day (BID) | ORAL | Status: DC
  Administered 2022-03-26 – 2022-03-27 (×3): 12.5 mg via ORAL
  Filled 2022-03-26 (×3): qty 1

## 2022-03-26 MED ORDER — PANTOPRAZOLE SODIUM 40 MG PO TBEC
40.0000 mg | DELAYED_RELEASE_TABLET | Freq: Every day | ORAL | Status: DC
  Administered 2022-03-26 – 2022-03-27 (×2): 40 mg via ORAL
  Filled 2022-03-26 (×2): qty 1

## 2022-03-26 MED ORDER — DILTIAZEM LOAD VIA INFUSION
20.0000 mg | Freq: Once | INTRAVENOUS | Status: DC
  Filled 2022-03-26: qty 20

## 2022-03-26 MED ORDER — AMLODIPINE BESYLATE 5 MG PO TABS
10.0000 mg | ORAL_TABLET | Freq: Once | ORAL | Status: AC
  Administered 2022-03-26: 10 mg via ORAL
  Filled 2022-03-26: qty 2

## 2022-03-26 MED ORDER — DILTIAZEM LOAD VIA INFUSION
20.0000 mg | Freq: Once | INTRAVENOUS | Status: AC
  Administered 2022-03-26: 20 mg via INTRAVENOUS
  Filled 2022-03-26: qty 20

## 2022-03-26 MED ORDER — THIAMINE HCL 100 MG/ML IJ SOLN
100.0000 mg | Freq: Every day | INTRAMUSCULAR | Status: DC
  Filled 2022-03-26: qty 2

## 2022-03-26 MED ORDER — LORAZEPAM 2 MG/ML IJ SOLN
1.0000 mg | INTRAMUSCULAR | Status: DC | PRN
  Administered 2022-03-26: 2 mg via INTRAVENOUS
  Filled 2022-03-26: qty 1

## 2022-03-26 MED ORDER — THIAMINE MONONITRATE 100 MG PO TABS
100.0000 mg | ORAL_TABLET | Freq: Every day | ORAL | Status: DC
  Administered 2022-03-26 – 2022-03-27 (×2): 100 mg via ORAL
  Filled 2022-03-26 (×2): qty 1

## 2022-03-26 NOTE — ED Triage Notes (Signed)
Pt BIB EMS from [facility]. Pt was being seen for pitting edema x1 week. They found him in A-fib at a rate of 150. With EMS his Hr was 100-160. 20# R AC. Denies shortness of breath.  Hx of Hypertension  Given 20mg  of Cardizem by EMS.  HR 110 200/120

## 2022-03-26 NOTE — H&P (Signed)
Cardiology Admission History and Physical   Patient ID: Edwin [name] MRN: [id number]; DOB: 1980-10-15   Admission date: 03/26/2022  PCP:  Edwin [name], Edwin Halsted, MD   [place] Providers Cardiologist:  None        Chief Complaint:  "leg tightness"  Patient Profile:   Edwin [name] is a 41 y.o. male with history of alcohol abuse, tobacco abuse, hypertension, hyperlipidemia who is being seen 03/26/2022 for the evaluation of atrial fibrillation with RVR.  History of Present Illness:   [name] presented to his PCP today for evaluation of tightness in his legs. Per PCP notes, patient was diaphoretic in clinic with EKG showing afib with RVR. His physical exam was reportedly significant for lower extremity edema and "edematous eyelids." Given physical exam findings and presumably new onset afib, patient was sent to the ED via EMS. En route, he received 20mg  of Cardizem.  Initial labs notable for magnesium of 1.5 and potassium of 3.2.  Both were replaced.  CBC with leukocytosis of 11.  Emergency department EKG with atrial fibrillation and RVR, rates in the 130s.  Given ongoing tachycardia, patient received an additional 20 mg bolus of Cardizem and was started on a Cardizem infusion.  Patient found hypertensive and received 10 mg of amlodipine as well.  In the ED, patient reports ~10 days of lower extremity edema and intermittent chest tightness with exertion at work. Denies chest pain or shortness of breath. He has not been checking his BP regularly but does endorse chronic headaches. Patient has not taken amlodipine since last Friday. Additionally he reports being told that he snores and often wakes up gasping for air at night. No history of sleep apnea evaluation.  Regarding patient's hypertension history, it appears that he has struggled to adhere to regular medical regimen. Patient says this is due to forgetting to take meds/reluctance towards chronic  medication. He was referred to the hypertension clinic by his PCP and saw Edwin [name] on 01/11/2022.  At the time of that visit patient noted that his father had ESRD and was on dialysis and his mother had suffered from heart failure.  Both were diagnosed with hypertension at a very young age.  Prior to his referral to hypertension clinic, patient was placed on amlodipine 10 mg, hydrochlorothiazide 25 mg.  Hydrochlorothiazide was then switched to valsartan/hydrochlorothiazide.  Patient reports that he struggles with both tobacco and alcohol use.  He works night shift.  Per [facility] note, patient was started on amlodipine 10 mg.  Labs were drawn including TSH, CMP, renin and aldosterone. It also appears that a renal artery duplex was ordered but has not yet been completed.  Past Medical History:  Diagnosis Date   Alcohol abuse    Hypertension    Tobacco abuse     No past surgical history on file.   Medications Prior to Admission: Prior to Admission medications   Medication Sig Start Date End Date Taking? Authorizing Provider  amLODipine (NORVASC) 10 MG tablet Take 1 tablet (10 mg total) by mouth daily. 01/11/22  Yes Edwin Dubonnet, NP  pantoprazole (PROTONIX) 40 MG tablet Take 1 tablet (40 mg total) by mouth daily. Patient taking differently: Take 40 mg by mouth as needed (stomach acid). 07/05/20  Yes Edwin Hau, MD     Allergies:   No Known Allergies  Social History:   Social History   Socioeconomic History   Marital status: Single    Spouse name: Not on file  Number of children: Not on file   Years of education: Not on file   Highest education level: Not on file  Occupational History   Not on file  Tobacco Use   Smoking status: Every Day    Packs/day: 1.00    Types: Cigarettes   Smokeless tobacco: Never  Substance and Sexual Activity   Alcohol use: Yes    Comment: 2 40oz every day   Drug use: No   Sexual activity: Not on file  Other Topics Concern    Not on file  Social History Narrative   Not on file   Social Determinants of Health   Financial Resource Strain: Low Risk  (01/31/2022)   Overall Financial Resource Strain (CARDIA)    Difficulty of Paying Living Expenses: Not very hard  Food Insecurity: No Food Insecurity (01/31/2022)   Hunger Vital Sign    Worried About Running Out of Food in the Last Year: Never true    Ran Out of Food in the Last Year: Never true  Transportation Needs: No Transportation Needs (01/31/2022)   PRAPARE - Hydrologist (Medical): No    Lack of Transportation (Non-Medical): No  Physical Activity: Not on file  Stress: Not on file  Social Connections: Not on file  Intimate Partner Violence: Not on file    Family History:   The patient's family history includes Heart failure in his father and mother; Hypertension in his father and mother; Kidney disease in his father.    ROS:  Please see the history of present illness.  All other ROS reviewed and negative.     Physical Exam/Data:   Vitals:   03/26/22 1339 03/26/22 1445 03/26/22 1530 03/26/22 1545  BP:  (!) 156/107 134/88 (!) 145/114  Pulse:   (!) 101 (!) 109  Resp:  (!) 25 18 20   Temp: 98.4 F (36.9 C)     TempSrc: Oral     SpO2:  97% 97% 98%  Weight:      Height:        Intake/Output Summary (Last 24 hours) at 03/26/2022 1619 Last data filed at 03/26/2022 1454 Gross per 24 hour  Intake 30.96 ml  Output --  Net 30.96 ml      03/26/2022    1:13 PM 03/26/2022   11:32 AM 01/11/2022    9:59 AM  Last 3 Weights  Weight (lbs) 199 lb 199 lb 196 lb 12.8 oz  Weight (kg) 90.266 kg 90.266 kg 89.268 kg     Body mass index is 27.75 kg/m.  General:  Well nourished, well developed, in no acute distress HEENT: normal Neck: no JVD Vascular: No carotid bruits; Distal pulses 2+ bilaterally   Cardiac:  normal S1, S2; irregularly irregular. No murmur Lungs:  clear to auscultation bilaterally, no wheezing, rhonchi or  rales  Abd: soft, nontender, no hepatomegaly  Ext: no edema Musculoskeletal:  No deformities, BUE and BLE strength normal and equal Skin: warm and dry  Neuro:  CNs 2-12 intact, no focal abnormalities noted Psych:  Normal affect    EKG:  The ECG that was done 10/16 was personally reviewed and demonstrates atrial fibrillation. No ischemic changes  Relevant CV Studies: N/A  Laboratory Data:  High Sensitivity Troponin:   Recent Labs  Lab 03/26/22 1344  TROPONINIHS 11      Chemistry Recent Labs  Lab 03/26/22 1344  NA 140  K 3.2*  CL 101  CO2 26  GLUCOSE 101*  BUN <5*  CREATININE 0.52*  CALCIUM 9.8  MG 1.5*  GFRNONAA >60  ANIONGAP 13    No results for input(s): "PROT", "ALBUMIN", "AST", "ALT", "ALKPHOS", "BILITOT" in the last 168 hours. Lipids No results for input(s): "CHOL", "TRIG", "HDL", "LABVLDL", "LDLCALC", "CHOLHDL" in the last 168 hours. Hematology Recent Labs  Lab 03/26/22 1344  WBC 11.0*  RBC 4.27  HGB 14.0  HCT 40.6  MCV 95.1  MCH 32.8  MCHC 34.5  RDW 14.5  PLT 202   Thyroid No results for input(s): "TSH", "FREET4" in the last 168 hours. BNP Recent Labs  Lab 03/26/22 1344  BNP 39.4    DDimer No results for input(s): "DDIMER" in the last 168 hours.   Radiology/Studies:  DG Chest Port 1 View  Result Date: 03/26/2022 CLINICAL DATA:  Atrial fibrillation, edema lower extremities EXAM: PORTABLE CHEST 1 VIEW COMPARISON:  06/03/2017 FINDINGS: The heart size and mediastinal contours are within normal limits. Both lungs are clear. The visualized skeletal structures are unremarkable. IMPRESSION: No active disease. Electronically Signed   By: Elmer Picker M.D.   On: 03/26/2022 13:27     Assessment and Plan:   New onset atrial fibrillation with RVR, secondary hypercoagulable state CHA2DS2-VASc Score = 1   Patient found to be in atrial fibrillation with RVR at his PCPs office today.  He is without symptoms of chest pain, shortness of breath,  palpitations.  TTE ordered Start [place] with Xarelto per discussion with attending Dr. Oval Linsey. Patient is at increased risk of GI bleed with history of heavy drinking. Will need to closely monitor risk vs benefit going forward. Continue home Protonix. Continue rate control with diltiazem. Will add Coreg 12.5mg  BID for additional rate/BP control, would like to titrate off Diltiazem. Goal would be to have patient on as simple of a med regimen as possible at discharge to encourage compliance. Depending on patient's compliance, could consider TEE/cardioversion after at least 3 weeks of uninterrupted [place]. Patient will need outpatient sleep study.  Hypertension  Patient with history of mixed compliance with antihypertensive regimen. Continues to smoke and drink.   Recheck TSH (normal in August 2023) Hold home Amlodipine with Diltiazem use Add Coreg 12.5mg  BID. Could consider combination PO regimen to simplify regimen at discharge.  Will obtain renal artery duplex while inpatient  Hypokalemia/hypomagnesemia  Patient with K of 3.2, Mg of 1.5 in the ED. Mg 2g IV and K 19mEq PO ordered. Likely to require additional replacement to reach goal K of 4.0, Mg 2.0, will order an additional 2g Mg bolus.  Risk Assessment/Risk Scores:         CHA2DS2-VASc Score = 1   This indicates a 0.6% annual risk of stroke. The patient's score is based upon: CHF History: 0 HTN History: 1 Diabetes History: 0 Stroke History: 0 Vascular Disease History: 0 Age Score: 0 Gender Score: 0       Severity of Illness: The appropriate patient status for this patient is OBSERVATION. Observation status is judged to be reasonable and necessary in order to provide the required intensity of service to ensure the patient's safety. The patient's presenting symptoms, physical exam findings, and initial radiographic and laboratory data in the context of their medical condition is felt to place them at decreased risk for further  clinical deterioration. Furthermore, it is anticipated that the patient will be medically stable for discharge from the hospital within 2 midnights of admission.    For questions or updates, please contact [place] Please consult [email or web address] for contact  info under     Signed, Lily Kocher, PA-C  03/26/2022 4:19 PM

## 2022-03-26 NOTE — ED Provider Notes (Signed)
[facility] EMERGENCY DEPARTMENT Provider Note   CSN: [id number] Arrival date & time: 03/26/22  1259     History  Chief Complaint  Patient presents with   Atrial Fibrillation    Edwin [name] is a 41 y.o. male history of alcohol abuse to include 1/5 of vodka a day, tobacco abuse to include 1 pack of cigarettes per day, hypertension which he is noncompliant on antihypertensive medication.  Patient presents to the ED for evaluation of A-fib with RVR.  The patient reports that for the last 1-1/2 weeks he has had "leg tightness".  The patient reports that today when he went to go to his doctor's office for this leg tightness he was noted to be in A-fib with a rapid ventricular response rate.  The patient was sent here for further management.  Patient denies any chest pain or shortness of breath since onset of leg tightness however does endorse a few instances of lightheadedness and dizziness.  The patient is noncompliant on his antihypertensive medication, he is also been referred to the heart failure clinic in the past.  The patient father had an early death from end-stage renal disease on HD.  The patient mother also suffered early death due to heart failure.  The patient denies any chest pain, shortness of breath, nausea, vomiting, abdominal pain.   Atrial Fibrillation Pertinent negatives include no chest pain, no abdominal pain and no shortness of breath.       Home Medications Prior to Admission medications   Medication Sig Start Date End Date Taking? Authorizing Provider  amLODipine (NORVASC) 10 MG tablet Take 1 tablet (10 mg total) by mouth daily. 01/11/22  Yes Loel Dubonnet, NP  pantoprazole (PROTONIX) 40 MG tablet Take 1 tablet (40 mg total) by mouth daily. Patient taking differently: Take 40 mg by mouth as needed (stomach acid). 07/05/20  Yes Isaac Bliss, Rayford Halsted, MD      Allergies    Patient has no known allergies.    Review of Systems    Review of Systems  Respiratory:  Negative for shortness of breath.   Cardiovascular:  Positive for leg swelling. Negative for chest pain.  Gastrointestinal:  Negative for abdominal pain, nausea and vomiting.  Neurological:  Positive for dizziness and light-headedness.  All other systems reviewed and are negative.   Physical Exam Updated Vital Signs BP (!) 156/107   Pulse 74   Temp 98.4 F (36.9 C) (Oral)   Resp (!) 25   Ht 5\' 11"  (1.803 m)   Wt 90.3 kg   SpO2 97%   BMI 27.75 kg/m  Physical Exam Vitals and nursing note reviewed.  Constitutional:      General: He is not in acute distress.    Appearance: He is not toxic-appearing.  HENT:     Head: Normocephalic and atraumatic.     Nose: Nose normal.     Mouth/Throat:     Mouth: Mucous membranes are moist.     Pharynx: Oropharynx is clear.  Eyes:     Extraocular Movements: Extraocular movements intact.     Conjunctiva/sclera: Conjunctivae normal.     Pupils: Pupils are equal, round, and reactive to light.  Cardiovascular:     Rate and Rhythm: Normal rate and regular rhythm.  Pulmonary:     Effort: Pulmonary effort is normal.     Breath sounds: Normal breath sounds. No wheezing.  Abdominal:     General: Abdomen is flat. Bowel sounds are normal.  Palpations: Abdomen is soft.     Tenderness: There is no abdominal tenderness.  Musculoskeletal:     Cervical back: Normal range of motion and neck supple. No tenderness.     Right lower leg: 1+ Edema present.     Left lower leg: 1+ Edema present.  Skin:    General: Skin is warm and dry.     Capillary Refill: Capillary refill takes less than 2 seconds.  Neurological:     Mental Status: He is alert and oriented to person, place, and time.     ED Results / Procedures / Treatments   Labs (all labs ordered are listed, but only abnormal results are displayed) Labs Reviewed  BASIC METABOLIC PANEL - Abnormal; Notable for the following components:      Result Value    Potassium 3.2 (*)    Glucose, Bld 101 (*)    BUN <5 (*)    Creatinine, Ser 0.52 (*)    All other components within normal limits  CBC - Abnormal; Notable for the following components:   WBC 11.0 (*)    All other components within normal limits  MAGNESIUM - Abnormal; Notable for the following components:   Magnesium 1.5 (*)    All other components within normal limits  BRAIN NATRIURETIC PEPTIDE  TSH  TROPONIN I (HIGH SENSITIVITY)  TROPONIN I (HIGH SENSITIVITY)    EKG EKG Interpretation  Date/Time:  Monday March 26 2022 13:12:39 EDT Ventricular Rate:  136 PR Interval:    QRS Duration: 93 QT Interval:  320 QTC Calculation: 482 R Axis:   27 Text Interpretation: Atrial fibrillation Borderline repolarization abnormality Borderline prolonged QT interval Now in atrial fibrillation with rapid ventricular response compared to prior EKG Confirmed by Leanord Asal ([id number]) on 03/26/2022 1:38:21 PM  Radiology DG Chest Port 1 View  Result Date: 03/26/2022 CLINICAL DATA:  Atrial fibrillation, edema lower extremities EXAM: PORTABLE CHEST 1 VIEW COMPARISON:  06/03/2017 FINDINGS: The heart size and mediastinal contours are within normal limits. Both lungs are clear. The visualized skeletal structures are unremarkable. IMPRESSION: No active disease. Electronically Signed   By: Elmer Picker M.D.   On: 03/26/2022 13:27    Procedures Procedures   Medications Ordered in ED Medications  diltiazem (CARDIZEM) 1 mg/mL load via infusion 20 mg (20 mg Intravenous Bolus from Bag 03/26/22 1341)    And  diltiazem (CARDIZEM) 125 mg in dextrose 5% 125 mL (1 mg/mL) infusion (15 mg/hr Intravenous Rate/Dose Change 03/26/22 1453)  magnesium sulfate IVPB 2 g 50 mL (has no administration in time range)  potassium chloride SA (KLOR-CON M) CR tablet 40 mEq (has no administration in time range)  amLODipine (NORVASC) tablet 10 mg (10 mg Oral Given 03/26/22 1408)    ED Course/ Medical Decision Making/  A&P                           Medical Decision Making Amount and/or Complexity of Data Reviewed Labs: ordered. Radiology: ordered.  Risk Prescription drug management.   41 year old male presents to the ED for evaluation.  Please see HPI for further details.  My examination the patient is afebrile and tachycardic with A-fib on the monitor with a pulse rate that ranges between 110 and 150.  The patient sounds are clear bilaterally, he is not hypoxic.  Patient abdomen soft and compressible throughout.  Patient neurological examination shows no focal neurodeficits.  Patient does have 1+ nonpitting edema to bilateral lower extremities.  Patient will be assessed utilizing the following labs and imaging studies interpreted by me personally: Plain film imaging of chest, BNP, troponin, mag, BMP, CBC, EKG.  Patient BNP unremarkable at 39.4.  Patient troponin initially 11, delta troponin pending.  Patient magnesium was 1.5 repleted with 2 g of mag sulfate.  The patient BMP was decreased potassium to 3.2 which was repleted utilizing 40 mEq of oral potassium.  The patient CBC showed a leukocytosis of 11.  The patient chest x-ray showed no active disease or pleural effusions.  The patient EKG was nonischemic however did show patient being in A-fib with RVR.  Patient was given 20 mg of Cardizem with EMS.  Patient continued to be tachycardic to the 130s in the department so additional 20 mg of Cardizem was administered along with Cardizem infusion.  Patient was also provided with 10 mg amlodipine for blood pressure.  At this time, cardiology was consulted for further management.  Angie, cardiology, has stated that this patient will be admitted to their service for further management and work-up.  The patient is amenable to this plan.  The patient is stable at the time of admission.  Final Clinical Impression(s) / ED Diagnoses Final diagnoses:  Atrial fibrillation with RVR [facility])    Rx / DC Orders ED  Discharge Orders     None         An[place], [place], PA-C 03/26/22 [place], [place], DO 03/26/22 1604

## 2022-03-26 NOTE — Progress Notes (Signed)
Established Patient Office Visit     CC/Reason for Visit: "Tightness in legs"  HPI: Edwin [name] is a 41 y.o. male who is coming in today for the above mentioned reasons. Past Medical History is significant for: Hypertension, hyperlipidemia as well as ongoing tobacco and alcohol abuse.  I last saw him in March for hypertension.  He is known to have a history of noncompliance.  He was seen at the hypertension clinic, however never followed up with recommended testing.  He denies chest pain or shortness of breath, he does have significant lower extremity edema and is visibly diaphoretic despite temperatures in the low 50s today.  Admits to being noncompliant with amlodipine.   Past Medical/Surgical History: Past Medical History:  Diagnosis Date   Alcohol abuse    Hypertension    Tobacco abuse     No past surgical history on file.  Social History:  reports that he has been smoking cigarettes. He has been smoking an average of 1 pack per day. He has never used smokeless tobacco. He reports current alcohol use. He reports that he does not use drugs.  Allergies: No Known Allergies  Family History:  Family History  Problem Relation Age of Onset   Heart failure Mother    Hypertension Mother    Kidney disease Father    Heart failure Father    Hypertension Father      Current Outpatient Medications:    acetaminophen (TYLENOL) 325 MG tablet, Take 2 tablets (650 mg total) by mouth every 6 (six) hours as needed., Disp: 30 tablet, Rfl: 0   amLODipine (NORVASC) 10 MG tablet, Take 1 tablet (10 mg total) by mouth daily., Disp: 90 tablet, Rfl: 1   cyclobenzaprine (FLEXERIL) 5 MG tablet, Take 1 tablet (5 mg total) by mouth at bedtime., Disp: 15 tablet, Rfl: 0   HYDROcodone-acetaminophen (NORCO/VICODIN) 5-325 MG tablet, Take 1-2 tablets by mouth every 6 (six) hours as needed for moderate pain., Disp: 30 tablet, Rfl: 0   ibuprofen (ADVIL,MOTRIN) 800 MG tablet, Take 1 tablet (800  mg total) by mouth 3 (three) times daily. (Patient taking differently: Take 800 mg by mouth every 6 (six) hours as needed.), Disp: 21 tablet, Rfl: 0   pantoprazole (PROTONIX) 40 MG tablet, Take 1 tablet (40 mg total) by mouth daily., Disp: 90 tablet, Rfl: 1   predniSONE (DELTASONE) 10 MG tablet, Begin with 6 tabs on day 1, 5 tab on day 2, 4 tab on day 3, 3 tab on day 4, 2 tab on day 5, 1 tab on day 6-take with food, Disp: 21 tablet, Rfl: 0   tiZANidine (ZANAFLEX) 2 MG tablet, Take 1-2 tablets (2-4 mg total) by mouth every 6 (six) hours as needed for muscle spasms., Disp: 30 tablet, Rfl: 0  Review of Systems:  Constitutional: Denies fever, chills,  appetite change.  HEENT: Denies photophobia, eye pain, redness, hearing loss, ear pain, congestion, sore throat, rhinorrhea, sneezing, mouth sores, trouble swallowing, neck pain, neck stiffness and tinnitus.   Respiratory: Denies SOB, DOE, cough, chest tightness,  and wheezing.   Cardiovascular: Denies chest pain and leg swelling.  Gastrointestinal: Denies nausea, vomiting, abdominal pain, diarrhea, constipation, blood in stool and abdominal distention.  Genitourinary: Denies dysuria, urgency, frequency, hematuria, flank pain and difficulty urinating.  Endocrine: Denies: hot or cold intolerance,  changes in hair or nails, polyuria, polydipsia. Musculoskeletal: Denies myalgias, back pain, joint swelling, arthralgias and gait problem.  Skin: Denies pallor, rash and wound.  Neurological: Denies  dizziness, seizures, syncope,  numbness and headaches.  Hematological: Denies adenopathy. Easy bruising, personal or family bleeding history  Psychiatric/Behavioral: Denies suicidal ideation, mood changes, confusion, nervousness, sleep disturbance and agitation    Physical Exam: Vitals:   03/26/22 1132  BP: 138/78  Pulse: 79  Resp: 18  Temp: 99.1 F (37.3 C)  TempSrc: Oral  SpO2: 98%  Weight: 199 lb (90.3 kg)  Height: 6' (1.829 m)    Body mass index  is 26.99 kg/m.   Constitutional: NAD, calm, appears shaky, visibly diaphoretic, constantly wiping at his forehead with tissue. Eyes: PERRL, lids appear edematous ENMT: Mucous membranes are moist.  Respiratory: clear to auscultation bilaterally, no wheezing, no crackles. Normal respiratory effort. No accessory muscle use.  Cardiovascular: Very tachycardic with an irregular rhythm, I am unable to auscultate any murmurs.  2-3+ pitting bilateral lower extremity edema.  Psychiatric: Normal judgment and insight. Alert and oriented x 3. Normal mood.    Impression and Plan:  Atrial fibrillation with RVR ([place]) - Plan: EKG 12-Lead  Primary hypertension  Mixed hyperlipidemia  Tobacco abuse  Alcohol abuse  Diaphoresis  -His EKG today shows atrial fibrillation with rapid ventricular response at a rate of 150. -He is visibly diaphoretic, he denies chest pain or shortness of breath although he does have significant lower extremity edema. -I believe he requires urgent ED evaluation today. -As usual he has been nonadherent to medical therapy. -He continues to smoke about 1/2 pack a day and drinks about 6 to 8 ounces of vodka daily.  Time spent:32 minutes reviewing chart, interviewing and examining patient and formulating plan of care.       Lelon Frohlich, MD [facility]

## 2022-03-26 NOTE — ED Notes (Signed)
Patient resting at this time, alert and calm, no distress or complaints.  

## 2022-03-27 ENCOUNTER — Other Ambulatory Visit (HOSPITAL_COMMUNITY): Payer: Self-pay

## 2022-03-27 ENCOUNTER — Observation Stay (HOSPITAL_BASED_OUTPATIENT_CLINIC_OR_DEPARTMENT_OTHER): Payer: Commercial Managed Care - HMO

## 2022-03-27 ENCOUNTER — Telehealth (HOSPITAL_COMMUNITY): Payer: Self-pay | Admitting: Pharmacy Technician

## 2022-03-27 DIAGNOSIS — I160 Hypertensive urgency: Secondary | ICD-10-CM

## 2022-03-27 DIAGNOSIS — I4891 Unspecified atrial fibrillation: Secondary | ICD-10-CM

## 2022-03-27 DIAGNOSIS — I10 Essential (primary) hypertension: Secondary | ICD-10-CM | POA: Diagnosis not present

## 2022-03-27 DIAGNOSIS — F1010 Alcohol abuse, uncomplicated: Secondary | ICD-10-CM | POA: Diagnosis not present

## 2022-03-27 DIAGNOSIS — I480 Paroxysmal atrial fibrillation: Secondary | ICD-10-CM | POA: Diagnosis not present

## 2022-03-27 LAB — CBC
HCT: 40.1 % (ref 39.0–52.0)
Hemoglobin: 13.7 g/dL (ref 13.0–17.0)
MCH: 32.8 pg (ref 26.0–34.0)
MCHC: 34.2 g/dL (ref 30.0–36.0)
MCV: 95.9 fL (ref 80.0–100.0)
Platelets: 192 10*3/uL (ref 150–400)
RBC: 4.18 MIL/uL — ABNORMAL LOW (ref 4.22–5.81)
RDW: 14.3 % (ref 11.5–15.5)
WBC: 8.3 10*3/uL (ref 4.0–10.5)
nRBC: 0 % (ref 0.0–0.2)

## 2022-03-27 LAB — BASIC METABOLIC PANEL
Anion gap: 10 (ref 5–15)
BUN: 7 mg/dL (ref 6–20)
CO2: 26 mmol/L (ref 22–32)
Calcium: 9.7 mg/dL (ref 8.9–10.3)
Chloride: 100 mmol/L (ref 98–111)
Creatinine, Ser: 0.66 mg/dL (ref 0.61–1.24)
GFR, Estimated: >60 mL/min (ref >60)
Glucose, Bld: 104 mg/dL — ABNORMAL HIGH (ref 70–99)
Potassium: 3.8 mmol/L (ref 3.5–5.1)
Sodium: 136 mmol/L (ref 135–145)

## 2022-03-27 LAB — ECHOCARDIOGRAM COMPLETE
Area-P 1/2: 5.16 cm2
Height: 71 in
S' Lateral: 4 cm
Weight: 3184.01 oz

## 2022-03-27 MED ORDER — DILTIAZEM HCL ER COATED BEADS 300 MG PO CP24
300.0000 mg | ORAL_CAPSULE | Freq: Every day | ORAL | Status: DC
  Administered 2022-03-27: 300 mg via ORAL
  Filled 2022-03-27: qty 1

## 2022-03-27 MED ORDER — RIVAROXABAN 20 MG PO TABS: 20.0000 mg | ORAL_TABLET | Freq: Every day | ORAL | 11 refills | Status: DC

## 2022-03-27 MED ORDER — CARVEDILOL 12.5 MG PO TABS: 12.5000 mg | ORAL_TABLET | Freq: Two times a day (BID) | ORAL | 3 refills | Status: DC

## 2022-03-27 MED ORDER — DILTIAZEM HCL ER COATED BEADS 300 MG PO CP24: 300.0000 mg | ORAL_CAPSULE | Freq: Every day | ORAL | 3 refills | Status: DC

## 2022-03-27 NOTE — Telephone Encounter (Signed)
Pharmacy Patient Advocate Encounter  Insurance verification completed.    The patient is insured through [employer]   The patient is currently admitted and ran test claims for the following: Eliquis, Xarelto.  Copays and coinsurance results were relayed to Inpatient clinical team.      

## 2022-03-27 NOTE — Telephone Encounter (Signed)
Patient Advocate Encounter  Prior Authorization for Xarelto 20MG  tablets has been approved.    PA# [id number] Key: [id number] Effective dates: 03/27/2022 through 03/27/2023  Patients co-pay is $15.00.     Lyndel Safe, [place] Patient Advocate Specialist [place] Patient Advocate Team Direct Number: [phone]  Fax: [phone]

## 2022-03-27 NOTE — ED Notes (Signed)
Patient verbalizes understanding of discharge instructions. Opportunity for questioning and answers were provided. Armband removed by staff, pt discharged from ED.  

## 2022-03-27 NOTE — ED Notes (Signed)
Patient transported to [facility] 

## 2022-03-27 NOTE — TOC Benefit Eligibility Note (Signed)
Patient Teacher, English as a foreign language completed.    The patient is currently admitted and upon discharge could be taking Eliquis 5 mg.  Requires Prior Authorization Required  The patient is currently admitted and upon discharge could be taking Xarelto 20 mg.  Requires Prior Authorization Required  The patient is insured through [place], [place] Patient Advocate Specialist [place] Patient Advocate Team Direct Number: [phone]  Fax: [phone]

## 2022-03-27 NOTE — Progress Notes (Signed)
Renal artery duplex completed. Refer to "CV Proc" under chart review to view preliminary results.  03/27/2022 9:17 AM Kelby Aline., MHA, RVT, RDCS, RDMS

## 2022-03-27 NOTE — Progress Notes (Signed)
CSW attempted to meet with patient at bedside however patient was undergoing a procedure at the time.  Madilyn Fireman, MSW, LCSW Transitions of Care  Clinical Social Worker II [phone]

## 2022-03-27 NOTE — Discharge Summary (Signed)
Discharge Summary    Patient ID: Edwin [name] MRN: [id number]; DOB: 06-28-1980  Admit date: 03/26/2022 Discharge date: 03/27/2022  PCP:  Isaac Bliss, Rayford Halsted, MD   [place] [facility] Cardiologist:  Skeet Latch, MD       Discharge Diagnoses    Principal Problem:   Atrial fibrillation [facility]) Active Problems:   Essential hypertension   ETOH abuse    Diagnostic Studies/Procedures    Echocardiogram 03/27/22  1. Left ventricular ejection fraction, by estimation, is 50 to 55%. Left  ventricular ejection fraction by PLAX is 54 %. The left ventricle has low  normal function. The left ventricle has no regional wall motion  abnormalities. Left ventricular diastolic  function could not be evaluated.   2. Right ventricular systolic function is mildly reduced. The right  ventricular size is normal. Tricuspid regurgitation signal is inadequate  for assessing PA pressure.   3. The mitral valve is abnormal. Trivial mitral valve regurgitation.   4. The aortic valve is tricuspid. Aortic valve regurgitation is not  visualized.   5. The inferior vena cava is normal in size with <50% respiratory  variability, suggesting right atrial pressure of 8 mmHg.   Comparison(s): No prior Echocardiogram.  _____________   History of Present Illness     Edwin [name] is a 41 y.o. male with a history of alcohol abuse, tobacco abuse, hypertension, hyperlipidemia who was seen 03/26/2022 for the evaluation of atrial fibrillation with RVR.  Edwin [name] presented to his PCP on 10/16 for evaluation of tightness in his legs. Per PCP notes, patient was diaphoretic in clinic with EKG showing afib with RVR. His physical exam was reportedly significant for lower extremity edema and "edematous eyelids." Given physical exam findings and presumably new onset afib, patient was sent to the ED via EMS. En route, he received 20mg  of Cardizem.  Initial labs notable for magnesium of 1.5  and potassium of 3.2.  Both were replaced.  CBC with leukocytosis of 11.  Emergency department EKG with atrial fibrillation and RVR, rates in the 130s.  Given ongoing tachycardia, patient received an additional 20 mg bolus of Cardizem and was started on a Cardizem infusion.     In the ED, patient reported ~10 days of lower extremity edema and intermittent chest tightness with exertion while at work. Denied chest pain or shortness of breath. He had not been checking his BP regularly but did endorse chronic headaches. Patient had not been taking amlodipine at home. Additionally he reported being told that he snores and often woke up gasping for air at night. No history of sleep apnea evaluation.   Regarding patient's hypertension history, it appears that he has struggled to adhere to regular medical regimen. Patient reported that this is due to forgetting to take meds/reluctance towards chronic medication. He was referred to the hypertension clinic by his PCP and saw Laurann Montana on 01/11/2022.  At the time of that visit, patient noted that his father had ESRD and was on dialysis and his mother had suffered from heart failure.  Both were diagnosed with hypertension at a very young age.  Prior to his referral to hypertension clinic, patient was placed on amlodipine 10 mg, hydrochlorothiazide 25 mg.  Hydrochlorothiazide was then switched to valsartan/hydrochlorothiazide.  Patient reported that he struggles with both tobacco and alcohol use.  He works night shift.  Per [facility] note, patient was started on amlodipine 10 mg.  Labs were drawn including TSH, CMP, renin and aldosterone. It also  appears that a renal artery duplex was ordered but has not yet been completed.  Hospital Course     Consultants: None    PAF:  - New onset.  Was found to be in afib with RVR at PCP, was taken to ED for evaluation. He has a history of heavy EtOH abuse, which is the likely trigger.  - He was started on IV diltiazem  and carvedilol in the ED. Heart rates improved    - We have transitioned him to PO diltiazem 300 mg daily. After stopping IV diltiazem, HR continued to be controlled in the 70s-80s - Continue carvedilol 12.5 mg BID  - Echo this admission showed EF 50-55%, no regional wall motion abnormalities, mildly reduced RV systolic function - Thyroid function is normal.   - He has been started on Xarelto with plans to follow-up in clinic in 3 weeks. If he remains in atrial fibrillation, we will plan DCCV at that time.   - Given his alcohol history and CHA2DS2Vasc of 1, plan for 1 month of anticoagulation post cardioversion.  Discussed the importance of monitoring for signs of bleeding when starting blood thinner. Also discussed the importance of seeking urgent medical assistance if he has any trauma to head  - Emphasized the importance of abstaining from alcohol - Outpatient sleep study. - Patient has an outpatient follow up appointment on 04/19/22   Hypertension:  - Continue cardizem 300 mg daily  - Continue carvedilol 12.5 mg BID  - He was previously on HCTZ and losartan.  These were discontinued for somewhat unclear reasons.  He thinks he had intolerance to one but is not sure which. - Bilateral renal artery ultrasounds on 10/17 showed no evidence of renal artery stenosis   Prior to DC-- HR was in the 80s, BP 118/71.   Patient has a follow up appointment on 04/19/22.  Patient was seen and examined by Dr. Oval Linsey and deemed stable for discharge.   Did the patient have an acute coronary syndrome (MI, NSTEMI, STEMI, etc) this admission?:  No                               Did the patient have a percutaneous coronary intervention (stent / angioplasty)?:  No.      _____________  Discharge Vitals Blood pressure 118/71, pulse 74, temperature 98.1 F (36.7 C), temperature source Oral, resp. rate 17, height 5\' 11"  (1.803 m), weight 90.3 kg, SpO2 100 %.  Filed Weights   03/26/22 1313  Weight: 90.3 kg     Labs & Radiologic Studies    CBC Recent Labs    03/26/22 1344 03/27/22 0204  WBC 11.0* 8.3  HGB 14.0 13.7  HCT 40.6 40.1  MCV 95.1 95.9  PLT 202 [id number]   Basic Metabolic Panel Recent Labs    03/26/22 1344 03/27/22 0204  NA 140 136  K 3.2* 3.8  CL 101 100  CO2 26 26  GLUCOSE 101* 104*  BUN <5* 7  CREATININE 0.52* 0.66  CALCIUM 9.8 9.7  MG 1.5*  --    Liver Function Tests No results for input(s): "AST", "ALT", "ALKPHOS", "BILITOT", "PROT", "ALBUMIN" in the last 72 hours. No results for input(s): "LIPASE", "AMYLASE" in the last 72 hours. High Sensitivity Troponin:   Recent Labs  Lab 03/26/22 1344 03/26/22 1916  TROPONINIHS 11 12    BNP Invalid input(s): "POCBNP" D-Dimer No results for input(s): "DDIMER" in the last 72 hours.  Hemoglobin A1C No results for input(s): "HGBA1C" in the last 72 hours. Fasting Lipid Panel No results for input(s): "CHOL", "HDL", "LDLCALC", "TRIG", "CHOLHDL", "LDLDIRECT" in the last 72 hours. Thyroid Function Tests Recent Labs    03/26/22 1916  TSH 2.005   _____________  VAS US RENAL ARTERY DUPLEX  Result Date: 03/27/2022 ABDOMINAL VISCERAL Patient Name:  Edwin [name]  Date of Exam:   03/27/2022 Medical Rec #: [id number]            Accession #:    [id number] Date of Birth: 1980/08/21             Patient Gender: M Patient Age:   4 years Exam Location:  [facility] Procedure:      VAS US RENAL ARTERY DUPLEX Referring Phys: [id number] Lily Kocher -------------------------------------------------------------------------------- Indications: Hypertension High Risk Factors: Hypertension, current smoker. Limitations: Air/bowel gas. Comparison Study: No prior study Performing Technologist: Maudry Mayhew MHA, RDMS, RVT, RDCS  Examination Guidelines: A complete evaluation includes B-mode imaging, spectral Doppler, color Doppler, and power Doppler as needed of all accessible portions of each vessel. Bilateral testing is considered  an integral part of a complete examination. Limited examinations for reoccurring indications may be performed as noted.  Duplex Findings: +--------------------+--------+--------+------+------------------+ Mesenteric          PSV cm/sEDV cm/sPlaque     Comments      +--------------------+--------+--------+------+------------------+ Aorta Prox             65      8                             +--------------------+--------+--------+------+------------------+ Celiac Artery Origin   68                                    +--------------------+--------+--------+------+------------------+ SMA Proximal                              Unable to insonate +--------------------+--------+--------+------+------------------+    +------------------+--------+--------+------------------+ Right Renal ArteryPSV cm/sEDV cm/s     Comment       +------------------+--------+--------+------------------+ Origin                            Unable to insonate +------------------+--------+--------+------------------+ Proximal             35      13                      +------------------+--------+--------+------------------+ Mid                  42      17                      +------------------+--------+--------+------------------+ Distal               40      16                      +------------------+--------+--------+------------------+ +-----------------+--------+--------+--------+ Left Renal ArteryPSV cm/sEDV cm/sComment  +-----------------+--------+--------+--------+ Origin             104      37            +-----------------+--------+--------+--------+ Proximal  66      27   Tortuous +-----------------+--------+--------+--------+ Mid                 49      24            +-----------------+--------+--------+--------+ Distal              58      22            +-----------------+--------+--------+--------+  +------------+--------+--------+----+-----------+--------+--------+----+ Right KidneyPSV cm/sEDV cm/sRI  Left KidneyPSV cm/sEDV cm/sRI   +------------+--------+--------+----+-----------+--------+--------+----+ Upper Pole  18      8       0.57Upper Pole 31      13      0.57 +------------+--------+--------+----+-----------+--------+--------+----+ Mid         35      12      0.67Mid        39      19      0.51 +------------+--------+--------+----+-----------+--------+--------+----+ Lower Pole  22      9       0.60Lower Pole 28      14      0.49 +------------+--------+--------+----+-----------+--------+--------+----+ Hilar       38      21      0.44Hilar      51      19      0.63 +------------+--------+--------+----+-----------+--------+--------+----+ +------------------+---------+------------------+-----+ Right Kidney               Left Kidney             +------------------+---------+------------------+-----+ RAR                        RAR                     +------------------+---------+------------------+-----+ RAR (manual)      0.65     RAR (manual)      1.6   +------------------+---------+------------------+-----+ Cortex            23/8 cm/sCortex                  +------------------+---------+------------------+-----+ Cortex thickness           Corex thickness         +------------------+---------+------------------+-----+ Kidney length (cm)11.30    Kidney length (cm)11.60 +------------------+---------+------------------+-----+  Summary: Renal:  Right: No evidence of right renal artery stenosis. RRV flow present. Left:  No evidence of left renal artery stenosis. LRV flow present. Mesenteric: Normal Celiac artery findings.  *See table(s) above for measurements and observations.  Diagnosing physician: Servando Snare MD  Electronically signed by Servando Snare MD on 03/27/2022 at 3:30:27 PM.    Final    ECHOCARDIOGRAM COMPLETE  Result Date:  03/27/2022    ECHOCARDIOGRAM REPORT   Patient Name:   Edwin [name] Date of Exam: 03/27/2022 Medical Rec #:  [id number]           Height:       71.0 in Accession #:    [id number]          Weight:       199.0 lb Date of Birth:  January 11, 1981            BSA:          2.104 m Patient Age:    41 years            BP:  140/97 mmHg Patient Gender: M                   HR:           63 bpm. Exam Location:  Inpatient Procedure: 2D Echo, Cardiac Doppler and Color Doppler Indications:    A Fib  History:        Patient has no prior history of Echocardiogram examinations.                 ETOH abuse; Risk Factors:Hypertension and Dyslipidemia.  Sonographer:    Ula Lingo RDCS (AE, PE) Referring Phys: [id number] Edwin [name]  1. Left ventricular ejection fraction, by estimation, is 50 to 55%. Left ventricular ejection fraction by PLAX is 54 %. The left ventricle has low normal function. The left ventricle has no regional wall motion abnormalities. Left ventricular diastolic function could not be evaluated.  2. Right ventricular systolic function is mildly reduced. The right ventricular size is normal. Tricuspid regurgitation signal is inadequate for assessing PA pressure.  3. The mitral valve is abnormal. Trivial mitral valve regurgitation.  4. The aortic valve is tricuspid. Aortic valve regurgitation is not visualized.  5. The inferior vena cava is normal in size with <50% respiratory variability, suggesting right atrial pressure of 8 mmHg. Comparison(s): No prior Echocardiogram. FINDINGS  Left Ventricle: Left ventricular ejection fraction, by estimation, is 50 to 55%. Left ventricular ejection fraction by PLAX is 54 %. The left ventricle has low normal function. The left ventricle has no regional wall motion abnormalities. The left ventricular internal cavity size was normal in size. There is no left ventricular hypertrophy. Left ventricular diastolic function could not be evaluated due to atrial  fibrillation. Left ventricular diastolic function could not be evaluated. Right Ventricle: The right ventricular size is normal. No increase in right ventricular wall thickness. Right ventricular systolic function is mildly reduced. Tricuspid regurgitation signal is inadequate for assessing PA pressure. Left Atrium: Left atrial size was normal in size. Right Atrium: Right atrial size was normal in size. Pericardium: There is no evidence of pericardial effusion. Mitral Valve: The mitral valve is abnormal. There is moderate thickening of the anterior and posterior mitral valve leaflet(s). Trivial mitral valve regurgitation. Tricuspid Valve: The tricuspid valve is grossly normal. Tricuspid valve regurgitation is not demonstrated. Aortic Valve: The aortic valve is tricuspid. Aortic valve regurgitation is not visualized. Pulmonic Valve: The pulmonic valve was normal in structure. Pulmonic valve regurgitation is not visualized. Aorta: The aortic root and ascending aorta are structurally normal, with no evidence of dilitation. Venous: The inferior vena cava is normal in size with less than 50% respiratory variability, suggesting right atrial pressure of 8 mmHg. IAS/Shunts: No atrial level shunt detected by color flow Doppler.  LEFT VENTRICLE PLAX 2D LV EF:         Left            Diastology                ventricular     LV e' lateral:   10.70 cm/s                ejection        LV E/e' lateral: 7.6                fraction by                PLAX is 54                %.  LVIDd:         5.60 cm LVIDs:         4.00 cm LV PW:         1.00 cm LV IVS:        1.00 cm LVOT diam:     2.20 cm LV SV:         41 LV SV Index:   20 LVOT Area:     3.80 cm  RIGHT VENTRICLE RV S prime:     8.49 cm/s TAPSE (M-mode): 1.3 cm LEFT ATRIUM             Index        RIGHT ATRIUM           Index LA diam:        3.90 cm 1.85 cm/m   RA Area:     19.60 cm LA Vol (A2C):   75.5 ml 35.88 ml/m  RA Volume:   56.30 ml  26.76 ml/m LA Vol (A4C):   55.2 ml  26.23 ml/m LA Biplane Vol: 67.9 ml 32.27 ml/m  AORTIC VALVE LVOT Vmax:   70.50 cm/s LVOT Vmean:  47.100 cm/s LVOT VTI:    0.108 m  AORTA Ao Root diam: 3.10 cm MITRAL VALVE MV Area (PHT): 5.16 cm    SHUNTS MV Decel Time: 147 msec    Systemic VTI:  0.11 m MV E velocity: 81.80 cm/s  Systemic Diam: 2.20 cm Lyman Bishop MD Electronically signed by Lyman Bishop MD Signature Date/Time: 03/27/2022/11:23:48 AM    Final    DG Chest Port 1 View  Result Date: 03/26/2022 CLINICAL DATA:  Atrial fibrillation, edema lower extremities EXAM: PORTABLE CHEST 1 VIEW COMPARISON:  06/03/2017 FINDINGS: The heart size and mediastinal contours are within normal limits. Both lungs are clear. The visualized skeletal structures are unremarkable. IMPRESSION: No active disease. Electronically Signed   By: Elmer Picker M.D.   On: 03/26/2022 13:27   Disposition   Pt is being discharged home today in good condition.  Follow-up Plans & Appointments     Follow-up Information     Loel Dubonnet, NP Follow up on 04/19/2022.   Specialty: Cardiology Why: Appointment at 2:20 PM Contact information: [address] [place] [address] [phone]                Discharge Instructions     Amb referral to AFIB Clinic   Complete by: As directed    Diet - low sodium heart healthy   Complete by: As directed    Increase activity slowly   Complete by: As directed         Discharge Medications   Allergies as of 03/27/2022   No Known Allergies      Medication List     STOP taking these medications    amLODipine 10 MG tablet Commonly known as: NORVASC       TAKE these medications    carvedilol 12.5 MG tablet Commonly known as: COREG Take 1 tablet (12.5 mg total) by mouth 2 (two) times daily with a meal.   diltiazem 300 MG 24 hr capsule Commonly known as: CARDIZEM CD Take 1 capsule (300 mg total) by mouth daily. Start taking on: March 28, 2022   pantoprazole 40 MG  tablet Commonly known as: PROTONIX Take 1 tablet (40 mg total) by mouth daily. What changed:  when to take this reasons to take this   rivaroxaban 20 MG Tabs tablet Commonly known as: Alveda Reasons  Take 1 tablet (20 mg total) by mouth daily with supper.           Outstanding Labs/Studies   CBC at follow up   Duration of Discharge Encounter   Greater than 30 minutes including physician time.  Signed, Margie Billet, PA-C 03/27/2022, 4:59 PM

## 2022-03-27 NOTE — Progress Notes (Signed)
Rounding Note    Patient Name: Edwin [name] Date of Encounter: 03/27/2022  [facility] Cardiologist: None   Subjective   Feeling well.  Denies complaints.   Inpatient Medications    Scheduled Meds:  carvedilol  12.5 mg Oral BID WC   folic acid  1 mg Oral Daily   multivitamin with minerals  1 tablet Oral Daily   pantoprazole  40 mg Oral Daily   rivaroxaban  20 mg Oral Q supper   thiamine  100 mg Oral Daily   Or   thiamine  100 mg Intravenous Daily   Continuous Infusions:  diltiazem (CARDIZEM) infusion 10 mg/hr (03/27/22 0602)   PRN Meds: acetaminophen, LORazepam **OR** LORazepam, ondansetron (ZOFRAN) IV   Vital Signs    Vitals:   03/27/22 0615 03/27/22 0618 03/27/22 0630 03/27/22 0830  BP: (!) 141/97  (!) 140/97 (!) 138/102  Pulse:   67 61  Resp: (!) 22  (!) 22 19  Temp:  98.6 F (37 C)    TempSrc:  Oral    SpO2:   97% 100%  Weight:      Height:        Intake/Output Summary (Last 24 hours) at 03/27/2022 0959 Last data filed at 03/26/2022 1454 Gross per 24 hour  Intake 30.96 ml  Output --  Net 30.96 ml      03/26/2022    1:13 PM 03/26/2022   11:32 AM 01/11/2022    9:59 AM  Last 3 Weights  Weight (lbs) 199 lb 199 lb 196 lb 12.8 oz  Weight (kg) 90.266 kg 90.266 kg 89.268 kg      Telemetry    Atrial fibrillation.  Rate <100 bpm - Personally Reviewed  ECG    03/26/2022: Atrial fibrillation.  Rate 136 bpm.- Personally Reviewed  Physical Exam   VS:  BP (!) 138/102   Pulse 61   Temp 98.6 F (37 C) (Oral)   Resp 19   Ht 5\' 11"  (1.803 m)   Wt 90.3 kg   SpO2 100%   BMI 27.75 kg/m  , BMI Body mass index is 27.75 kg/m. GENERAL:  Well appearing HEENT: Pupils equal round and reactive, fundi not visualized, oral mucosa unremarkable NECK:  No jugular venous distention, waveform within normal limits, carotid upstroke brisk and symmetric, no bruits, no thyromegaly LUNGS:  Clear to auscultation bilaterally HEART: Irregularly  irregular PMI not displaced or sustained,S1 and S2 within normal limits, no S3, no S4, no clicks, no rubs, no murmurs ABD:  Flat, positive bowel sounds normal in frequency in pitch, no bruits, no rebound, no guarding, no midline pulsatile mass, no hepatomegaly, no splenomegaly EXT:  2 plus pulses throughout, no edema, no cyanosis no clubbing SKIN:  No rashes no nodules NEURO:  Cranial nerves II through XII grossly intact, motor grossly intact throughout PSYCH:  Cognitively intact, oriented to person place and time   Labs    High Sensitivity Troponin:   Recent Labs  Lab 03/26/22 1344 03/26/22 1916  TROPONINIHS 11 12     Chemistry Recent Labs  Lab 03/26/22 1344 03/27/22 0204  NA 140 136  K 3.2* 3.8  CL 101 100  CO2 26 26  GLUCOSE 101* 104*  BUN <5* 7  CREATININE 0.52* 0.66  CALCIUM 9.8 9.7  MG 1.5*  --   GFRNONAA >60 >60  ANIONGAP 13 10    Lipids No results for input(s): "CHOL", "TRIG", "HDL", "LABVLDL", "LDLCALC", "CHOLHDL" in the last 168 hours.  Hematology Recent  Labs  Lab 03/26/22 1344 03/27/22 0204  WBC 11.0* 8.3  RBC 4.27 4.18*  HGB 14.0 13.7  HCT 40.6 40.1  MCV 95.1 95.9  MCH 32.8 32.8  MCHC 34.5 34.2  RDW 14.5 14.3  PLT 202 192   Thyroid  Recent Labs  Lab 03/26/22 1916  TSH 2.005    BNP Recent Labs  Lab 03/26/22 1344  BNP 39.4    DDimer No results for input(s): "DDIMER" in the last 168 hours.   Radiology    VAS US RENAL ARTERY DUPLEX  Result Date: 03/27/2022 ABDOMINAL VISCERAL Patient Name:  Edwin [name]  Date of Exam:   03/27/2022 Medical Rec #: [id number]            Accession #:    [id number] Date of Birth: Feb 25, 1981             Patient Gender: M Patient Age:   41 years Exam Location:  [facility] Procedure:      VAS US RENAL ARTERY DUPLEX Referring Phys: [id number] Perlie Gold -------------------------------------------------------------------------------- Indications: Hypertension High Risk Factors: Hypertension, current  smoker. Limitations: Air/bowel gas. Comparison Study: No prior study Performing Technologist: Gertie Fey MHA, RDMS, RVT, RDCS  Examination Guidelines: A complete evaluation includes B-mode imaging, spectral Doppler, color Doppler, and power Doppler as needed of all accessible portions of each vessel. Bilateral testing is considered an integral part of a complete examination. Limited examinations for reoccurring indications may be performed as noted.  Duplex Findings: +--------------------+--------+--------+------+------------------+ Mesenteric          PSV cm/sEDV cm/sPlaque     Comments      +--------------------+--------+--------+------+------------------+ Aorta Prox             65      8                             +--------------------+--------+--------+------+------------------+ Celiac Artery Origin   68                                    +--------------------+--------+--------+------+------------------+ SMA Proximal                              Unable to insonate +--------------------+--------+--------+------+------------------+    +------------------+--------+--------+------------------+ Right Renal ArteryPSV cm/sEDV cm/s     Comment       +------------------+--------+--------+------------------+ Origin                            Unable to insonate +------------------+--------+--------+------------------+ Proximal             35      13                      +------------------+--------+--------+------------------+ Mid                  42      17                      +------------------+--------+--------+------------------+ Distal               40      16                      +------------------+--------+--------+------------------+ +-----------------+--------+--------+--------+ Left Renal ArteryPSV cm/sEDV cm/sComment  +-----------------+--------+--------+--------+  Origin             104      37             +-----------------+--------+--------+--------+ Proximal            66      27   Tortuous +-----------------+--------+--------+--------+ Mid                 49      24            +-----------------+--------+--------+--------+ Distal              58      22            +-----------------+--------+--------+--------+ +------------+--------+--------+----+-----------+--------+--------+----+ Right KidneyPSV cm/sEDV cm/sRI  Left KidneyPSV cm/sEDV cm/sRI   +------------+--------+--------+----+-----------+--------+--------+----+ Upper Pole  18      8       0.57Upper Pole 31      13      0.57 +------------+--------+--------+----+-----------+--------+--------+----+ Mid         35      12      0.67Mid        39      19      0.51 +------------+--------+--------+----+-----------+--------+--------+----+ Lower Pole  22      9       0.60Lower Pole 28      14      0.49 +------------+--------+--------+----+-----------+--------+--------+----+ Hilar       38      21      0.44Hilar      51      19      0.63 +------------+--------+--------+----+-----------+--------+--------+----+ +------------------+---------+------------------+-----+ Right Kidney               Left Kidney             +------------------+---------+------------------+-----+ RAR                        RAR                     +------------------+---------+------------------+-----+ RAR (manual)      0.65     RAR (manual)      1.6   +------------------+---------+------------------+-----+ Cortex            23/8 cm/sCortex                  +------------------+---------+------------------+-----+ Cortex thickness           Corex thickness         +------------------+---------+------------------+-----+ Kidney length (cm)11.30    Kidney length (cm)11.60 +------------------+---------+------------------+-----+   Summary: Renal:  Right: No evidence of right renal artery stenosis. RRV flow present.  Left:  No evidence of left renal artery stenosis. LRV flow present. Mesenteric: Normal Celiac artery findings.  *See table(s) above for measurements and observations.     Preliminary    DG Chest Port 1 View  Result Date: 03/26/2022 CLINICAL DATA:  Atrial fibrillation, edema lower extremities EXAM: PORTABLE CHEST 1 VIEW COMPARISON:  06/03/2017 FINDINGS: The heart size and mediastinal contours are within normal limits. Both lungs are clear. The visualized skeletal structures are unremarkable. IMPRESSION: No active disease. Electronically Signed   By: Elmer Picker M.D.   On: 03/26/2022 13:27    Cardiac Studies   Echo pending  Renal Dopplers pending  Patient Profile     Edwin [name] is a 11M with hypertension, hyperlipidemia, EtOH and tobacco abuse here with new onset  atrial fibrillation with RVR.  Assessment & Plan    # PAF:  New onset.  He has a history of heavy EtOH abuse, which is the likely trigger.  He was started on diltiazem and carvedilol and heart rates are better controlled.  We will transition to diltiazem 300 mg daily.  Stop the diltiazem infusion 1 hour after.  Continue carvedilol and amlodipine.  Echo is pending.  Thyroid function is normal.  He has been started on Xarelto with plans to follow-up in clinic in 3 weeks.  If he remains in atrial fibrillation plan for DCCV at that time.  Continue to express the importance of abstaining from alcohol.  Given his alcohol history and CHA2DS2Vasc of 1, plan for 1 month of anticoagulation post cardioversion.  Continue PPI.  Outpatient sleep study.   # Hypertension:  BP uncontrolled.Marland Kitchen  He is on diltiazem at the equivalent of 240 mg/day.  Will increase to 300 mg.  Continue carvedilol and amlodipine.  He is previously on HCTZ and lost 10.  These were discontinued for somewhat unclear reasons.  He thinks he had intolerance to one but is not sure which.  For questions or updates, please contact [facility] Please consult  [email or web address] for contact info under        Signed, Chilton Si, MD  03/27/2022, 9:59 AM

## 2022-03-30 ENCOUNTER — Ambulatory Visit: Payer: Commercial Managed Care - HMO | Attending: Cardiovascular Disease

## 2022-03-30 DIAGNOSIS — Z0000 Encounter for general adult medical examination without abnormal findings: Secondary | ICD-10-CM

## 2022-03-30 NOTE — Progress Notes (Signed)
Appointment Outcome: Completed, Session #: 4                        Start time: 10:01am   End time: 10:36am   Total Mins: 35 minutes  AGREEMENTS SECTION   Overall Goal(s): Alcohol cessation - Quit Date TBD Reduce cigarette smoking Medication compliance Improve healthy eating habits        Agreement/Action Steps:  Alcohol Cessation Reduce alcohol consumption over time Exercise instead of drinking during morning routine Take a walk at least 3xs/week for 30 minutes - 1 hour Reduce smoking Practice mini quits Extend time between smoking a cigarette for 30 minutes - 1 hour Wait 10-15 minutes after eating before smoking Don't smoke on breaks at work [employer] your support system   Medication Compliance Set an alarm for 4:00am to take medications in the morning    Improve Healthy Eating Habits Aim for 1,563m of sodium per day Don't use salt when cooking Use spices, herbs, or low/no sodium seasonings Eat fresh produce Rinse can goods before cooking Practice portion control    Progress Notes:  Patient stated that he is trying to wean himself off alcohol. Patient shared that he is still drinking about the same that he was two weeks ago with having 2-3 drinks during the morning compared to 7 drinks. Patient mentioned that he has been busy over the two past weeks with taking his son to daycare and taking care of other responsibilities. Patient reported that he has still maintain walking approximately 8 days over the past two weeks with his son. Patient mentioned that sometimes they go to the park to walk. Patient was trying to walk every day previously but couldn't walk as much. Patient shared that he also works on his car every day to keep himself occupied and to deter drinking.   Patient explained that he waits to let his food settle after eating and go about approximately 30 minutes before smoking. Patient stated that he finds himself having urges to smoke and he light up a cigarette  without thinking about it. Patient stated that he smokes more during the day at home. Patient was still able to go a span of 6-7 hours without waking up to smoke, but smokes after waking up. Patient was able to practice mini quits to extend time between cigarettes by at least 30 minutes. Patient will smoke a cigarette on his way to work and once on a work break and on his way home. Patient continues to smoke an average of 7 cigarettes per day but reported that he smokes more at home depending on the circumstances.   Patient shared that he continues to have the alarm set to remind him to take his medication at 4:00am. Patient stated that sometimes he cannot hear his alarm due to the noise at work. Patient reported that he has not been taking his medications recently and experienced the feeling of heaviness in his feet and legs. Patient had to be transported to the ED for irregular heart rhythm. Patient shared that he did not have the urge to smoke while he was in the hospital. Patient stated that he was scared and questioned all the medication he had to take. Patient has reflected on this incident and questions his behavior.  Patient reported that his eating habits are the same as they were the previous two weeks. Patient's eating behaviors have not worsened over the last two weeks. Patient shared that making progress with his  goals is all in the mind. Patient stated that he must tighten up on his behaviors.    Indicators of Success and Accountability:  Patient has been able to maintain low consumption of alcohol over the past two weeks.  Readiness: Patient is in the action stage of alcohol cessation, smoking cessation, medication compliance, and improving healthy eating behaviors.  Strengths and Supports: Patient is being supported by his family. Patient has been reflecting on his behaviors and experiences.  Challenges and Barriers: What could block the patient or prevent them from reaching their  goal?   Coaching Outcomes: Discussed with patient his why for quitting smoking and drinking and reflected more on his experience with his visit to the ER and another behavior he was able to quit prior to determine how he could use those experiences to aid him in his current goals.   Patient will continue reflections on how he wants his health to be within the next year and analyze how his current behavior can hinder him improving his health and being available for his youngest son.   Patient will continue to implement his action steps as outlined above over the next two weeks.   Discussed with patient additional strategies that he could implement such as not purchasing another pack of cigarettes to determine how long he can go without smoking, along with drinking water to curve urges to smoke. Patient will consider these as future action steps.   Mailed patient a copy of the Quit4Life Workbook for his review.     Attempted: Fulfilled - Patient continues to implement his action steps as outlined to improve healthy eating habits, and reduce alcohol consumption over time by walking, practice mini quits by extending time between smoking by at least 30 minutes between smoking and after eating and utilizing his support system.    Not met - Patient has not refrained from smoking on his breaks at work. Patient has not complied with taking his medications as prescribed over the past two weeks.

## 2022-04-03 ENCOUNTER — Encounter (HOSPITAL_BASED_OUTPATIENT_CLINIC_OR_DEPARTMENT_OTHER): Payer: Commercial Managed Care - HMO

## 2022-04-13 ENCOUNTER — Ambulatory Visit: Payer: Commercial Managed Care - HMO

## 2022-04-13 ENCOUNTER — Telehealth: Payer: Self-pay

## 2022-04-13 DIAGNOSIS — Z0000 Encounter for general adult medical examination without abnormal findings: Secondary | ICD-10-CM

## 2022-04-13 NOTE — Telephone Encounter (Signed)
Called patient to hold health coaching appointment over the phone. Patient did not answer. Left a message for patient to return call to hold session today or to reschedule.   Hinton Luellen [facility] Guide, Health Coach [address], [address] [place] [address] Telephone: [phone] Email: [email or web address]

## 2022-04-19 ENCOUNTER — Ambulatory Visit (HOSPITAL_BASED_OUTPATIENT_CLINIC_OR_DEPARTMENT_OTHER): Payer: Commercial Managed Care - HMO | Admitting: Family

## 2022-05-02 ENCOUNTER — Other Ambulatory Visit: Payer: Self-pay

## 2022-05-02 ENCOUNTER — Encounter (HOSPITAL_COMMUNITY): Payer: Self-pay | Admitting: Emergency Medicine

## 2022-05-02 ENCOUNTER — Emergency Department (HOSPITAL_COMMUNITY)
Admission: EM | Admit: 2022-05-02 | Discharge: 2022-05-03 | Disposition: A | Discharge status: Home / Self Care | Payer: Commercial Managed Care - HMO | Attending: Emergency Medicine | Admitting: Emergency Medicine

## 2022-05-02 ENCOUNTER — Emergency Department (HOSPITAL_COMMUNITY): Payer: Commercial Managed Care - HMO

## 2022-05-02 DIAGNOSIS — I480 Paroxysmal atrial fibrillation: Secondary | ICD-10-CM | POA: Insufficient documentation

## 2022-05-02 DIAGNOSIS — Z7901 Long term (current) use of anticoagulants: Secondary | ICD-10-CM | POA: Diagnosis not present

## 2022-05-02 DIAGNOSIS — K760 Fatty (change of) liver, not elsewhere classified: Secondary | ICD-10-CM | POA: Diagnosis not present

## 2022-05-02 DIAGNOSIS — E876 Hypokalemia: Secondary | ICD-10-CM

## 2022-05-02 DIAGNOSIS — I10 Essential (primary) hypertension: Secondary | ICD-10-CM | POA: Diagnosis not present

## 2022-05-02 DIAGNOSIS — R531 Weakness: Secondary | ICD-10-CM | POA: Insufficient documentation

## 2022-05-02 DIAGNOSIS — R0789 Other chest pain: Secondary | ICD-10-CM | POA: Diagnosis present

## 2022-05-02 DIAGNOSIS — R29898 Other symptoms and signs involving the musculoskeletal system: Secondary | ICD-10-CM

## 2022-05-02 DIAGNOSIS — Z79899 Other long term (current) drug therapy: Secondary | ICD-10-CM | POA: Diagnosis not present

## 2022-05-02 DIAGNOSIS — R0602 Shortness of breath: Secondary | ICD-10-CM | POA: Diagnosis not present

## 2022-05-02 DIAGNOSIS — R7401 Elevation of levels of liver transaminase levels: Secondary | ICD-10-CM

## 2022-05-02 LAB — CBC WITH DIFFERENTIAL/PLATELET
Abs Immature Granulocytes: 0.01 10*3/uL (ref 0.00–0.07)
Basophils Absolute: 0.1 10*3/uL (ref 0.0–0.1)
Basophils Relative: 1 %
Eosinophils Absolute: 0 10*3/uL (ref 0.0–0.5)
Eosinophils Relative: 0 %
HCT: 40 % (ref 39.0–52.0)
Hemoglobin: 13.8 g/dL (ref 13.0–17.0)
Immature Granulocytes: 0 %
Lymphocytes Relative: 29 %
Lymphs Abs: 2.2 10*3/uL (ref 0.7–4.0)
MCH: 33 pg (ref 26.0–34.0)
MCHC: 34.5 g/dL (ref 30.0–36.0)
MCV: 95.7 fL (ref 80.0–100.0)
Monocytes Absolute: 0.9 10*3/uL (ref 0.1–1.0)
Monocytes Relative: 12 %
Neutro Abs: 4.3 10*3/uL (ref 1.7–7.7)
Neutrophils Relative %: 58 %
Platelets: 185 10*3/uL (ref 150–400)
RBC: 4.18 MIL/uL — ABNORMAL LOW (ref 4.22–5.81)
RDW: 14.5 % (ref 11.5–15.5)
WBC: 7.4 10*3/uL (ref 4.0–10.5)
nRBC: 0 % (ref 0.0–0.2)

## 2022-05-02 LAB — TROPONIN I (HIGH SENSITIVITY)
Troponin I (High Sensitivity): 10 ng/L (ref <18)
Troponin I (High Sensitivity): 10 ng/L (ref <18)

## 2022-05-02 LAB — BASIC METABOLIC PANEL
Anion gap: 16 — ABNORMAL HIGH (ref 5–15)
BUN: 5 mg/dL — ABNORMAL LOW (ref 6–20)
CO2: 25 mmol/L (ref 22–32)
Calcium: 9 mg/dL (ref 8.9–10.3)
Chloride: 101 mmol/L (ref 98–111)
Creatinine, Ser: 0.63 mg/dL (ref 0.61–1.24)
GFR, Estimated: >60 mL/min (ref >60)
Glucose, Bld: 91 mg/dL (ref 70–99)
Potassium: 3.1 mmol/L — ABNORMAL LOW (ref 3.5–5.1)
Sodium: 142 mmol/L (ref 135–145)

## 2022-05-02 LAB — HEPATIC FUNCTION PANEL
ALT: 186 U/L — ABNORMAL HIGH (ref 0–44)
AST: 185 U/L — ABNORMAL HIGH (ref 15–41)
Albumin: 4.2 g/dL (ref 3.5–5.0)
Alkaline Phosphatase: 42 U/L (ref 38–126)
Bilirubin, Direct: 0.2 mg/dL (ref 0.0–0.2)
Indirect Bilirubin: 0.4 mg/dL (ref 0.3–0.9)
Total Bilirubin: 0.6 mg/dL (ref 0.3–1.2)
Total Protein: 7.3 g/dL (ref 6.5–8.1)

## 2022-05-02 LAB — LIPASE, BLOOD: Lipase: 57 U/L — ABNORMAL HIGH (ref 11–51)

## 2022-05-02 LAB — MAGNESIUM: Magnesium: 1.7 mg/dL (ref 1.7–2.4)

## 2022-05-02 LAB — BRAIN NATRIURETIC PEPTIDE: B Natriuretic Peptide: 13.3 pg/mL (ref 0.0–100.0)

## 2022-05-02 MED ORDER — THIAMINE HCL 100 MG/ML IJ SOLN
100.0000 mg | Freq: Every day | INTRAMUSCULAR | Status: DC
  Administered 2022-05-02: 100 mg via INTRAVENOUS
  Filled 2022-05-02: qty 2

## 2022-05-02 MED ORDER — POTASSIUM CHLORIDE CRYS ER 20 MEQ PO TBCR
60.0000 meq | EXTENDED_RELEASE_TABLET | Freq: Once | ORAL | Status: AC
  Administered 2022-05-02: 60 meq via ORAL
  Filled 2022-05-02: qty 3

## 2022-05-02 MED ORDER — POTASSIUM CHLORIDE ER 10 MEQ PO TBCR: 10.0000 meq | EXTENDED_RELEASE_TABLET | Freq: Every day | ORAL | 0 refills | Status: DC

## 2022-05-02 MED ORDER — CARVEDILOL 12.5 MG PO TABS
12.5000 mg | ORAL_TABLET | Freq: Once | ORAL | Status: AC
  Administered 2022-05-02: 12.5 mg via ORAL
  Filled 2022-05-02: qty 1

## 2022-05-02 MED ORDER — B COMPLEX VITAMINS PO CAPS: 1.0000 | ORAL_CAPSULE | Freq: Every day | ORAL | 0 refills | Status: DC

## 2022-05-02 MED ORDER — THIAMINE HCL 100 MG PO TABS: 100.0000 mg | ORAL_TABLET | Freq: Every day | ORAL | 0 refills | Status: DC

## 2022-05-02 MED ORDER — MAGNESIUM SULFATE IN D5W 1-5 GM/100ML-% IV SOLN
1.0000 g | Freq: Once | INTRAVENOUS | Status: AC
  Administered 2022-05-02: 1 g via INTRAVENOUS
  Filled 2022-05-02: qty 100

## 2022-05-02 NOTE — ED Provider Notes (Addendum)
[facility] EMERGENCY DEPARTMENT Provider Note   CSN: [id number] Arrival date & time: 05/02/22  1527     History  Chief Complaint  Patient presents with   Chest Pain    Edwin [name] is a 41 y.o. male.  With PMH of alcohol use, tobacco use, HTN, HLD, paroxysmal A-fib on Xarelto recently admitted for A-fib RVR presenting with bilateral leg heaviness.  In triage, patient had reported chest pain but when I was evaluate patient, he is not reporting any chest pain or shortness of breath.  Denying any leg swelling.  He just feels like his legs have been heavy and harder to move around recently.  He also endorses some mild tingling in both legs.  He has had no recent injuries or falls.  He is still ambulatory.  No facial droop, no slurred speech, no headache.  He is still drinking 1/5 of liquor daily.  He has been taking his medications as prescribed from recent admission in the hospital however he has not been taking any magnesium or potassium or vitamin supplementation.  He is denying any abdominal pain or vomiting.  He is tolerating p.o.  Denies any history of alcohol withdrawal seizures.  His last drink was this morning a few shots.   Chest Pain      Home Medications Prior to Admission medications   Medication Sig Start Date End Date Taking? Authorizing Provider  b complex vitamins capsule Take 1 capsule by mouth daily. 05/02/22  Yes Mardene Sayer, MD  potassium chloride (KLOR-CON) 10 MEQ tablet Take 1 tablet (10 mEq total) by mouth daily for 7 days. 05/02/22 05/09/22 Yes Mardene Sayer, MD  thiamine (VITAMIN B1) 100 MG tablet Take 1 tablet (100 mg total) by mouth daily. 05/02/22  Yes Mardene Sayer, MD  carvedilol (COREG) 12.5 MG tablet Take 1 tablet (12.5 mg total) by mouth 2 (two) times daily with a meal. 03/27/22   Jonita Albee, PA-C  diltiazem (CARDIZEM CD) 300 MG 24 hr capsule Take 1 capsule (300 mg total) by mouth daily. 03/28/22    Jonita Albee, PA-C  pantoprazole (PROTONIX) 40 MG tablet Take 1 tablet (40 mg total) by mouth daily. Patient taking differently: Take 40 mg by mouth as needed (stomach acid). 07/05/20   Philip Aspen, Limmie Patricia, MD  rivaroxaban (XARELTO) 20 MG TABS tablet Take 1 tablet (20 mg total) by mouth daily with supper. 03/27/22   Jonita Albee, PA-C      Allergies    Patient has no known allergies.    Review of Systems   Review of Systems  Cardiovascular:  Positive for chest pain.    Physical Exam Updated Vital Signs BP (!) 160/99   Pulse 81   Temp 98.3 F (36.8 C) (Oral)   Resp 16   SpO2 95%  Physical Exam Constitutional: Alert and oriented.  Resting in bed in no acute distress Eyes: Conjunctivae are normal. ENT      Head: Normocephalic and atraumatic. Cardiovascular: S1, S2,  Normal and symmetric distal pulses are present in all extremities.Warm and well perfused. Respiratory: Normal respiratory effort. Breath sounds are normal. Gastrointestinal: Soft and nontender. There is no CVA tenderness. Musculoskeletal: Normal range of motion in all extremities.      Right lower leg: No tenderness or edema.      Left lower leg: No tenderness or edema. Neurologic: Normal speech and language. No gross focal neurologic deficits are appreciated. Skin: Skin is warm, dry and  intact. No rash noted. Psychiatric: Mood and affect are normal. Speech and behavior are normal.  ED Results / Procedures / Treatments   Labs (all labs ordered are listed, but only abnormal results are displayed) Labs Reviewed  CBC WITH DIFFERENTIAL/PLATELET - Abnormal; Notable for the following components:      Result Value   RBC 4.18 (*)    All other components within normal limits  BASIC METABOLIC PANEL - Abnormal; Notable for the following components:   Potassium 3.1 (*)    BUN 5 (*)    Anion gap 16 (*)    All other components within normal limits  LIPASE, BLOOD - Abnormal; Notable for the following  components:   Lipase 57 (*)    All other components within normal limits  HEPATIC FUNCTION PANEL - Abnormal; Notable for the following components:   AST 185 (*)    ALT 186 (*)    All other components within normal limits  BRAIN NATRIURETIC PEPTIDE  MAGNESIUM  TROPONIN I (HIGH SENSITIVITY)  TROPONIN I (HIGH SENSITIVITY)    EKG EKG Interpretation  Date/Time:  Wednesday May 02 2022 15:31:17 EST Ventricular Rate:  98 PR Interval:  148 QRS Duration: 92 QT Interval:  418 QTC Calculation: 533 R Axis:   57 Text Interpretation: Normal sinus rhythm Nonspecific ST and T wave abnormality Prolonged QT Abnormal ECG When compared with ECG of 26-Mar-2022 13:12, PREVIOUS ECG IS PRESENT Nonspecific ST/T changes inferior leads similar to previous Confirmed by Georgina Snell [phone]) on 05/02/2022 8:44:54 PM  Radiology US Abdomen Limited RUQ (LIVER/GB)  Result Date: 05/02/2022 CLINICAL DATA:  Elevated liver enzymes, right upper quadrant pain EXAM: ULTRASOUND ABDOMEN LIMITED RIGHT UPPER QUADRANT COMPARISON:  None Available. FINDINGS: Gallbladder: No gallstones or wall thickening visualized. No sonographic Murphy sign noted by sonographer. Common bile duct: Diameter: 4 mm, within normal limits. No intrahepatic biliary ductal dilatation. Liver: No focal lesion identified. Increased parenchymal echogenicity. Portal vein is patent on color Doppler imaging with normal direction of blood flow towards the liver. Other: None. IMPRESSION: 1. No acute finding. 2. Increased liver parenchymal echogenicity, which is nonspecific but most commonly seen in hepatic steatosis. Electronically Signed   By: Merilyn Baba M.D.   On: 05/02/2022 22:33   DG Chest 2 View  Result Date: 05/02/2022 CLINICAL DATA:  Shortness of breath. EXAM: CHEST - 2 VIEW COMPARISON:  March 26, 2022.  June 03, 2017. FINDINGS: The heart size and mediastinal contours are within normal limits. Both lungs are clear. The visualized skeletal  structures are unremarkable. IMPRESSION: No active cardiopulmonary disease. Electronically Signed   By: Marijo Conception M.D.   On: 05/02/2022 16:19    Procedures .Critical Care  Performed by: Elgie Congo, MD Authorized by: Elgie Congo, MD   Critical care provider statement:    Critical care time (minutes):  30   Critical care was necessary to treat or prevent imminent or life-threatening deterioration of the following conditions:  Metabolic crisis (electrolyte abnormalities with qt prolongation requiring supplementation)   Critical care was time spent personally by me on the following activities:  Development of treatment plan with patient or surrogate, discussions with consultants, evaluation of patient's response to treatment, examination of patient, ordering and review of laboratory studies, ordering and review of radiographic studies, ordering and performing treatments and interventions, pulse oximetry, re-evaluation of patient's condition, review of old charts and obtaining history from patient or surrogate     Medications Ordered in ED Medications  magnesium sulfate IVPB  1 g 100 mL (1 g Intravenous New Bag/Given 05/02/22 2252)  thiamine (VITAMIN B1) injection 100 mg (100 mg Intravenous Given 05/02/22 2135)  potassium chloride SA (KLOR-CON M) CR tablet 60 mEq (60 mEq Oral Given 05/02/22 2133)  carvedilol (COREG) tablet 12.5 mg (12.5 mg Oral Given 05/02/22 2254)    ED Course/ Medical Decision Making/ A&P                           Medical Decision Making Edwin [name] is a 41 y.o. male.  With PMH of alcohol use, tobacco use, HTN, HLD, paroxysmal A-fib on Xarelto recently admitted for A-fib RVR presenting with bilateral leg heaviness.    Based on patient's history and presentation, suspect likely electrolyte abnormalities or vitamin deficiencies especially in the setting of chronic alcohol use.  Unlikely stroke with no localizing neurologic deficits.  No concern  for ischemic limbs with palpable DP pulses and good perfusion bilaterally.  No concern for cellulitis no evidence of skin changes.  Patient's labs were remarkable for a hypokalemia 3.1.  He also had evidence of transaminitis 185 AST 186 ALT and mildly elevated lipase 57 not consistent with acute pancreatitis.  He has no abdominal pain or vomiting on exam and he is tolerating p.o.  Right upper quadrant ultrasound was obtained with evidence of hepatic steatosis but no cholelithiasis or cholecystitis.  Atypical ACS workup was completed with EKG with nonspecific ST and T changes similar to previous EKG and prolonged QTc.  Likely due to hypokalemia and suspected hypomagnesemia.  Troponins were 10 and flat on repeat.  Not concerned for ACS.  Chest x-ray obtained with no pneumonia, no pneumothorax, no pleural effusions or pulmonary edema.  Discussed with patient findings of transaminitis secondary to alcohol use and hepatic steatosis and advised patient that he needs to cut back his drinking or else he will progress to eventual liver cirrhosis.  I also provided patient with potassium and magnesium supplementation in the ED and thiamine.  I advised that he needs to continue potassium as prescribed and thiamine and B complex multivitamins.  I discussed strict return precautions and following up with his PCP closely regarding his visit to the ER today.  He is in agreement with plan and safe for discharge.  Amount and/or Complexity of Data Reviewed Labs: ordered. Radiology: ordered.  Risk OTC drugs. Prescription drug management.    Final Clinical Impression(s) / ED Diagnoses Final diagnoses:  Atypical chest pain  Leg heaviness  Hypokalemia  Hepatic steatosis  Transaminitis    Rx / DC Orders ED Discharge Orders          Ordered    potassium chloride (KLOR-CON) 10 MEQ tablet  Daily        05/02/22 2301    b complex vitamins capsule  Daily        05/02/22 2301    thiamine (VITAMIN B1) 100 MG  tablet  Daily        05/02/22 2301              Elgie Congo, MD 05/02/22 [place], [place], MD 05/02/22 [phone]

## 2022-05-02 NOTE — ED Triage Notes (Signed)
Patient complains of chest pain and bilateral lower extremity swelling that started a few days ago. Patient is alert, oriented, ambulating independently with steady gait.

## 2022-05-02 NOTE — ED Provider Triage Note (Signed)
Emergency Medicine Provider Triage Evaluation Note  Edwin [name] , a 41 y.o. male  was evaluated in triage.  Pt complains of lower extremity swelling over the last 2 days.  He feels like his lower extremities are tight, feels short of breath with ambulation.  Also endorses intermittent chest pain.  History of A-fib rvr was admitted 2 weeks ago, has not been taking the blood thinner because it interferes with his alcohol usage..  Review of Systems  Per HPI  Physical Exam  BP (!) 164/113   Pulse 89   Temp 97.8 F (36.6 C) (Oral)   Resp 18   SpO2 97%  Gen:   Awake, no distress   Resp:  Normal effort  MSK:   Moves extremities without difficulty  Other:  Palpable pulses.  Regular rate and rhythm.  Medical Decision Making  Medically screening exam initiated at 3:53 PM.  Appropriate orders placed.  [name] was informed that the remainder of the evaluation will be completed by another provider, this initial triage assessment does not replace that evaluation, and the importance of remaining in the ED until their evaluation is complete.     Theron Arista, PA-C 05/02/22 1554

## 2022-05-02 NOTE — Discharge Instructions (Signed)
You were seen for lower leg heaviness and chest pain.  Overall your workup showed evidence of low potassium and fatty liver on your ultrasound with elevated liver labs.  You need to cut down on your drinking.  Continue taking the potassium tablets and the vitamins as prescribed.  You should follow-up with your primary care provider ideally within 1 to 2 weeks for repeat lab check.  Come back if any severe worsening chest pain, severe shortness of breath, inability to walk, facial droop, or any other symptoms concerning to you.

## 2022-05-07 ENCOUNTER — Telehealth: Payer: Self-pay

## 2022-05-07 NOTE — Telephone Encounter (Signed)
Transition Care Management Follow-up Telephone Call Date of discharge and from where: [facility] ER DC 05-02-22 Dx: atypical chest pain  How have you been since you were released from the hospital? Doing fine  Any questions or concerns? No  Items Reviewed: Did the pt receive and understand the discharge instructions provided? Yes  Medications obtained and verified? Yes  Other? No  Any new allergies since your discharge? No  Dietary orders reviewed? Yes Do you have support at home? Yes   Home Care and Equipment/Supplies: Were home health services ordered? no If so, what is the name of the agency? na  Has the agency set up a time to come to the patient's home? not applicable Were any new equipment or medical supplies ordered?  No What is the name of the medical supply agency? na Were you able to get the supplies/equipment? not applicable Do you have any questions related to the use of the equipment or supplies? No  Functional Questionnaire: (I = Independent and D = Dependent) ADLs: I  Bathing/Dressing- I  Meal Prep- I  Eating- I  Maintaining continence- I  Transferring/Ambulation- I  Managing Meds- I  Follow up appointments reviewed:  PCP Hospital f/u appt confirmed? Yes  [facility] No  . Are transportation arrangements needed? No  If their condition worsens, is the pt aware to call PCP or go to the Emergency Dept.? Yes Was the patient provided with contact information for the PCP's office or ED? Yes Was to pt encouraged to call back with questions or concerns? Yes   Woodfin Ganja LPN [facility] Nurse Health Advisor Direct Dial [phone]

## 2022-05-16 ENCOUNTER — Ambulatory Visit: Payer: Commercial Managed Care - HMO | Admitting: Internal Medicine

## 2022-07-13 ENCOUNTER — Telehealth: Payer: Self-pay

## 2022-07-13 NOTE — Telephone Encounter (Signed)
Vmt pt requesting call back about PREP referral

## 2022-11-28 ENCOUNTER — Encounter: Payer: Self-pay | Admitting: Internal Medicine

## 2022-11-28 ENCOUNTER — Ambulatory Visit (INDEPENDENT_AMBULATORY_CARE_PROVIDER_SITE_OTHER): Payer: BLUE CROSS/BLUE SHIELD | Admitting: Internal Medicine

## 2022-11-28 VITALS — BP 168/113 | HR 70 | Temp 98.3°F | Wt 202.7 lb

## 2022-11-28 DIAGNOSIS — K4090 Unilateral inguinal hernia, without obstruction or gangrene, not specified as recurrent: Secondary | ICD-10-CM | POA: Diagnosis not present

## 2022-11-28 DIAGNOSIS — I10 Essential (primary) hypertension: Secondary | ICD-10-CM | POA: Diagnosis not present

## 2022-11-28 DIAGNOSIS — E782 Mixed hyperlipidemia: Secondary | ICD-10-CM | POA: Diagnosis not present

## 2022-11-28 DIAGNOSIS — I480 Paroxysmal atrial fibrillation: Secondary | ICD-10-CM

## 2022-11-28 DIAGNOSIS — F1010 Alcohol abuse, uncomplicated: Secondary | ICD-10-CM

## 2022-11-28 DIAGNOSIS — Z91199 Patient's noncompliance with other medical treatment and regimen due to unspecified reason: Secondary | ICD-10-CM

## 2022-11-28 DIAGNOSIS — Z720 Tobacco use: Secondary | ICD-10-CM

## 2022-11-28 MED ORDER — DILTIAZEM HCL ER COATED BEADS 300 MG PO CP24: 300.0000 mg | ORAL_CAPSULE | Freq: Every day | ORAL | 3 refills | Status: DC

## 2022-11-28 MED ORDER — RIVAROXABAN 20 MG PO TABS: 20.0000 mg | ORAL_TABLET | Freq: Every day | ORAL | 11 refills | Status: DC

## 2022-11-28 MED ORDER — CARVEDILOL 12.5 MG PO TABS: 12.5000 mg | ORAL_TABLET | Freq: Two times a day (BID) | ORAL | 3 refills | Status: DC

## 2022-11-28 NOTE — Assessment & Plan Note (Signed)
General surgery referral placed today

## 2022-11-28 NOTE — Assessment & Plan Note (Signed)
Very uncontrolled.  Known to be nonadherent.  Sent in diltiazem and carvedilol, counseled on importance of compliance.

## 2022-11-28 NOTE — Assessment & Plan Note (Signed)
Appears to be in sinus rhythm today.  I have refilled Xarelto, Coreg, Cardizem and have placed referral for him to see cardiology again.

## 2022-11-28 NOTE — Progress Notes (Signed)
Established Patient Office Visit     CC/Reason for Visit: "Swelling left groin"  HPI: Edwin [name] is a 42 y.o. male who is coming in today for the above mentioned reasons. Past Medical History is significant for: Hypertension, hyperlipidemia, atrial fibrillation, tobacco and alcohol use as well as known documented history of nonadherence.  He comes in today with complaints of left groin swelling for about 2 months.  He notices that it becomes more prominent at the end of his shift, he works at [employer].  It has become more painful.  He is noted to have very elevated blood pressure today.  He was diagnosed with new onset A-fib last year.  Has not been adherent to treatment with diltiazem, carvedilol or rivaroxaban.  Has not followed up with cardiology since his hospital discharge.   Past Medical/Surgical History: Past Medical History:  Diagnosis Date   Alcohol abuse    Hypertension    Tobacco abuse     No past surgical history on file.  Social History:  reports that he has been smoking cigarettes. He has been smoking an average of 1 pack per day. He has never used smokeless tobacco. He reports current alcohol use of about 15.0 standard drinks of alcohol per week. He reports that he does not use drugs.  Allergies: No Known Allergies  Family History:  Family History  Problem Relation Age of Onset   Heart failure Mother    Hypertension Mother    Kidney disease Father    Heart failure Father    Hypertension Father      Current Outpatient Medications:    b complex vitamins capsule, Take 1 capsule by mouth daily., Disp: 60 capsule, Rfl: 0   pantoprazole (PROTONIX) 40 MG tablet, Take 1 tablet (40 mg total) by mouth daily. (Patient taking differently: Take 40 mg by mouth as needed (stomach acid).), Disp: 90 tablet, Rfl: 1   thiamine (VITAMIN B1) 100 MG tablet, Take 1 tablet (100 mg total) by mouth daily., Disp: 30 tablet, Rfl: 0   carvedilol (COREG) 12.5 MG tablet,  Take 1 tablet (12.5 mg total) by mouth 2 (two) times daily with a meal., Disp: 180 tablet, Rfl: 3   diltiazem (CARDIZEM CD) 300 MG 24 hr capsule, Take 1 capsule (300 mg total) by mouth daily., Disp: 90 capsule, Rfl: 3   potassium chloride (KLOR-CON) 10 MEQ tablet, Take 1 tablet (10 mEq total) by mouth daily for 7 days., Disp: 7 tablet, Rfl: 0   rivaroxaban (XARELTO) 20 MG TABS tablet, Take 1 tablet (20 mg total) by mouth daily with supper., Disp: 30 tablet, Rfl: 11  Review of Systems:  Negative unless indicated in HPI.   Physical Exam: Vitals:   11/28/22 0927 11/28/22 0932  BP: (!) 170/110 (!) 168/113  Pulse: 70   Temp: 98.3 F (36.8 C)   TempSrc: Oral   SpO2: 98%   Weight: 202 lb 11.2 oz (91.9 kg)     Body mass index is 28.27 kg/m.   Physical Exam Vitals reviewed.  Constitutional:      Appearance: Normal appearance.  HENT:     Head: Normocephalic and atraumatic.  Eyes:     Conjunctiva/sclera: Conjunctivae normal.     Pupils: Pupils are equal, round, and reactive to light.  Cardiovascular:     Rate and Rhythm: Normal rate and regular rhythm.  Pulmonary:     Effort: Pulmonary effort is normal.     Breath sounds: Normal breath sounds.  Abdominal:  Hernia: A hernia is present. Hernia is present in the left inguinal area.  Skin:    General: Skin is warm and dry.  Neurological:     General: No focal deficit present.     Mental Status: He is alert and oriented to person, place, and time.  Psychiatric:        Mood and Affect: Mood normal.        Behavior: Behavior normal.        Thought Content: Thought content normal.        Judgment: Judgment normal.      Impression and Plan:  Left inguinal hernia Assessment & Plan: General surgery referral placed today.  Orders: -     Ambulatory referral to General Surgery  Essential hypertension Assessment & Plan: Very uncontrolled.  Known to be nonadherent.  Sent in diltiazem and carvedilol, counseled on importance of  compliance.  Orders: -     Ambulatory referral to Cardiology  Paroxysmal atrial fibrillation [facility]) Assessment & Plan: Appears to be in sinus rhythm today.  I have refilled Xarelto, Coreg, Cardizem and have placed referral for him to see cardiology again.  Orders: -     Ambulatory referral to Cardiology -     Carvedilol; Take 1 tablet (12.5 mg total) by mouth 2 (two) times daily with a meal.  Dispense: 180 tablet; Refill: 3 -     dilTIAZem HCl ER Coated Beads; Take 1 capsule (300 mg total) by mouth daily.  Dispense: 90 capsule; Refill: 3 -     Rivaroxaban; Take 1 tablet (20 mg total) by mouth daily with supper.  Dispense: 30 tablet; Refill: 11  Mixed hyperlipidemia Assessment & Plan: Check lipids.   Tobacco abuse Assessment & Plan: No time to discuss cessation today.   ETOH abuse Assessment & Plan: Counseled on cessation.   Nonadherence to medical treatment Assessment & Plan: Have spent significant amount of time today counseling on importance of adherence to medications and follow-up visits.      Time spent:34 minutes reviewing chart, interviewing and examining patient and formulating plan of care.     Chaya Jan, MD [facility]

## 2022-11-28 NOTE — Assessment & Plan Note (Signed)
Have spent significant amount of time today counseling on importance of adherence to medications and follow-up visits.

## 2022-11-28 NOTE — Assessment & Plan Note (Signed)
Counseled on cessation 

## 2022-11-28 NOTE — Assessment & Plan Note (Signed)
No time to discuss cessation today.

## 2022-11-28 NOTE — Assessment & Plan Note (Signed)
Check lipids 

## 2022-12-20 ENCOUNTER — Encounter (HOSPITAL_COMMUNITY): Payer: Self-pay | Admitting: Emergency Medicine

## 2022-12-20 ENCOUNTER — Emergency Department (HOSPITAL_COMMUNITY)
Admission: EM | Admit: 2022-12-20 | Discharge: 2022-12-20 | Disposition: A | Discharge status: Home / Self Care | Payer: BLUE CROSS/BLUE SHIELD | Attending: Emergency Medicine | Admitting: Emergency Medicine

## 2022-12-20 DIAGNOSIS — Z79899 Other long term (current) drug therapy: Secondary | ICD-10-CM | POA: Insufficient documentation

## 2022-12-20 DIAGNOSIS — Z7901 Long term (current) use of anticoagulants: Secondary | ICD-10-CM | POA: Insufficient documentation

## 2022-12-20 DIAGNOSIS — K4090 Unilateral inguinal hernia, without obstruction or gangrene, not specified as recurrent: Secondary | ICD-10-CM | POA: Insufficient documentation

## 2022-12-20 DIAGNOSIS — R1032 Left lower quadrant pain: Secondary | ICD-10-CM | POA: Diagnosis present

## 2022-12-20 NOTE — ED Triage Notes (Signed)
Pt c/o  L sided inguinal hernia x 2 months. Pain worse when at end of his job due to standing and heavy lifting.

## 2022-12-20 NOTE — ED Provider Notes (Signed)
[place] EMERGENCY DEPARTMENT AT [facility] Provider Note   CSN: [id number] Arrival date & time: 12/20/22  0258     History  Chief Complaint  Patient presents with   Inguinal Hernia    Edwin [name] is a 42 y.o. male.  The history is provided by the patient and medical records.  Edwin [name] is a 42 y.o. male who presents to the Emergency Department complaining of abdominal pain.  He presents to the emergency department complaining of pain in the left inguinal region that has been present for the last 2 months.  Pain is described as a soreness, that is worse when he is lifting heavy items at work.  He has a bulging in that area, which resolves when he rests and sit down.  There is no pain when he is at rest.  No associated fevers, abdominal pain, dysuria, nausea, vomiting, diarrhea, constipation.  He does have a history of A-fib and is intermittently compliant with his medications.  He does also drink alcohol regularly.     Home Medications Prior to Admission medications   Medication Sig Start Date End Date Taking? Authorizing Provider  b complex vitamins capsule Take 1 capsule by mouth daily. 05/02/22   Mardene Sayer, MD  carvedilol (COREG) 12.5 MG tablet Take 1 tablet (12.5 mg total) by mouth 2 (two) times daily with a meal. 11/28/22   Philip Aspen, Limmie Patricia, MD  diltiazem (CARDIZEM CD) 300 MG 24 hr capsule Take 1 capsule (300 mg total) by mouth daily. 11/28/22   Philip Aspen, Limmie Patricia, MD  pantoprazole (PROTONIX) 40 MG tablet Take 1 tablet (40 mg total) by mouth daily. Patient taking differently: Take 40 mg by mouth as needed (stomach acid). 07/05/20   Philip Aspen, Limmie Patricia, MD  potassium chloride (KLOR-CON) 10 MEQ tablet Take 1 tablet (10 mEq total) by mouth daily for 7 days. 05/02/22 05/09/22  Mardene Sayer, MD  rivaroxaban (XARELTO) 20 MG TABS tablet Take 1 tablet (20 mg total) by mouth daily with supper. 11/28/22   Philip Aspen, Limmie Patricia, MD  thiamine (VITAMIN B1) 100 MG tablet Take 1 tablet (100 mg total) by mouth daily. 05/02/22   Mardene Sayer, MD      Allergies    Patient has no known allergies.    Review of Systems   Review of Systems  All other systems reviewed and are negative.   Physical Exam Updated Vital Signs BP (!) 169/108 (BP Location: Left Arm)   Pulse 97   Temp 98.3 F (36.8 C) (Oral)   Resp 18   SpO2 100%  Physical Exam Vitals and nursing note reviewed.  Constitutional:      Appearance: He is well-developed.  HENT:     Head: Normocephalic and atraumatic.  Cardiovascular:     Rate and Rhythm: Normal rate and regular rhythm.     Heart sounds: No murmur heard. Pulmonary:     Effort: Pulmonary effort is normal. No respiratory distress.     Breath sounds: Normal breath sounds.  Abdominal:     Palpations: Abdomen is soft.     Tenderness: There is no abdominal tenderness. There is no guarding or rebound.  Genitourinary:    Comments: There is a left inguinal hernia that is soft, easily reducible on examination. Musculoskeletal:        General: No tenderness.  Skin:    General: Skin is warm and dry.  Neurological:     Mental Status: He is  alert and oriented to person, place, and time.  Psychiatric:        Behavior: Behavior normal.     ED Results / Procedures / Treatments   Labs (all labs ordered are listed, but only abnormal results are displayed) Labs Reviewed - No data to display  EKG None  Radiology No results found.  Procedures Procedures    Medications Ordered in ED Medications - No data to display  ED Course/ Medical Decision Making/ A&P                             Medical Decision Making  Patient with history of hypertension, atrial fibrillation intermittently compliant with medications here for evaluation of groin pain.  He does have an inguinal hernia on examination that is soft and easily reduced.  No evidence of incarceration, patient  without systemic symptoms.  Discussed with patient that he will need to follow-up with surgery for definitive repair.  Discussed lifting precautions, return precautions for evidence of progression or incarceration.        Final Clinical Impression(s) / ED Diagnoses Final diagnoses:  Left inguinal hernia    Rx / DC Orders ED Discharge Orders     None         Tilden Fossa, MD 12/20/22 [phone]

## 2022-12-27 ENCOUNTER — Ambulatory Visit: Payer: BLUE CROSS/BLUE SHIELD | Admitting: Internal Medicine

## 2022-12-28 ENCOUNTER — Ambulatory Visit (INDEPENDENT_AMBULATORY_CARE_PROVIDER_SITE_OTHER): Payer: BLUE CROSS/BLUE SHIELD | Admitting: Family Medicine

## 2022-12-28 ENCOUNTER — Encounter: Payer: Self-pay | Admitting: Family Medicine

## 2022-12-28 VITALS — BP 170/118 | HR 100 | Temp 98.9°F | Wt 200.0 lb

## 2022-12-28 DIAGNOSIS — Z91199 Patient's noncompliance with other medical treatment and regimen due to unspecified reason: Secondary | ICD-10-CM | POA: Diagnosis not present

## 2022-12-28 DIAGNOSIS — I480 Paroxysmal atrial fibrillation: Secondary | ICD-10-CM

## 2022-12-28 DIAGNOSIS — I10 Essential (primary) hypertension: Secondary | ICD-10-CM

## 2022-12-28 DIAGNOSIS — K4090 Unilateral inguinal hernia, without obstruction or gangrene, not specified as recurrent: Secondary | ICD-10-CM

## 2022-12-28 NOTE — Progress Notes (Signed)
   Subjective:    Patient ID: Edwin [name], male    DOB: 29-Jun-1980, 42 y.o.   MRN: [id number]  HPI Here to follow up on an OV with Dr. Ardyth Harps on 11-28-22 and an ED visit on 12-20-22 for a left inguinal hernia. He has sharp pain in the left groin that comes and goes. This started about 3 months ago. He was referred to [facility] for this, but they told him they do not take his insurance [employer]). He also has not been taking any of his prescribed medications due to cost. His BP has been very elevated each time he sees someone. He also has atrial fibrillation, but he cannot afford Xarelto.    Review of Systems  Constitutional: Negative.   Respiratory: Negative.    Cardiovascular: Negative.   Gastrointestinal:  Positive for abdominal pain. Negative for abdominal distention, blood in stool, constipation, diarrhea, nausea and vomiting.  Genitourinary: Negative.        Objective:   Physical Exam Constitutional:      Appearance: Normal appearance. He is not ill-appearing.  Cardiovascular:     Rate and Rhythm: Normal rate and regular rhythm.     Pulses: Normal pulses.     Heart sounds: Normal heart sounds.  Pulmonary:     Effort: Pulmonary effort is normal.     Breath sounds: Normal breath sounds.  Abdominal:     General: Bowel sounds are normal. There is no distension.     Palpations: Abdomen is soft. There is no mass.     Tenderness: There is no guarding or rebound.     Comments: There is a small tender easily reversible left direct inguinal hernia   Neurological:     Mental Status: He is alert.           Assessment & Plan:  He has a left inguinal hernia that needs to be assessed by Surgery. We will contact his insurance company to see what general surgery group they will cover, and then we will refer him to them. In the meantime he is to rest and avoid heavy lifting. We wrote a letter to excuse him from work beginning yesterday until 01-14-23.  Since he has PAF and cannot get Xarelto, we supplied him with 7 weeks' worth of Eliquis samples to take 5 mg BID instead. He will try to get his BP medications filled asap. We spent a total of ( 35  ) minutes reviewing records and discussing these issues.  Gershon Crane, MD

## 2022-12-31 ENCOUNTER — Ambulatory Visit: Payer: BLUE CROSS/BLUE SHIELD | Admitting: Internal Medicine

## 2023-01-03 ENCOUNTER — Encounter: Payer: Self-pay | Admitting: Internal Medicine

## 2023-01-03 ENCOUNTER — Ambulatory Visit (INDEPENDENT_AMBULATORY_CARE_PROVIDER_SITE_OTHER): Payer: BLUE CROSS/BLUE SHIELD | Admitting: Internal Medicine

## 2023-01-03 VITALS — BP 180/120 | HR 110 | Temp 98.7°F | Wt 199.7 lb

## 2023-01-03 DIAGNOSIS — K4090 Unilateral inguinal hernia, without obstruction or gangrene, not specified as recurrent: Secondary | ICD-10-CM

## 2023-01-03 DIAGNOSIS — I10 Essential (primary) hypertension: Secondary | ICD-10-CM

## 2023-01-03 DIAGNOSIS — I480 Paroxysmal atrial fibrillation: Secondary | ICD-10-CM

## 2023-01-03 MED ORDER — APIXABAN 5 MG PO TABS: 5.0000 mg | ORAL_TABLET | Freq: Two times a day (BID) | ORAL | 3 refills | Status: DC

## 2023-01-03 NOTE — Assessment & Plan Note (Signed)
General surgery referral placed today

## 2023-01-03 NOTE — Progress Notes (Signed)
Established Patient Office Visit     CC/Reason for Visit: Follow-up chronic conditions  HPI: Edwin [name] is a 42 y.o. male who is coming in today for the above mentioned reasons. Past Medical History is significant for: Malignant hypertension, atrial fibrillation, a left inguinal hernia and known medication nonadherence.  Again his blood pressure is elevated today, he admits to not taking medication.  States he has been compliant with Eliquis since he was provided 7 weeks worth of samples at last visit.  He cannot afford anticoagulation.  Referral has been placed for him to have his left inguinal hernia repaired by surgery.  States he got a call back from [place] stating they did not accept his insurance.   Past Medical/Surgical History: Past Medical History:  Diagnosis Date   Alcohol abuse    Hypertension    Tobacco abuse     No past surgical history on file.  Social History:  reports that he has been smoking cigarettes. He has never used smokeless tobacco. He reports current alcohol use of about 15.0 standard drinks of alcohol per week. He reports that he does not use drugs.  Allergies: No Known Allergies  Family History:  Family History  Problem Relation Age of Onset   Heart failure Mother    Hypertension Mother    Kidney disease Father    Heart failure Father    Hypertension Father      Current Outpatient Medications:    apixaban (ELIQUIS) 5 MG TABS tablet, Take 1 tablet (5 mg total) by mouth 2 (two) times daily., Disp: 60 tablet, Rfl: 3   b complex vitamins capsule, Take 1 capsule by mouth daily., Disp: 60 capsule, Rfl: 0   carvedilol (COREG) 12.5 MG tablet, Take 1 tablet (12.5 mg total) by mouth 2 (two) times daily with a meal., Disp: 180 tablet, Rfl: 3   diltiazem (CARDIZEM CD) 300 MG 24 hr capsule, Take 1 capsule (300 mg total) by mouth daily., Disp: 90 capsule, Rfl: 3   pantoprazole (PROTONIX) 40 MG tablet, Take 1 tablet (40 mg total) by  mouth daily. (Patient taking differently: Take 40 mg by mouth as needed (stomach acid).), Disp: 90 tablet, Rfl: 1   potassium chloride (KLOR-CON) 10 MEQ tablet, Take 1 tablet (10 mEq total) by mouth daily for 7 days., Disp: 7 tablet, Rfl: 0   thiamine (VITAMIN B1) 100 MG tablet, Take 1 tablet (100 mg total) by mouth daily., Disp: 30 tablet, Rfl: 0  Review of Systems:  Negative unless indicated in HPI.   Physical Exam: Vitals:   01/03/23 0858  BP: (!) 180/120  Pulse: (!) 110  Temp: 98.7 F (37.1 C)  TempSrc: Oral  SpO2: 98%  Weight: 199 lb 11.2 oz (90.6 kg)    Body mass index is 27.85 kg/m.   Physical Exam Vitals reviewed.  Constitutional:      Appearance: Normal appearance.  HENT:     Head: Normocephalic and atraumatic.  Eyes:     Conjunctiva/sclera: Conjunctivae normal.     Pupils: Pupils are equal, round, and reactive to light.  Cardiovascular:     Rate and Rhythm: Normal rate and regular rhythm.  Pulmonary:     Effort: Pulmonary effort is normal.     Breath sounds: Normal breath sounds.  Skin:    General: Skin is warm and dry.  Neurological:     General: No focal deficit present.     Mental Status: He is alert and oriented to person, place,  and time.  Psychiatric:        Mood and Affect: Mood normal.        Behavior: Behavior normal.        Thought Content: Thought content normal.        Judgment: Judgment normal.      Impression and Plan:  Paroxysmal atrial fibrillation (HCC) Assessment & Plan: Appears to be in sinus rhythm today.  Scheduled to see cardiology next month.  Unable to afford Xarelto, was given samples of Eliquis last visit.  Will place a referral to pharmacy to see if he qualifies for any patient assistance.  Orders: -     Apixaban; Take 1 tablet (5 mg total) by mouth 2 (two) times daily.  Dispense: 60 tablet; Refill: 3 -     AMB Referral to Pharmacy Medication Management  Left inguinal hernia Assessment & Plan: General surgery referral  placed today.  Orders: -     Ambulatory referral to General Surgery  Essential hypertension Assessment & Plan: Very uncontrolled.  Known to be nonadherent.  Sent in diltiazem and carvedilol, counseled on importance of compliance.      Time spent:32 minutes reviewing chart, interviewing and examining patient and formulating plan of care.     Chaya Jan, MD [facility]

## 2023-01-03 NOTE — Assessment & Plan Note (Signed)
Very uncontrolled.  Known to be nonadherent.  Sent in diltiazem and carvedilol, counseled on importance of compliance.

## 2023-01-03 NOTE — Assessment & Plan Note (Signed)
Appears to be in sinus rhythm today.  Scheduled to see cardiology next month.  Unable to afford Xarelto, was given samples of Eliquis last visit.  Will place a referral to pharmacy to see if he qualifies for any patient assistance.

## 2023-01-17 ENCOUNTER — Telehealth: Payer: Self-pay

## 2023-01-17 NOTE — Progress Notes (Signed)
   Care Guide Note  01/17/2023 Name: Edwin [name] MRN: [id number] DOB: [birth year]  Referred by: Philip Aspen, Limmie Patricia, MD Reason for referral : Care Management (Outreach to schedule with Pharm d )   Edwin [name] is a 42 y.o. year old male who is a primary care patient of Philip Aspen, Limmie Patricia, MD. Edwin [name] was referred to the pharmacist for assistance related to HTN.    Successful contact was made with the patient to discuss pharmacy services including being ready for the pharmacist to call at least 5 minutes before the scheduled appointment time, to have medication bottles and any blood sugar or blood pressure readings ready for review. The patient agreed to meet with the pharmacist via with the pharmacist via telephone visit on (date/time).  01/30/2023  Penne Lash, RMA Care Guide [facility]  [place], [place] [address] Direct Dial: [phone] [email or web address]

## 2023-01-18 ENCOUNTER — Other Ambulatory Visit (HOSPITAL_COMMUNITY): Payer: Self-pay

## 2023-01-18 ENCOUNTER — Ambulatory Visit (INDEPENDENT_AMBULATORY_CARE_PROVIDER_SITE_OTHER): Payer: 59 | Admitting: Family

## 2023-01-18 ENCOUNTER — Encounter (HOSPITAL_BASED_OUTPATIENT_CLINIC_OR_DEPARTMENT_OTHER): Payer: Self-pay | Admitting: Family

## 2023-01-18 ENCOUNTER — Encounter (HOSPITAL_COMMUNITY): Payer: Self-pay

## 2023-01-18 VITALS — BP 146/98 | HR 85 | Ht 71.0 in | Wt 204.0 lb

## 2023-01-18 DIAGNOSIS — K219 Gastro-esophageal reflux disease without esophagitis: Secondary | ICD-10-CM | POA: Diagnosis not present

## 2023-01-18 DIAGNOSIS — K4090 Unilateral inguinal hernia, without obstruction or gangrene, not specified as recurrent: Secondary | ICD-10-CM

## 2023-01-18 DIAGNOSIS — R7401 Elevation of levels of liver transaminase levels: Secondary | ICD-10-CM

## 2023-01-18 DIAGNOSIS — I480 Paroxysmal atrial fibrillation: Secondary | ICD-10-CM

## 2023-01-18 DIAGNOSIS — Z720 Tobacco use: Secondary | ICD-10-CM

## 2023-01-18 DIAGNOSIS — Z789 Other specified health status: Secondary | ICD-10-CM

## 2023-01-18 DIAGNOSIS — I10 Essential (primary) hypertension: Secondary | ICD-10-CM | POA: Diagnosis not present

## 2023-01-18 MED ORDER — PANTOPRAZOLE SODIUM 40 MG PO TBEC
40.0000 mg | DELAYED_RELEASE_TABLET | Freq: Every day | ORAL | 1 refills | Status: AC
  Filled 2023-01-18 – 2023-02-12 (×3): qty 30, 30d supply, fill #0

## 2023-01-18 MED ORDER — APIXABAN 5 MG PO TABS
5.0000 mg | ORAL_TABLET | Freq: Two times a day (BID) | ORAL | 3 refills | Status: DC
  Filled 2023-01-18: qty 60, 30d supply, fill #0

## 2023-01-18 MED ORDER — NEBIVOLOL HCL 10 MG PO TABS
10.0000 mg | ORAL_TABLET | Freq: Every day | ORAL | 5 refills | Status: DC
  Filled 2023-01-18 – 2023-02-12 (×3): qty 30, 30d supply, fill #0

## 2023-01-18 MED ORDER — DILTIAZEM HCL ER COATED BEADS 300 MG PO CP24
300.0000 mg | ORAL_CAPSULE | Freq: Every day | ORAL | 3 refills | Status: DC
  Filled 2023-01-18 – 2023-02-12 (×3): qty 30, 30d supply, fill #0

## 2023-01-18 NOTE — Patient Instructions (Signed)
Medication Instructions:  Your physician has recommended you make the following change in your medication:  Stop: Carvedilol   Start: Nebivolol 10 daily in the evening  *If you need a refill on your cardiac medications before your next appointment, please call your pharmacy*   Lab Work: Your physician recommends that you return for lab work BMP & CBC next 1-2 weeks   Follow-Up: At [facility], you and your health needs are our priority.  As part of our continuing mission to provide you with exceptional heart care, we have created designated Provider Care Teams.  These Care Teams include your primary Cardiologist (physician) and Advanced Practice Providers (APPs -  Physician Assistants and Nurse Practitioners) who all work together to provide you with the care you need, when you need it.  We recommend signing up for the patient portal called "MyChart".  Sign up information is provided on this After Visit Summary.  MyChart is used to connect with patients for Virtual Visits (Telemedicine).  Patients are able to view lab/test results, encounter notes, upcoming appointments, etc.  Non-urgent messages can be sent to your provider as well.   To learn more about what you can do with MyChart, go to [email or web address].    Your next appointment:   1 month ADV HTN CLINIC

## 2023-01-18 NOTE — Progress Notes (Unsigned)
Advanced Hypertension Clinic Initial Assessment:    Date:  01/18/2023   ID:  Edwin [name], DOB [birth year], MRN [id number]  PCP:  Philip Aspen, Limmie Patricia, MD  Cardiologist:  Chilton Si, MD  Nephrologist:  Referring MD: Philip Aspen, Estel*   CC: Hypertension  History of Present Illness:    Edwin [name] is a 42 y.o. male with a hx of hypertension, hyperlipidemia, tobacco use, alcohol use here to establish care in the Advanced Hypertension Clinic.   Family history notable for both his father and mother having hypertension.  Father progressed to ESRD on HD and mother progressed to heart failure.  Both of his parents were diagnosed with hypertension when they were young.   Was seen 07/2021 by primary care with elevated blood pressure 184/140 in the setting of noncompliance with medical therapy.  At follow-up 08/15/2021 BP 170/120 with antihypertensive regimen amlodipine 10 mg daily, hydrochlorothiazide 25 mg daily.  Hydrochlorothiazide was transitioned to valsartan-hydrochlorothiazide.  We was referred to advanced heart failure clinic.  Edwin [name] was diagnosed with hypertension in his 68s. It has been difficult to control. Blood pressure checked with arm cuff at home. Has not been checking routinely at home.  Tells me he has not taken his medication in the last week-Per pharmacy report he has not picked up his antihypertensive agents.  he reports tobacco use one pack per day starting when he was 42 years old. Alcohol use with 10 standard drinks most days.  Does note that he uses alcohol as a coping mechanism as he lost both his mom and dad around the same time.  Offered my condolences.  He is interested in making a change.  For exercise he plays with his 82 year old son. he eats at home and outside of the home and does not follow low sodium diet.   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 His chief concerns regarding  antihypertensive agents are fatigue and erectile dysfunction.***  Checking BP at work - 31 year old son -  Eating lot of aslt Attributes high blood pressure to heavy drinking.  Also notes missing doses of his medication.    Fifth of liquor a day Motivated to cut back on alcohol use.  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  Current Medications: Current Meds  Medication Sig   apixaban (ELIQUIS) 5 MG TABS tablet Take 1 tablet (5 mg total) by mouth 2 (two) times daily.   b complex vitamins capsule Take 1 capsule by mouth daily.   carvedilol (COREG) 12.5 MG tablet Take 1 tablet (12.5 mg total) by mouth 2 (two) times daily with a meal.   diltiazem (CARDIZEM CD) 300 MG 24 hr capsule Take 1 capsule (300 mg total) by mouth daily.   pantoprazole (PROTONIX) 40 MG tablet Take 1 tablet (40 mg total) by mouth daily. (Patient taking differently: Take 40 mg by mouth as needed (stomach acid).)   thiamine (VITAMIN B1) 100 MG tablet Take 1 tablet (100 mg total) by mouth daily.     Allergies:   Patient  has no known allergies.   Social History   Socioeconomic History   Marital status: Single    Spouse name: Not on file   Number of children: Not on file   Years of education: Not on file   Highest education level: Not on file  Occupational History   Not on file  Tobacco Use   Smoking status: Every Day    Current packs/day: 1.00    Types: Cigarettes   Smokeless tobacco: Never  Substance and Sexual Activity   Alcohol use: Yes    Alcohol/week: 15.0 standard drinks of alcohol    Types: 15 Shots of liquor per week    Comment: one fifth gallon liquor every day   Drug  use: No   Sexual activity: Not on file  Other Topics Concern   Not on file  Social History Narrative   Not on file   Social Determinants of Health   Financial Resource Strain: Low Risk  (01/31/2022)   Overall Financial Resource Strain (CARDIA)    Difficulty of Paying Living Expenses: Not very hard  Food Insecurity: No Food Insecurity (01/31/2022)   Hunger Vital Sign    Worried About Running Out of Food in the Last Year: Never true    Ran Out of Food in the Last Year: Never true  Transportation Needs: No Transportation Needs (01/31/2022)   PRAPARE - Administrator, Civil Service (Medical): No    Lack of Transportation (Non-Medical): No  Physical Activity: Not on file  Stress: Not on file  Social Connections: Not on file     Family History: The patient's family history includes Heart failure in his father and mother; Hypertension in his father and mother; Kidney disease in his father.  ROS:   Please see the history of present illness.     All other systems reviewed and are negative.  EKGs/Labs/Other Studies Reviewed:    EKG:  EKG is ordered today.  The ekg ordered today demonstrates NSR 99 bpm with changes consistent with LVH and QTc manually via bazett formula 462  Recent Labs: 03/26/2022: TSH 2.005 05/02/2022: ALT 186; B Natriuretic Peptide 13.3; BUN 5; Creatinine, Ser 0.63; Hemoglobin 13.8; Magnesium 1.7; Platelets 185; Potassium 3.1; Sodium 142   Recent Lipid Panel    Component Value Date/Time   CHOL 226 (H) 08/01/2021 1008   TRIG 101.0 08/01/2021 1008   HDL 78.00 08/01/2021 1008   CHOLHDL 3 08/01/2021 1008   VLDL 20.2 08/01/2021 1008   LDLCALC 128 (H) 08/01/2021 1008   LDLCALC 115 (H) 10/09/2019 1538    Physical Exam:   VS:  BP (!) 146/98   Pulse 85   Ht 5\' 11"  (1.803 m)   Wt 204 lb (92.5 kg)   BMI 28.45 kg/m  , BMI Body mass index is 28.45 kg/m. GENERAL:  Well appearing HEENT: Pupils equal round and reactive, fundi not visualized, oral mucosa  unremarkable NECK:  No jugular venous distention, waveform within normal limits, carotid upstroke brisk and symmetric, no bruits, no thyromegaly LYMPHATICS:  No cervical adenopathy LUNGS:  Clear to auscultation bilaterally HEART:  RRR.  PMI not displaced or sustained,S1 and S2 within normal limits, no S3, no S4, no clicks, no rubs, no murmurs ABD:  Flat, positive bowel sounds normal in frequency in pitch, no bruits, no rebound, no guarding, no midline pulsatile mass, no hepatomegaly, no splenomegaly EXT:  2 plus pulses throughout, no edema, no cyanosis no clubbing SKIN:  No rashes no nodules NEURO:  Cranial  nerves II through XII grossly intact, motor grossly intact throughout [facility]:  Cognitively intact, oriented to person place and time   ASSESSMENT/PLAN:    HTN -BP not at goal of less than 130/80.  Does endorse noncompliance with medications.  Reassurance provided that if he notices side effects of fatigue or erectile dysfunction we would make medication changes.  He is agreeable to start amlodipine 10 mg daily. Mychart message in one week to check in. Discussed to monitor BP at home at least 2 hours after medications and sitting for 5-10 minutes.  TSH, CMP, renal and aldosterone today to assess for secondary hypertension with abnormal thyroid, hyperaldosteronism. Renal artery duplex to rule out renal stenosis is contributory. No indication for sleep study as he does not report snoring. Politely declined to participate in research study. Referred to PREP exercise program at [facility].  Tobacco use / ETOH use - 1 PPD and 10 standard drinks most days.  Cessation encouraged.  Referred to psychology as well as health coaching for coping mechanisms.  Encouraged to participate in Alcoholics Anonymous.  GERD - Notes indigestion with sensation of burning not relieved by Tums.  Recommend trial of OTC Prilosec for 2 weeks.  Anticipate heavy alcohol use also contributory.  Screening for Secondary  Hypertension:     01/11/2022    1:44 PM  Causes  Drugs/Herbals Screened     - Comments 01/11/22 Etoh and tobacco use encouraged. Referred to pscyh, health coach  Renovascular HTN Screened     - Comments 01/11/22 renal duplex ordered  Sleep Apnea Screened     - Comments 01/11/22 does not snore  Thyroid Disease Screened     - Comments 01/11/22 TSH ordered  Hyperaldosteronism Screened     - Comments 01/11/22 renin-aldosterone ordered  Coarctation of the Aorta Screened     - Comments 01/11/22 BP symmetrical  Compliance Screened     - Comments 01/11/22 noted noncompliance    Relevant Labs/Studies:    Latest Ref Rng & Units 05/02/2022    3:57 PM 03/27/2022    2:04 AM 03/26/2022    1:44 PM  Basic Labs  Sodium 135 - 145 mmol/L 142  136  140   Potassium 3.5 - 5.1 mmol/L 3.1  3.8  3.2   Creatinine 0.61 - 1.24 mg/dL 9.52  8.41  3.24        Latest Ref Rng & Units 03/26/2022    7:16 PM 01/11/2022   11:08 AM  Thyroid   TSH 0.350 - 4.500 uIU/mL 2.005  0.547        Latest Ref Rng & Units 01/11/2022   11:08 AM  Renin/Aldosterone   Aldosterone 0.0 - 30.0 ng/dL <4.0   Renin 1.027 - 2.536 ng/mL/hr 0.590   Aldos/Renin Ratio 0.0 - 30.0 <1.7              03/27/2022    9:10 AM  Renovascular   Renal Artery [place] Completed Yes      Disposition:    FU with MD/PharmD/APP in 1 month    Medication Adjustments/Labs and Tests Ordered: Current medicines are reviewed at length with the patient today.  Concerns regarding medicines are outlined above.  Orders Placed This Encounter  Procedures   EKG 12-Lead   No orders of the defined types were placed in this encounter.    Signed, Alver Sorrow, NP  01/18/2023 12:14 PM    [facility]

## 2023-01-20 ENCOUNTER — Encounter (HOSPITAL_BASED_OUTPATIENT_CLINIC_OR_DEPARTMENT_OTHER): Payer: Self-pay | Admitting: Family

## 2023-01-21 ENCOUNTER — Telehealth (HOSPITAL_BASED_OUTPATIENT_CLINIC_OR_DEPARTMENT_OTHER): Payer: Self-pay

## 2023-01-21 NOTE — Telephone Encounter (Signed)
[facility] not read, called patient, no answer, left detailed message (ok per DPR).

## 2023-01-21 NOTE — Telephone Encounter (Signed)
Returned call to patient, no answer, left detailed message with instructions to listen to previous VM for instructions.

## 2023-01-21 NOTE — Telephone Encounter (Signed)
Called pharmacy, message left on secure VM.

## 2023-01-21 NOTE — Telephone Encounter (Signed)
Patient returned RN's call. 

## 2023-01-21 NOTE — Telephone Encounter (Addendum)
[facility] sent to patient, will call pharmacy upon opening. Med list updated!   ----- Message from Alver Sorrow sent at 01/20/2023  7:36 PM EDT ----- Can we call him/ pharmacy to let them know okay to stop Eliquis? Has completed 1 month post self-cardioverting.

## 2023-01-30 ENCOUNTER — Other Ambulatory Visit: Payer: Self-pay

## 2023-01-30 NOTE — Progress Notes (Signed)
   01/30/2023  Patient ID: Edwin [name], male   DOB: 01-20-1981, 42 y.o.   MRN: [id number]  Outreach attempt for scheduled telephone visit to assist with affordability of Eliquis.  I was not able to reach the patient, as the number listed is currently not a working number.  Attempted to call x4 with no luck, and patient has not accessed MyChart since 2023.  There is an email on file that I am sending a HIPAA compliant email to with my direct phone number.  If I do not hear back, I will try to call again next week.  Lenna Gilford, PharmD, DPLA

## 2023-02-05 ENCOUNTER — Telehealth: Payer: Self-pay

## 2023-02-05 NOTE — Progress Notes (Signed)
   Care Guide Note  02/05/2023 Name: Edwin [name] MRN: [id number] DOB: [birth year]  Referred by: Philip Aspen, Limmie Patricia, MD Reason for referral : Care Coordination (Outreach to schedule with Pharm d )   Edwin [name] is a 42 y.o. year old male who is a primary care patient of Philip Aspen, Limmie Patricia, MD. Edwin [name] was referred to the pharmacist for assistance related to DM.    An unsuccessful telephone outreach was attempted today to contact the patient who was referred to the pharmacy team for assistance with medication assistance. Additional attempts will be made to contact the patient.   Penne Lash, RMA Care Guide [facility]  [place], [place] [address] Direct Dial: [phone] [email or web address]

## 2023-02-06 ENCOUNTER — Ambulatory Visit: Payer: 59 | Admitting: Internal Medicine

## 2023-02-07 ENCOUNTER — Ambulatory Visit: Payer: 59 | Admitting: Physician Assistant

## 2023-02-07 ENCOUNTER — Ambulatory Visit: Payer: Self-pay

## 2023-02-07 VITALS — BP 167/118 | HR 114 | Ht 71.0 in | Wt 197.0 lb

## 2023-02-07 DIAGNOSIS — K4090 Unilateral inguinal hernia, without obstruction or gangrene, not specified as recurrent: Secondary | ICD-10-CM

## 2023-02-07 DIAGNOSIS — I10 Essential (primary) hypertension: Secondary | ICD-10-CM | POA: Diagnosis not present

## 2023-02-07 NOTE — Progress Notes (Signed)
New Patient Office Visit  Subjective    Patient ID: Edwin [name], male    DOB: Dec 14, 1980  Age: 42 y.o. MRN: [id number]  CC:  Chief Complaint  Patient presents with    FMLA  Paperwork     HPI Edwin [name] request FMLA paperwork to be filled out.  States that he was unable to go to work due to abdominal pain due to hernia.  States that his primary care provider had written him out of work for 2 weeks which ended January 14, 2023.  States that he unfortunately was unable to return to primary care provider due to being dismissed from the practice.  States that he has not been able to follow-up with general surgery, states first referral did not accept his insurance, states that he was referred to [facility] on January 03, 2023 but has not heard from them.  States that he did follow-up with cardiology.  States that he is currently taking carvedilol instead of Bystolic, states that he is waiting for his medications to be delivered.  States that he is taking his anticoagulant.  States that he does not check his blood pressure at home.  States that his employer told him he was not able to return to work until he had his abdominal pain under control.  Outpatient Encounter Medications as of 02/07/2023  Medication Sig   b complex vitamins capsule Take 1 capsule by mouth daily.   diltiazem (CARDIZEM CD) 300 MG 24 hr capsule Take 1 capsule (300 mg total) by mouth daily.   nebivolol (BYSTOLIC) 10 MG tablet Take 1 tablet (10 mg total) by mouth daily.   pantoprazole (PROTONIX) 40 MG tablet Take 1 tablet (40 mg total) by mouth daily.   thiamine (VITAMIN B1) 100 MG tablet Take 1 tablet (100 mg total) by mouth daily.   No facility-administered encounter medications on file as of 02/07/2023.    Past Medical History:  Diagnosis Date   Alcohol abuse    Hypertension    Tobacco abuse     History reviewed. No pertinent surgical history.  Family History  Problem Relation Age  of Onset   Heart failure Mother    Hypertension Mother    Kidney disease Father    Heart failure Father    Hypertension Father     Social History   Socioeconomic History   Marital status: Single    Spouse name: Not on file   Number of children: Not on file   Years of education: Not on file   Highest education level: Not on file  Occupational History   Not on file  Tobacco Use   Smoking status: Every Day    Current packs/day: 1.00    Types: Cigarettes   Smokeless tobacco: Never  Substance and Sexual Activity   Alcohol use: Yes    Alcohol/week: 15.0 standard drinks of alcohol    Types: 15 Shots of liquor per week    Comment: one fifth gallon liquor every day   Drug use: No   Sexual activity: Not on file  Other Topics Concern   Not on file  Social History Narrative   Not on file   Social Determinants of Health   Financial Resource Strain: Low Risk  (01/31/2022)   Overall Financial Resource Strain (CARDIA)    Difficulty of Paying Living Expenses: Not very hard  Food Insecurity: No Food Insecurity (01/31/2022)   Hunger Vital Sign    Worried About Running Out of Food in  the Last Year: Never true    Ran Out of Food in the Last Year: Never true  Transportation Needs: No Transportation Needs (01/31/2022)   PRAPARE - Administrator, Civil Service (Medical): No    Lack of Transportation (Non-Medical): No  Physical Activity: Not on file  Stress: Not on file  Social Connections: Not on file  Intimate Partner Violence: Not on file    Review of Systems  Constitutional: Negative.   HENT: Negative.    Respiratory:  Negative for shortness of breath.   Cardiovascular:  Negative for chest pain and palpitations.  Gastrointestinal:  Positive for abdominal pain. Negative for nausea and vomiting.  Genitourinary: Negative.   Musculoskeletal: Negative.   Skin: Negative.   Neurological: Negative.   Endo/Heme/Allergies: Negative.   Psychiatric/Behavioral: Negative.           Objective    BP (!) 167/118   Pulse (!) 114   Ht 5\' 11"  (1.803 m)   Wt 197 lb (89.4 kg)   SpO2 97%   BMI 27.48 kg/m   Physical Exam Vitals and nursing note reviewed.  Constitutional:      General: He is not in acute distress.    Appearance: Normal appearance.  HENT:     Head: Normocephalic and atraumatic.     Right Ear: External ear normal.     Left Ear: External ear normal.     Nose: Nose normal.     Mouth/Throat:     Mouth: Mucous membranes are moist.     Pharynx: Oropharynx is clear.  Eyes:     Extraocular Movements: Extraocular movements intact.     Conjunctiva/sclera: Conjunctivae normal.     Pupils: Pupils are equal, round, and reactive to light.  Cardiovascular:     Rate and Rhythm: Regular rhythm. Tachycardia present.     Pulses: Normal pulses.     Heart sounds: Normal heart sounds.  Pulmonary:     Effort: Pulmonary effort is normal.     Breath sounds: Normal breath sounds.  Musculoskeletal:        General: Normal range of motion.     Cervical back: Normal range of motion and neck supple.  Skin:    General: Skin is warm and dry.  Neurological:     General: No focal deficit present.     Mental Status: He is alert and oriented to person, place, and time.  Psychiatric:        Mood and Affect: Mood normal.        Thought Content: Thought content normal.        Judgment: Judgment normal.         Assessment & Plan:   Problem List Items Addressed This Visit       Cardiovascular and Mediastinum   Essential hypertension - Primary     Other   Left inguinal hernia  1. Elevated with diagnosis of hypertension   Patient was strongly encouraged to follow-up with pharmacy, who is trying to get his medication delivered to him and help him afford his medications.  He is also strongly encouraged to follow-up with [facility].   contact information was given to him.  Patient was strongly encouraged to keep follow-up with cardiology.  2.  Left inguinal hernia Strongly encouraged prompt follow-up with [facility].  Red flags given for prompt reevaluation.   I have reviewed the patient's medical history (PMH, PSH, Social History, Family History, Medications, and allergies) , and have been  updated if relevant. I spent 20 minutes reviewing chart and  face to face time with patient.     Return if symptoms worsen or fail to improve.   Kasandra Knudsen Mayers, PA-C

## 2023-02-07 NOTE — Telephone Encounter (Signed)
Chief Complaint: Abdominal pain  Symptoms: left lower Abdominal pain, hernia, vomiting Frequency: vomiting comes and goes, abdominal pain constant Pertinent Negatives: Patient denies Chest pain, SOB, urinary symptoms Disposition: [] ED /[] Urgent Care (no appt availability in office) / [] Appointment(In office/virtual)/ []  [facility]/ [] Home Care/ [] Refused Recommended Disposition /[x] [facility]/ []  Follow-up with PCP Additional Notes: Patient stated he has had abdominal pain for about 3-4 months that has gradually gotten worse. Patient stated he was Dx by his PCP and the ED told him he had a hernia in July. Patient states the pain is in his lower left abdominal area. He is requesting a new patient appointment at [facility] at this time and also needs his FMLA paperwork filled out due to being out of work. Care advice was given and recommended patient be see with the mobile bus clinic today. Patient stated he can make it to the mobile bus clinic before last patient accepted. Patient has also been scheduled a new patient appointment in January 2025.   Reason for Disposition  [1] MILD-MODERATE pain AND [2] constant AND [3] present > 2 hours  Answer Assessment - Initial Assessment Questions 1. LOCATION: "Where does it hurt?"      Lower left side of the abdomen 2. RADIATION: "Does the pain shoot anywhere else?" (e.g., chest, back)     Radiates to my back  3. ONSET: "When did the pain begin?" (Minutes, hours or days ago)      3-4 months ago but getting worse  4. SUDDEN: "Gradual or sudden onset?"     Gradual  5. PATTERN "Does the pain come and go, or is it constant?"    - If it comes and goes: "How long does it last?" "Do you have pain now?"     (Note: Comes and goes means the pain is intermittent. It goes away completely between bouts.)    - If constant: "Is it getting better, staying the same, or getting worse?"      (Note: Constant means the pain never  goes away completely; most serious pain is constant and gets worse.)      Constant  6. SEVERITY: "How bad is the pain?"  (e.g., Scale 1-10; mild, moderate, or severe)    - MILD (1-3): Doesn't interfere with normal activities, abdomen soft and not tender to touch.     - MODERATE (4-7): Interferes with normal activities or awakens from sleep, abdomen tender to touch.     - SEVERE (8-10): Excruciating pain, doubled over, unable to do any normal activities.       7/10 7. RECURRENT SYMPTOM: "Have you ever had this type of stomach pain before?" If Yes, ask: "When was the last time?" and "What happened that time?"      No 8. CAUSE: "What do you think is causing the stomach pain?"     I have an abdominal hernia  9. RELIEVING/AGGRAVATING FACTORS: "What makes it better or worse?" (e.g., antacids, bending or twisting motion, bowel movement)     No nothing I can think of. 10. OTHER SYMPTOMS: "Do you have any other symptoms?" (e.g., back pain, diarrhea, fever, urination pain, vomiting)       Back pain, vomiting comes and goes  Protocols used: Abdominal Pain - Male-A-AH

## 2023-02-08 ENCOUNTER — Encounter: Payer: Self-pay | Admitting: Physician Assistant

## 2023-02-08 NOTE — Patient Instructions (Signed)
I strongly encourage you to follow-up with the contacts that I gave you on the mobile unit.  I encourage you to check your blood pressure at home, keep a written log and have available for all office visits.  If your blood pressure remains elevated, please follow-up with cardiology or return to the mobile unit as needed.  Please let us know if there is anything else we can do for you  Roney Jaffe, PA-C Physician Assistant [facility] [email or web address]   How to Take Your Blood Pressure Blood pressure is a measurement of how strongly your blood is pressing against the walls of your arteries. Arteries are blood vessels that carry blood from your heart throughout your body. Your health care provider takes your blood pressure at each office visit. You can also take your own blood pressure at home with a blood pressure monitor. You may need to take your own blood pressure to: Confirm a diagnosis of high blood pressure (hypertension). Monitor your blood pressure over time. Make sure your blood pressure medicine is working. Supplies needed: Blood pressure monitor. A chair to sit in. This should be a chair where you can sit upright with your back supported. Do not sit on a soft couch or an armchair. Table or desk. Small notebook and pencil or pen. How to prepare To get the most accurate reading, avoid the following for 30 minutes before you check your blood pressure: Drinking caffeine. Drinking alcohol. Eating. Smoking. Exercising. Five minutes before you check your blood pressure: Use the bathroom and urinate so that you have an empty bladder. Sit quietly in a chair. Do not talk. How to take your blood pressure To check your blood pressure, follow the instructions in the manual that came with your blood pressure monitor. If you have a digital blood pressure monitor, the instructions may be as follows: Sit up straight in a  chair. Place your feet on the floor. Do not cross your ankles or legs. Rest your left arm at the level of your heart on a table or desk or on the arm of a chair. Pull up your shirt sleeve. Wrap the blood pressure cuff around the upper part of your left arm, 1 inch (2.5 cm) above your elbow. It is best to wrap the cuff around bare skin. Fit the cuff snugly, but not too tightly, around your arm. You should be able to place only one finger between the cuff and your arm. Position the cord so that it rests in the bend of your elbow. Press the power button. Sit quietly while the cuff inflates and deflates. Read the digital reading on the monitor screen and write the numbers down (record them) in a notebook. Wait 2-3 minutes, then repeat the steps, starting at step 1. What does my blood pressure reading mean? A blood pressure reading consists of a higher number over a lower number. Ideally, your blood pressure should be below 120/80. The first ("top") number is called the systolic pressure. It is a measure of the pressure in your arteries as your heart beats. The second ("bottom") number is called the diastolic pressure. It is a measure of the pressure in your arteries as the heart relaxes. Blood pressure is classified into four stages. The following are the stages for adults who do not have a short-term serious illness or a chronic condition. Systolic pressure and diastolic pressure are measured in a unit called mm Hg (millimeters of mercury).  Normal Systolic pressure: below 120. Diastolic  pressure: below 80. Elevated Systolic pressure: 120-129. Diastolic pressure: below 80. Hypertension stage 1 Systolic pressure: 130-139. Diastolic pressure: 80-89. Hypertension stage 2 Systolic pressure: 140 or above. Diastolic pressure: 90 or above. You can have elevated blood pressure or hypertension even if only the systolic or only the diastolic number in your reading is higher than normal. Follow these  instructions at home: Medicines Take over-the-counter and prescription medicines only as told by your health care provider. Tell your health care provider if you are having any side effects from blood pressure medicine. General instructions Check your blood pressure as often as recommended by your health care provider. Check your blood pressure at the same time every day. Take your monitor to the next appointment with your health care provider to make sure that: You are using it correctly. It provides accurate readings. Understand what your goal blood pressure numbers are. Keep all follow-up visits. This is important. General tips Your health care provider can suggest a reliable monitor that will meet your needs. There are several types of home blood pressure monitors. Choose a monitor that has an arm cuff. Do not choose a monitor that measures your blood pressure from your wrist or finger. Choose a cuff that wraps snugly, not too tight or too loose, around your upper arm. You should be able to fit only one finger between your arm and the cuff. You can buy a blood pressure monitor at most drugstores or online. Where to find more information American Heart Association: www.heart.org Contact a health care provider if: Your blood pressure is consistently high. Your blood pressure is suddenly low. Get help right away if: Your systolic blood pressure is higher than 180. Your diastolic blood pressure is higher than 120. These symptoms may be an emergency. Get help right away. Call 911. Do not wait to see if the symptoms will go away. Do not drive yourself to the hospital. Summary Blood pressure is a measurement of how strongly your blood is pressing against the walls of your arteries. A blood pressure reading consists of a higher number over a lower number. Ideally, your blood pressure should be below 120/80. Check your blood pressure at the same time every day. Avoid caffeine, alcohol,  smoking, and exercise for 30 minutes prior to checking your blood pressure. These agents can affect the accuracy of the blood pressure reading. This information is not intended to replace advice given to you by your health care provider. Make sure you discuss any questions you have with your health care provider. Document Revised: 02/09/2021 Document Reviewed: 02/09/2021 Elsevier Patient Education  2024 [employer].

## 2023-02-12 ENCOUNTER — Other Ambulatory Visit (HOSPITAL_COMMUNITY): Payer: Self-pay

## 2023-02-12 ENCOUNTER — Ambulatory Visit: Payer: 59 | Admitting: Internal Medicine

## 2023-02-12 DIAGNOSIS — I10 Essential (primary) hypertension: Secondary | ICD-10-CM | POA: Diagnosis not present

## 2023-02-12 DIAGNOSIS — K4090 Unilateral inguinal hernia, without obstruction or gangrene, not specified as recurrent: Secondary | ICD-10-CM | POA: Diagnosis not present

## 2023-02-13 ENCOUNTER — Other Ambulatory Visit: Payer: Self-pay

## 2023-02-15 NOTE — Progress Notes (Signed)
   Care Guide Note  02/15/2023 Name: Edwin [name] MRN: [id number] DOB: 1980/08/06  Referred by: Philip Aspen, Limmie Patricia, MD Reason for referral : Care Coordination (Outreach to schedule with Pharm d )   Edwin [name] is a 42 y.o. year old male who is a primary care patient of Philip Aspen, Limmie Patricia, MD. Edwin [name] was referred to the pharmacist for assistance related to HTN.    Successful contact was made with the patient to discuss pharmacy services including being ready for the pharmacist to call at least 5 minutes before the scheduled appointment time, to have medication bottles and any blood sugar or blood pressure readings ready for review. The patient agreed to meet with the pharmacist via with the pharmacist via telephone visit on (date/time).  02/27/2023  Penne Lash, RMA Care Guide [place]  [place], [place] [address] Direct Dial: [phone] [email or web address]

## 2023-02-27 ENCOUNTER — Other Ambulatory Visit (HOSPITAL_COMMUNITY): Payer: Self-pay

## 2023-02-27 ENCOUNTER — Other Ambulatory Visit: Payer: Self-pay

## 2023-02-27 MED ORDER — APIXABAN 5 MG PO TABS
5.0000 mg | ORAL_TABLET | Freq: Two times a day (BID) | ORAL | 5 refills | Status: DC
  Filled 2023-02-27 – 2023-02-28 (×2): qty 60, 30d supply, fill #0

## 2023-02-27 NOTE — Progress Notes (Unsigned)
02/27/2023 Name: Edwin [name] MRN: [id number] DOB: [birth year]  Chief Complaint  Patient presents with   Medication Assistance   Edwin [name] is [name] 42 y.o. year old male who presented for [name] telephone visit.   They were referred to the pharmacist by their PCP for assistance in managing medication access.   Subjective:  Care Team: Primary Care Provider: [facility] Cardiologist: Gillian Shields ; Next Scheduled Visit: 03/21/23  Medication Access/Adherence  Current Pharmacy:  [id number] [place] - [address] AT [address] [place] [address] Phone: [phone] Fax: [phone]  [id number] - [place], [place] - [address] [place] [address] Phone: [phone] Fax: [phone]  [id number] - [place]), [place] - [address] [place] ([place]) [place] [address] Phone: [phone] Fax: [phone]  [facility] [address] [place] [address] Phone: [phone] Fax: [phone]  -Patient reports affordability concerns with their medications: Yes -Eliquis -Patient reports access/transportation concerns to their pharmacy: No -Enrolled in home delivery through [facility] -Patient reports adherence concerns with their medications:  Yes  Relying on PCP supplied samples for Eliquis 5mg  BID  Hypertension: Current medications: Cardizem CD 300mg  daily, nebivolol 10mg  daily -Patient does not check home BP regularly, but last OV reading on 8/29 as 167/118 -BP medications were filled by [facility] and delivered to the patient on 9/5, and he endorses taking them as prescribed.  No previous fill history of BP medications identified.  Objective: Lab Results  Component Value Date   CREATININE 0.63 05/02/2022   BUN 5 (L) 05/02/2022   NA 142 05/02/2022   K 3.1 (L) 05/02/2022   CL 101 05/02/2022   CO2 25 05/02/2022    Medications Reviewed Today     Reviewed by Edwin [name], RPH (Pharmacist) on 02/27/23 at [phone]  Med List Status: <None>   Medication Order Taking? Sig Documenting Provider Last Dose Status Informant  apixaban (ELIQUIS) 5 MG TABS tablet [id number] Yes Take 5 mg by mouth 2 (two) times daily. [provider] Taking Active            Med Note Edwin [name], Edwin [name]   Wed Feb 27, 2023  8:37 AM) samples  b complex vitamins capsule [id number] Yes Take 1 capsule by mouth daily. Mardene Sayer, MD Taking Active   diltiazem (CARDIZEM CD) 300 MG 24 hr capsule [id number] Yes Take 1 capsule (300 mg total) by mouth daily. Alver Sorrow, NP Taking Active   nebivolol (BYSTOLIC) 10 MG tablet [id number] Yes Take 1 tablet (10 mg total) by mouth daily. Alver Sorrow, NP Taking Active   pantoprazole (PROTONIX) 40 MG tablet [id number] No Take 1 tablet (40 mg total) by mouth daily.  Patient not taking: Reported on 02/27/2023   Alver Sorrow, NP Not Taking Active   thiamine (VITAMIN B1) 100 MG tablet [id number] No Take 1 tablet (100 mg total) by mouth daily.  Patient not taking: Reported on 02/27/2023   Mardene Sayer, MD Not Taking Active            Assessment/Plan:   Medication Access/Adherence - Upon running test claim on patient's insurance, Eliquis 5mg  BID is covered with [name] copay of $315.10/month - Obtained copay card that will take patient responsibility to $10/month with an annual savings of up to $6400.  Based on his copay, he will not exceed the yearly savings; so his copay  should remain $10. - Order pending for Eliquis 5mg  BID for PCP to sign  Hypertension: - Currently controlled - Patient currently failing TNM:  HTN in Black or African American Population - Recommend nurse or pharmacist visit to obtain BP now that patient on pharmacotherapy regimen for BP control  Follow Up Plan: Once Eliquis order is signed, I will contact pharmacy to verify copay and notify patient.  Forwarding note to PCP and [facility] Clinical Pharmacist, Delano Metz, to follow-up on BP  Edwin [name], PharmD, DPLA

## 2023-02-28 ENCOUNTER — Other Ambulatory Visit (HOSPITAL_COMMUNITY): Payer: Self-pay

## 2023-02-28 NOTE — Progress Notes (Signed)
02/28/2023  Patient ID: Edwin [name], male   DOB: Sep 29, 1980, 43 y.o.   MRN: [id number]  Eliquis order signed by PCP and sent to [facility] where patient is set up for home delivery service.  Contacted pharmacy, and the medication is going through for $10 copay.  Notifying PCP, Clinical Pharmacist, and sending patient a message via MyChart.  Lenna Gilford, PharmD, DPLA

## 2023-03-06 ENCOUNTER — Ambulatory Visit: Payer: 59 | Attending: Physician Assistant | Admitting: Pharmacist

## 2023-03-06 ENCOUNTER — Encounter: Payer: Self-pay | Admitting: Pharmacist

## 2023-03-06 ENCOUNTER — Other Ambulatory Visit: Payer: Self-pay

## 2023-03-06 ENCOUNTER — Other Ambulatory Visit (HOSPITAL_COMMUNITY): Payer: Self-pay

## 2023-03-06 VITALS — BP 167/104 | HR 77

## 2023-03-06 DIAGNOSIS — I10 Essential (primary) hypertension: Secondary | ICD-10-CM

## 2023-03-06 MED ORDER — VALSARTAN 80 MG PO TABS
80.0000 mg | ORAL_TABLET | Freq: Every day | ORAL | 0 refills | Status: DC
  Filled 2023-03-06: qty 90, 90d supply, fill #0
  Filled 2023-03-06 (×2): qty 30, 30d supply, fill #0

## 2023-03-06 NOTE — Progress Notes (Signed)
S:    42 y.o. male who presents for hypertension evaluation, education, and management.  PMH is significant for HTN, GERD, 10/16-10/17/24 admission with new onset atrial fibrillation with RVR (on Eliquis), a left inguinal hernia, medication nonadherence, and tobacco use. Patient is followed by cardiology at [facility] at [facility].   At last office visit for surgery consultation, BP was 172/88 mmHg on 9/3. Patient has a hernia repair surgery scheduled on 03/08/2023.  Today, patient arrives in spirits and presents without assistance. Denies dizziness, headache, blurred vision, swelling but reports nausea possibly d/t hernia. Repots smoking one hour before coming in for apt.   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 Patient has not taken BP medications today.   Current antihypertensives include: diltiazem 300 mg 24 hr daily, nebivolol (Bystolic) 10 mg daily  Antihypertensives tried in the past include: amlodipine (started diltiazem for rate control), hydrochlorothiazide, valsartan-hydrochlorothiazide  - Pt reports not experiencing any adverse effects with valsartan in the past  Reported home BP readings: typically 150/90s   Patient reported dietary habits: Eats 2 meals/day - Notes eating high sodium diet but has cut back salt intake  Patient-reported exercise habits: walking with friend occasionally    ASCVD risk factors include: HTN, afib, tobacco use  O:   Last 3 Office BP readings: BP Readings from Last 3 Encounters:  03/06/23 (!) 167/104  02/07/23 (!) 167/118  01/18/23 (!) 146/98    BMET    Component Value Date/Time   NA 142 05/02/2022 1557   NA 142 01/11/2022 1108   K 3.1 (L) 05/02/2022 1557    CL 101 05/02/2022 1557   CO2 25 05/02/2022 1557   GLUCOSE 91 05/02/2022 1557   BUN 5 (L) 05/02/2022 1557   BUN 7 01/11/2022 1108   CREATININE 0.63 05/02/2022 1557   CREATININE 0.58 (L) 10/09/2019 1538   CALCIUM 9.0 05/02/2022 1557   GFRNONAA >60 05/02/2022 1557   GFRAA >60 06/03/2017 1540    Renal function: CrCl cannot be calculated (Patient's most recent lab result is older than the maximum 21 days allowed.).  Clinical ASCVD: No  The 10-year ASCVD risk score (Arnett DK, et al., 2019) is: 12.9%   Values used to calculate the score:     Age: 40 years     Sex: Male     Is Non-Hispanic African American: Yes     Diabetic: No     Tobacco smoker: Yes     Systolic Blood Pressure: 167 mmHg     Is BP treated: Yes     HDL Cholesterol: 78 mg/dL     Total Cholesterol: 226 mg/dL  Patient is participating in a Managed Medicaid Plan: No    A/P: Hypertension diagnosed, currently uncontrolled on current medications. BP goal < 130/80 mmHg. Medication adherence appears suboptimal. Control is suboptimal due to non-adherence and need for further hypertension medication management. Patient's current cardiovascular agents are targeting his new onset atrial fibrillation with RVR more than hypertension, as they offer more rate control than blood pressure benefit. Patient would benefit from an ARB or ACE-I to bring BP to goal. Since patient does not recall experiencing adverse effects with valsartan, plan to retrial drug.  -Continue diltiazem 300  mg 24 hr daily and nebivolol (Bystolic) 10 mg daily -Begin valsartan 80 mg daily  -Patient educated on purpose, proper use, and potential adverse effects of losartan.  -F/u labs ordered - CMP. F/u to evaluate current potassium level since patient is initiating an ARB.  -Counseled on lifestyle modifications for blood pressure control including reduced dietary sodium, increased exercise, adequate sleep. -Encouraged patient to check BP at home and bring log of  readings to next visit.  Results reviewed and written information provided.    Written patient instructions provided. Patient verbalized understanding of treatment plan.  Total time in face to face counseling 20 minutes.    Follow-up:  Cardiology on 03/21/2023 Pharmacist on 04/19/2023 PCP clinic visit on 06/13/2023  Roslyn Smiling, PharmD PGY1 Pharmacy Resident 03/06/2023 11:03 AM

## 2023-03-07 LAB — CMP14+EGFR
ALT: 53 IU/L — ABNORMAL HIGH (ref 0–44)
AST: 66 IU/L — ABNORMAL HIGH (ref 0–40)
Albumin: 4.5 g/dL (ref 4.1–5.1)
Alkaline Phosphatase: 55 IU/L (ref 44–121)
BUN/Creatinine Ratio: 8 — ABNORMAL LOW (ref 9–20)
BUN: 5 mg/dL — ABNORMAL LOW (ref 6–24)
Bilirubin Total: 0.5 mg/dL (ref 0.0–1.2)
CO2: 22 mmol/L (ref 20–29)
Calcium: 9.6 mg/dL (ref 8.7–10.2)
Chloride: 100 mmol/L (ref 96–106)
Creatinine, Ser: 0.59 mg/dL — ABNORMAL LOW (ref 0.76–1.27)
Globulin, Total: 2.4 g/dL (ref 1.5–4.5)
Glucose: 85 mg/dL (ref 70–99)
Potassium: 3.8 mmol/L (ref 3.5–5.2)
Sodium: 142 mmol/L (ref 134–144)
Total Protein: 6.9 g/dL (ref 6.0–8.5)
eGFR: 126 mL/min/{1.73_m2} (ref >59)

## 2023-03-08 DIAGNOSIS — K4090 Unilateral inguinal hernia, without obstruction or gangrene, not specified as recurrent: Secondary | ICD-10-CM | POA: Diagnosis not present

## 2023-03-08 DIAGNOSIS — R1909 Other intra-abdominal and pelvic swelling, mass and lump: Secondary | ICD-10-CM | POA: Diagnosis not present

## 2023-03-18 ENCOUNTER — Telehealth: Payer: Self-pay | Admitting: Physician Assistant

## 2023-03-18 NOTE — Telephone Encounter (Signed)
Contacted pt to inform the location of [facility] for the week. Pt stated he would look on the [facility] website to check for a different location that works best for him.

## 2023-03-21 ENCOUNTER — Encounter (HOSPITAL_BASED_OUTPATIENT_CLINIC_OR_DEPARTMENT_OTHER): Payer: Self-pay | Admitting: Family

## 2023-03-21 ENCOUNTER — Ambulatory Visit (INDEPENDENT_AMBULATORY_CARE_PROVIDER_SITE_OTHER): Payer: 59 | Admitting: Family

## 2023-03-21 VITALS — BP 160/110 | HR 95 | Ht 71.0 in | Wt 205.9 lb

## 2023-03-21 DIAGNOSIS — Z789 Other specified health status: Secondary | ICD-10-CM

## 2023-03-21 DIAGNOSIS — R7401 Elevation of levels of liver transaminase levels: Secondary | ICD-10-CM

## 2023-03-21 DIAGNOSIS — E782 Mixed hyperlipidemia: Secondary | ICD-10-CM

## 2023-03-21 DIAGNOSIS — Z720 Tobacco use: Secondary | ICD-10-CM | POA: Diagnosis not present

## 2023-03-21 DIAGNOSIS — I10 Essential (primary) hypertension: Secondary | ICD-10-CM | POA: Diagnosis not present

## 2023-03-21 NOTE — Patient Instructions (Addendum)
Medication Instructions:   STOP Eliquis  RESUME taking Nebivolol 10mg , Diltiazem 300mg , and Valsartan 80mg  DAILY    Follow-Up: follow up in 2-3 months with Hypertension Clinic    Special Instructions:  Please check BP at home at least 3 times per week. Recommend checking after sitting for 5-10 minutes and at least an hour after you take your medications.

## 2023-03-21 NOTE — Progress Notes (Signed)
[facility]pert[facility] Clinic Initial Assessment:    Date:  03/21/2023   ID:  Edwin [name], DOB 03-Oct-1980, MRN [id number]  PCP:  Roney Jaffe, PA-C  Cardiologist:  Chilton Si, MD  Nephrologist:  Referring MD: Philip Aspen, Est[place]*   CC: Hypertension  History of Present Illness:    Edwin [name] is a 42 y.o. male with a hx of hypertension, hyperlipidemia, tobacco use, alcohol use here to follow up in the [facility] Hypertension Clinic. Family history notable for dad with ESRD and mom with heart failure, both had hypertension.   Family history notable for both his father and mother having hypertension.  Father progressed to ESRD on HD and mother progressed to heart failure.  Both of his parents were diagnosed with hypertension when they were young.   Was seen 07/2021 by primary care with elevated blood pressure 184/140 in the setting of noncompliance with medical therapy.  At follow-up 08/15/2021 BP 170/120 with antihypertensive regimen amlodipine 10 mg daily, hydrochlorothiazide 25 mg daily.  Hydrochlorothiazide was transitioned to valsartan-hydrochlorothiazide.   Established with [facility] 01/11/22. Roscoe Witts was diagnosed with hypertension in his 2s. Smoking 1 PPD and drinking 10 standard drinks most days. Did note that he uses alcohol as a coping mechanism as he lost both his mom and dad around the same time. Was not following low sodium diet. He was not routinely taking medications and importance of adherence discussed. Amlodipine 10mg  daily. OTC Prilosec recommended for GERD.   Admitted 10/16-10/17/23 with new onset atrial fibrillation with RVR. Treated with Diltiazem, OAC. Etoh felt to be trigger. Echo during admission LVEF 50-55%, no RWMA, mildly reduced RV function. Renal duplex 03/28/23 no RAS. Normal thyroid function. Plan for 1 month of OAC post cardioversion given CHADS2VASc of 1. Recommended for outpatient sleep study. Discharged on  Cardizem 300mg  every day, Coreg 12.5mg  BID. He was not taking hydrochlorothiazide nor ARB prior to admission.   Last seen 01/18/23. Carvedilol was switched Nebivolol for easier once daily dosing. Stopped OAC as CHADS2VASc 1. He had inguinal hernia repair performed 03/08/23.   Presents today for follow up. 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 BP elevated, 160/110, today. Admits to taking medications sporadically since hernia repair, and has not taken them the last three days. Not monitoring BP at home. Drinking fifth of liquor daily and smoking 1 PPD. Desires to cut back on this. Has still been taking Eliquis periodically but did note stopping it for surgery and resuming it two days later. Reports no shortness of breath nor dyspnea on exertion. Reports no chest pain, pressure, or tightness. No edema, orthopnea, PND. Reports no palpitations.   Previous antihypertensives: Hydrochlorothiazide Valsartan Losartan  Past Medical History:  Diagnosis Date   Alcohol abuse    Hypertension    Tobacco abuse     No past surgical history on file.  Current Medications: Current Meds  Medication Sig   apixaban (ELIQUIS) 5 MG TABS tablet Take 1 tablet (5 mg total) by mouth 2 (two) times daily.   b complex vitamins capsule Take 1 capsule by mouth daily.   diltiazem (CARDIZEM CD) 300 MG 24 hr capsule Take 1 capsule (300 mg total) by mouth daily.   HYDROcodone-acetaminophen (NORCO/VICODIN) 5-325 MG tablet Take 1 tablet by mouth every 6 (six) hours as needed.   nebivolol (BYSTOLIC) 10 MG tablet Take 1 tablet (10 mg total) by mouth daily.   pantoprazole (PROTONIX) 40 MG tablet  Take 1 tablet (40 mg total) by mouth daily.   valsartan (DIOVAN) 80 MG tablet Take 1 tablet (80 mg total) by mouth daily.     Allergies:   Patient has no known allergies.   Social History   Socioeconomic History   Marital status: Single    Spouse name: Not on file   Number of  children: Not on file   Years of education: Not on file   Highest education level: Not on file  Occupational History   Not on file  Tobacco Use   Smoking status: Every Day    Current packs/day: 1.00    Types: Cigarettes   Smokeless tobacco: Never  Substance and Sexual Activity   Alcohol use: Yes    Alcohol/week: 15.0 standard drinks of alcohol    Types: 15 Shots of liquor per week    Comment: one fifth gallon liquor every day   Drug use: No   Sexual activity: Not on file  Other Topics Concern   Not on file  Social History Narrative   Not on file   Social Determinants of Health   Financial Resource Strain: Low Risk  (01/31/2022)   Overall Financial Resource Strain (CARDIA)    Difficulty of Paying Living Expenses: Not very hard  Food Insecurity: No Food Insecurity (01/31/2022)   Hunger Vital Sign    Worried About Running Out of Food in the Last Year: Never true    Ran Out of Food in the Last Year: Never true  Transportation Needs: No Transportation Needs (01/31/2022)   PRAPARE - Administrator, Civil Service (Medical): No    Lack of Transportation (Non-Medical): No  Physical Activity: Not on file  Stress: Not on file  Social Connections: Not on file     Family History: The patient's family history includes Heart failure in his father and mother; Hypertension in his father and mother; Kidney disease in his father.  ROS:   Please see the history of present illness.     All other systems reviewed and are negative.  EKGs/Labs/Other Studies Reviewed:         Recent Labs: 03/26/2022: TSH 2.005 05/02/2022: B Natriuretic Peptide 13.3; Hemoglobin 13.8; Magnesium 1.7; Platelets 185 03/06/2023: ALT 53; BUN 5; Creatinine, Ser 0.59; Potassium 3.8; Sodium 142   Recent Lipid Panel    Component Value Date/Time   CHOL 226 (H) 08/01/2021 1008   TRIG 101.0 08/01/2021 1008   HDL 78.00 08/01/2021 1008   CHOLHDL 3 08/01/2021 1008   VLDL 20.2 08/01/2021 1008   LDLCALC  128 (H) 08/01/2021 1008   LDLCALC 115 (H) 10/09/2019 1538    Physical Exam:   VS:  BP (!) 160/110 (BP Location: Left Arm, Patient Position: Sitting, Cuff Size: Normal)   Pulse 95   Ht 5\' 11"  (1.803 m)   Wt 205 lb 14.4 oz (93.4 kg)   SpO2 95%   BMI 28.72 kg/m  , BMI Body mass index is 28.72 kg/m. GENERAL:  Well appearing HEENT: Pupils equal round and reactive, fundi not visualized, oral mucosa unremarkable NECK:  No jugular venous distention, waveform within normal limits, carotid upstroke brisk and symmetric, no bruits, no thyromegaly LYMPHATICS:  No cervical adenopathy LUNGS:  Clear to auscultation bilaterally HEART:  RRR.  PMI not displaced or sustained,S1 and S2 within normal limits, no S3, no S4, no clicks, no rubs, no murmurs ABD:  Flat, positive bowel sounds normal in frequency in pitch, no bruits, no rebound, no guarding, no midline  pulsatile mass, no hepatomegaly, no splenomegaly EXT:  2 plus pulses throughout, no edema, no cyanosis no clubbing SKIN:  No rashes no nodules NEURO:  Cranial nerves II through XII grossly intact, motor grossly intact throughout PSYCH:  Cognitively intact, oriented to person place and time   ASSESSMENT/PLAN:    HTN -BP not at goal of less than 130/80.  Does endorse noncompliance with medications. Encouraged to restart taking diltiazem, nebivolol, and valsartan daily. Will not adjust any medications due to noncompliance. Discussed to monitor BP a home at least 2 hours after medications and sitting for 5-10 minutes. If BP not at goal in future plan to further increase Valsartan.  PAF - Noted 03/2023, self converted. Completed 3 weeks of OAC post cardioversion. Maintaining NSR today by auscultation. No recent palpitations. CHA2DS2-VASc Score = 1 [CHF History: 0, HTN History: 1, Diabetes History: 0, Stroke History: 0, Vascular Disease History: 0, Age Score: 0, Gender Score: 0].  Therefore, the patient's annual risk of stroke is 0.6 %. Given CHADSVasc only  1, Eliquis was previously stopped. He did not stop this. Discussed again during office visit today. Discussed need to reduce etoh to reduce risk of recurrent PAF.   Tobacco use / ETOH use - 1 PPD and a fifth of liquor daily. Cessation encouraged. Previously given list of AA meetings/times in [place] and highlighted one near his home and one near his work. Did not attend any meetings, but is interested in finding some again. Again given list of current AA meetings and encouraged to attend when he gets off work in the mornings. Motivated to reduce etoh intake, thinks he will smoke less if he drinks less. Continue thiamine supplementation.   Transaminitis - related to etoh. 03/06/23 AST 66, ALT 53 which is improved from previous. Etoh cessation encouraged.  GERD - Continue Protonix.Marland Kitchen  Anticipate heavy alcohol use contributory.  Screening for Secondary Hypertension:  Click here to document screening for secondary causes of HTN  :[id number]}     01/11/2022    1:44 PM 01/20/2023    7:34 PM  Causes  Drugs/Herbals Screened      - Comments 01/11/22 Etoh and tobacco use encouraged. Referred to pscyh, health coach   Renovascular HTN Screened Screened     - Comments 01/11/22 renal duplex ordered 03/2022 renal duplex no RAS  Sleep Apnea Screened      - Comments 01/11/22 does not snore   Thyroid Disease Screened Screened     - Comments 01/11/22 TSH ordered 03/2022 normal TSH  Hyperaldosteronism Screened Screened     - Comments 01/11/22 renin-aldosterone ordered 01/2022 normal renin aldosterone  Cushing's Syndrome  N/A     - Comments  non-cushingoid appearance  Coarctation of the Aorta Screened      - Comments 01/11/22 BP symmetrical   Compliance Screened      - Comments 01/11/22 noted noncompliance     Relevant Labs/Studies:    Latest Ref Rng & Units 03/06/2023   10:51 AM 05/02/2022    3:57 PM 03/27/2022    2:04 AM  Basic Labs  Sodium 134 - 144 mmol/L 142  142  136   Potassium 3.5 - 5.2 mmol/L 3.8  3.1  3.8    Creatinine 0.76 - 1.27 mg/dL 5.28  4.13  2.44        Latest Ref Rng & Units 03/26/2022    7:16 PM 01/11/2022   11:08 AM  Thyroid   TSH 0.350 - 4.500 uIU/mL 2.005  0.547  Latest Ref Rng & Units 01/11/2022   11:08 AM  Renin/Aldosterone   Aldosterone 0.0 - 30.0 ng/dL <1.6   Renin 1.096 - 0.454 ng/mL/hr 0.590   Aldos/Renin Ratio 0.0 - 30.0 <1.7              03/27/2022    9:10 AM  Renovascular   Renal Artery [place] Completed Yes      Disposition:    FU with MD/PharmD/APP in 2-3 months.   Medication Adjustments/Labs and Tests Ordered: Current medicines are reviewed at length with the patient today.  Concerns regarding medicines are outlined above.  No orders of the defined types were placed in this encounter.  No orders of the defined types were placed in this encounter.    Signed, Alver Sorrow, NP  03/21/2023 10:55 AM    [facility]

## 2023-03-28 DIAGNOSIS — Z8719 Personal history of other diseases of the digestive system: Secondary | ICD-10-CM | POA: Diagnosis not present

## 2023-03-28 DIAGNOSIS — Z9889 Other specified postprocedural states: Secondary | ICD-10-CM | POA: Diagnosis not present

## 2023-04-05 DIAGNOSIS — I4891 Unspecified atrial fibrillation: Secondary | ICD-10-CM | POA: Diagnosis not present

## 2023-04-05 DIAGNOSIS — Z720 Tobacco use: Secondary | ICD-10-CM | POA: Diagnosis not present

## 2023-04-05 DIAGNOSIS — Z8249 Family history of ischemic heart disease and other diseases of the circulatory system: Secondary | ICD-10-CM | POA: Diagnosis not present

## 2023-04-05 DIAGNOSIS — Z791 Long term (current) use of non-steroidal anti-inflammatories (NSAID): Secondary | ICD-10-CM | POA: Diagnosis not present

## 2023-04-05 DIAGNOSIS — K219 Gastro-esophageal reflux disease without esophagitis: Secondary | ICD-10-CM | POA: Diagnosis not present

## 2023-04-05 DIAGNOSIS — I10 Essential (primary) hypertension: Secondary | ICD-10-CM | POA: Diagnosis not present

## 2023-04-18 NOTE — Progress Notes (Deleted)
S:    42 y.o. male who presents for hypertension evaluation, education, and management.  PMH is significant for HTN (dx at age 40s), GERD, 10/16-10/17/24 admission with new onset atrial fibrillation with RVR (on Eliquis), left inguinal hernia repair 03/08/23, medication nonadherence, and tobacco use. Patient is followed by cardiology at [facility] & [facility] at [facility].   Patient was last seen by pharmacy on 03/06/23. At this last visit. Patient reported non adherence to diltiazem and nebivolol. As both these agents primarily offer rate control for afib, valsartan was added for better BP control.  Patient then visited cardiology at the [facility] on 03/21/23. At this visit, BP was 160/110 and patient endorsed non compliance with no BP monitoring at home. Patient recommended to restart diltiazem, nebivolol, and valsartan.  Today, patient arrives in good spirits and presents without assistance. *** Denies dizziness, headache, blurred vision, swelling. *** Reports smoking one hour before coming in for appt. 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 Patient has/has not taken BP medications today. ***  Current antihypertensives include: diltiazem 300mg  24 hr daily, nebivolol (Bystolic) 10mg  daily, valsartan 80mg  daily  Antihypertensives tried in the past include: amlodipine (started diltiazem for rate control), hydrochlorothiazide, valsartan-hydrochlorothiazide, carvedilol (switched to nebivolol for easier dosing)  Reported home BP readings: ***  Patient reported dietary habits: Eats 2 meals/day - Notes eating high sodium diet but has cut back salt intake  Patient-reported exercise  habits: walking with friend occasionally    ASCVD risk factors include: HTN, afib, tobacco use  O:   Last 3 Office BP readings: BP Readings from Last 3 Encounters:  03/21/23 (!) 160/110  03/06/23 (!) 167/104  02/07/23 (!) 167/118    BMET    Component Value Date/Time   NA 142 03/06/2023 1051   K 3.8 03/06/2023 1051   CL 100 03/06/2023 1051   CO2 22 03/06/2023 1051   GLUCOSE 85 03/06/2023 1051   GLUCOSE 91 05/02/2022 1557   BUN 5 (L) 03/06/2023 1051   CREATININE 0.59 (L) 03/06/2023 1051   CREATININE 0.58 (L) 10/09/2019 1538   CALCIUM 9.6 03/06/2023 1051   GFRNONAA >60 05/02/2022 1557   GFRAA >60 06/03/2017 1540    Renal function: CrCl cannot be calculated (Patient's most recent lab result is older than the maximum 21 days allowed.).  Clinical ASCVD: No  The 10-year ASCVD risk score (Arnett DK, et al., 2019) is: 12.6%   Values used to calculate the score:     Age: 53 years     Sex: Male     Is Non-Hispanic African American: Yes     Diabetic: No     Tobacco smoker: Yes     Systolic Blood Pressure: 165 mmHg     Is BP treated: Yes     HDL Cholesterol: 78 mg/dL     Total Cholesterol: 226 mg/dL  A/P: Hypertension longstanding, currently uncontrolled on current medications. BP goal < 130/80 mmHg. Medication adherence appears suboptimal. Control is suboptimal due to non-adherence and need for further hypertension medication management. As patient visits the [facility]pert[facility] Clinic with cardiology, will defer further HTN management. -Continue diltiazem 300mg  24 hr daily -Continue nebivolol (Bystolic) 10mg  daily -Continue valsartan 80mg   daily  -F/u labs ordered - none if poor valsartan compliance -Counseled on lifestyle modifications for blood pressure control including reduced dietary sodium, increased exercise, adequate sleep. -Encouraged patient to check BP at home and bring log of readings to next visit.  Results reviewed and written information provided.     Written patient instructions provided. Patient verbalized understanding of treatment plan.  Total time in face to face counseling *** minutes.    Follow-up:  Pharmacist: as needed PCP clinic visit: 06/13/2023 Cardiology: 06/13/23  Nicole Kindred, PharmD PGY1 Pharmacy Resident 04/18/2023 9:48 PM

## 2023-04-19 ENCOUNTER — Ambulatory Visit: Payer: 59 | Admitting: Pharmacist

## 2023-05-21 ENCOUNTER — Ambulatory Visit: Payer: 59 | Admitting: Pharmacist

## 2023-06-12 NOTE — Progress Notes (Deleted)
 [facility] Assessment:    Date:  06/12/2023   ID:  Edwin [name], DOB December 12, 1980, MRN [id number]  PCP:  No primary care provider on file.  Cardiologist:  Annabella Scarce, MD  Nephrologist:  Referring MD: Darcus Kirk RAMAN, PA-C   CC: Hypertension  History of Present Illness:    Edwin [name] is a 43 y.o. male with a hx of hypertension, hyperlipidemia, tobacco use, alcohol use here to follow up in the [facility]. Family history notable for dad with ESRD and mom with heart failure, both had hypertension.   Family history notable for both his father and mother having hypertension.  Father progressed to ESRD on HD and mother progressed to heart failure.  Both of his parents were diagnosed with hypertension when they were young.   Was seen 07/2021 by primary care with elevated blood pressure 184/140 in the setting of noncompliance with medical therapy.  At follow-up 08/15/2021 BP 170/120 with antihypertensive regimen amlodipine  10 mg daily, hydrochlorothiazide  25 mg daily.  Hydrochlorothiazide  was transitioned to valsartan -hydrochlorothiazide .   Established with [facility] 01/11/22. Edwin [name] was diagnosed with hypertension in his 37s. Smoking 1 PPD and drinking 10 standard drinks most days. Did note that he uses alcohol as a coping mechanism as he lost both his mom and dad around the same time. Was not following low sodium diet. He was not routinely taking medications and importance of adherence discussed. Amlodipine  10mg  daily. OTC Prilosec recommended for GERD.   Admitted 10/16-10/17/23 with new onset atrial fibrillation with RVR. Treated with Diltiazem , OAC. Etoh felt to be trigger. Echo during admission LVEF 50-55%, no RWMA, mildly reduced RV function. Renal duplex 03/28/23 no RAS. Normal thyroid  function. Plan for 1 month of OAC post cardioversion given CHADS2VASc of 1. Recommended for outpatient sleep study. Discharged on  Cardizem  300mg  every day, Coreg  12.5mg  BID. He was not taking hydrochlorothiazide  nor ARB prior to admission.   Seen 01/18/23. Carvedilol  was switched Nebivolol  for easier once daily dosing. Stopped OAC as CHADS2VASc 1. He had inguinal hernia repair performed 03/08/23.   Last seen 03/21/23 with BP not at goal in setting of nonadherence. He was given list of AA meetings due to goal to reduce etoh use. ***  Presents today for follow up. He works third shift as a location manager and enjoys spending time with his 53 year old son. BP elevated, 160/110, today. Admits to taking medications sporadically since hernia repair, and has not taken them the last three days. Not monitoring BP at home. Drinking fifth of liquor daily and smoking 1 PPD. Desires to cut back on this. Has still been taking Eliquis  periodically but did note stopping it for surgery and resuming it two days later. Reports no shortness of breath nor dyspnea on exertion. Reports no chest pain, pressure, or tightness. No edema, orthopnea, PND. Reports no palpitations. ***  Previous antihypertensives: Hydrochlorothiazide  Valsartan  Losartan  Past Medical History:  Diagnosis Date   Alcohol abuse    Hypertension    Tobacco abuse     No past surgical history on file.  Current Medications: No outpatient medications have been marked as taking for the 06/13/23 encounter (Appointment) with [name], NP.     Allergies:   Patient has no known allergies.   Social History   Socioeconomic History   Marital status: Single    Spouse name: Not on file   Number of children: Not on file   Years of education: Not on file  Highest education level: Not on file  Occupational History   Not on file  Tobacco Use   Smoking status: Every Day    Current packs/day: 1.00    Types: Cigarettes   Smokeless tobacco: Never  Substance and Sexual Activity   Alcohol use: Yes    Alcohol/week: 15.0 standard drinks of alcohol    Types: 15 Shots of  liquor per week    Comment: one fifth gallon liquor every day   Drug use: No   Sexual activity: Not on file  Other Topics Concern   Not on file  Social History Narrative   Not on file   Social Drivers of Health   Financial Resource Strain: Low Risk  (01/31/2022)   Overall Financial Resource Strain (CARDIA)    Difficulty of Paying Living Expenses: Not very hard  Food Insecurity: No Food Insecurity (01/31/2022)   Hunger Vital Sign    Worried About Running Out of Food in the Last Year: Never true    Ran Out of Food in the Last Year: Never true  Transportation Needs: No Transportation Needs (01/31/2022)   PRAPARE - Administrator, Civil Service (Medical): No    Lack of Transportation (Non-Medical): No  Physical Activity: Not on file  Stress: Not on file  Social Connections: Not on file     Family History: The patient's family history includes Heart failure in his father and mother; Hypertension in his father and mother; Kidney disease in his father.  ROS:   Please see the history of present illness.     All other systems reviewed and are negative.  EKGs/Labs/Other Studies Reviewed:         Recent Labs: 03/06/2023: ALT 53; BUN 5; Creatinine, Ser 0.59; Potassium 3.8; Sodium 142   Recent Lipid Panel    Component Value Date/Time   CHOL 226 (H) 08/01/2021 1008   TRIG 101.0 08/01/2021 1008   HDL 78.00 08/01/2021 1008   CHOLHDL 3 08/01/2021 1008   VLDL 20.2 08/01/2021 1008   LDLCALC 128 (H) 08/01/2021 1008   LDLCALC 115 (H) 10/09/2019 1538    Physical Exam:   VS:  There were no vitals taken for this visit. , BMI There is no height or weight on file to calculate BMI. GENERAL:  Well appearing HEENT: Pupils equal round and reactive, fundi not visualized, oral mucosa unremarkable NECK:  No jugular venous distention, waveform within normal limits, carotid upstroke brisk and symmetric, no bruits, no thyromegaly LYMPHATICS:  No cervical adenopathy LUNGS:  Clear to  auscultation bilaterally HEART:  RRR.  PMI not displaced or sustained,S1 and S2 within normal limits, no S3, no S4, no clicks, no rubs, no murmurs ABD:  Flat, positive bowel sounds normal in frequency in pitch, no bruits, no rebound, no guarding, no midline pulsatile mass, no hepatomegaly, no splenomegaly EXT:  2 plus pulses throughout, no edema, no cyanosis no clubbing SKIN:  No rashes no nodules NEURO:  Cranial nerves II through XII grossly intact, motor grossly intact throughout PSYCH:  Cognitively intact, oriented to person place and time   ASSESSMENT/PLAN:    HTN -BP not at goal of less than 130/80.  Does endorse noncompliance with medications. Encouraged to restart taking diltiazem , nebivolol , and valsartan  daily. Will not adjust any medications due to noncompliance. Discussed to monitor BP a home at least 2 hours after medications and sitting for 5-10 minutes. If BP not at goal in future plan to further increase Valsartan .  PAF - Noted 03/2023, self  converted. Completed 3 weeks of OAC post cardioversion. Maintaining NSR today by auscultation. No recent palpitations. CHA2DS2-VASc Score =   [ ] .  Therefore, the patient's annual risk of stroke is   %. Given CHADSVasc only 1, Eliquis  was previously stopped. He did not stop this. Discussed again during office visit today. Discussed need to reduce etoh to reduce risk of recurrent PAF.   Tobacco use / ETOH use - 1 PPD and a fifth of liquor daily. Cessation encouraged. Previously given list of AA meetings/times in [place] and highlighted one near his home and one near his work. Did not attend any meetings, but is interested in finding some again. Again given list of current AA meetings and encouraged to attend when he gets off work in the mornings. Motivated to reduce etoh intake, thinks he will smoke less if he drinks less. Continue thiamine  supplementation.   Transaminitis - related to etoh. 03/06/23 AST 66, ALT 53 which is improved from  previous. Etoh cessation encouraged.  GERD - Continue Protonix ..  Anticipate heavy alcohol use contributory.  Screening for Secondary Hypertension:  Click here to document screening for secondary causes of HTN  :[id number]}     01/11/2022    1:44 PM 01/20/2023    7:34 PM  Causes  Drugs/Herbals Screened      - Comments 01/11/22 Etoh and tobacco use encouraged. Referred to pscyh, health coach   Renovascular HTN Screened Screened     - Comments 01/11/22 renal duplex ordered 03/2022 renal duplex no RAS  Sleep Apnea Screened      - Comments 01/11/22 does not snore   Thyroid  Disease Screened Screened     - Comments 01/11/22 TSH ordered 03/2022 normal TSH  Hyperaldosteronism Screened Screened     - Comments 01/11/22 renin-aldosterone ordered 01/2022 normal renin aldosterone  Cushing's Syndrome  N/A     - Comments  non-cushingoid appearance  Coarctation of the Aorta Screened      - Comments 01/11/22 BP symmetrical   Compliance Screened      - Comments 01/11/22 noted noncompliance     Relevant Labs/Studies:    Latest Ref Rng & Units 03/06/2023   10:51 AM 05/02/2022    3:57 PM 03/27/2022    2:04 AM  Basic Labs  Sodium 134 - 144 mmol/L 142  142  136   Potassium 3.5 - 5.2 mmol/L 3.8  3.1  3.8   Creatinine 0.76 - 1.27 mg/dL 9.40  9.36  9.33        Latest Ref Rng & Units 03/26/2022    7:16 PM 01/11/2022   11:08 AM  Thyroid    TSH 0.350 - 4.500 uIU/mL 2.005  0.547        Latest Ref Rng & Units 01/11/2022   11:08 AM  Renin/Aldosterone   Aldosterone 0.0 - 30.0 ng/dL <8.9   Renin 9.832 - 4.619 ng/mL/hr 0.590   Aldos/Renin Ratio 0.0 - 30.0 <1.7              03/27/2022    9:10 AM  Renovascular   Renal Artery US  Completed Yes      Disposition:    FU with MD/PharmD/APP in *** months.   Medication Adjustments/Labs and Tests Ordered: Current medicines are reviewed at length with the patient today.  Concerns regarding medicines are outlined above.  No orders of the defined types were placed in  this encounter.  No orders of the defined types were placed in this encounter.    Signed, Reche GORMAN Finder,  NP  06/12/2023 3:46 PM    [facility]

## 2023-06-13 ENCOUNTER — Encounter (HOSPITAL_BASED_OUTPATIENT_CLINIC_OR_DEPARTMENT_OTHER): Payer: 59 | Admitting: Family

## 2023-06-13 ENCOUNTER — Ambulatory Visit: Payer: 59 | Admitting: Family Medicine

## 2023-09-19 ENCOUNTER — Other Ambulatory Visit (HOSPITAL_COMMUNITY): Payer: Self-pay

## 2023-10-26 ENCOUNTER — Ambulatory Visit
Admission: EM | Admit: 2023-10-26 | Discharge: 2023-10-26 | Disposition: A | Discharge status: Home / Self Care | Payer: Self-pay | Attending: Family Medicine | Admitting: Family Medicine

## 2023-10-26 ENCOUNTER — Encounter: Payer: Self-pay | Admitting: Emergency Medicine

## 2023-10-26 DIAGNOSIS — Z8679 Personal history of other diseases of the circulatory system: Secondary | ICD-10-CM

## 2023-10-26 DIAGNOSIS — G5793 Unspecified mononeuropathy of bilateral lower limbs: Secondary | ICD-10-CM

## 2023-10-26 DIAGNOSIS — I10 Essential (primary) hypertension: Secondary | ICD-10-CM

## 2023-10-26 DIAGNOSIS — F1090 Alcohol use, unspecified, uncomplicated: Secondary | ICD-10-CM | POA: Diagnosis not present

## 2023-10-26 DIAGNOSIS — E538 Deficiency of other specified B group vitamins: Secondary | ICD-10-CM

## 2023-10-26 MED ORDER — B COMPLEX VITAMINS PO CAPS: 1.0000 | ORAL_CAPSULE | Freq: Every day | ORAL | 1 refills | Status: DC

## 2023-10-26 NOTE — ED Triage Notes (Addendum)
 Pt c/o numbness in both legs below the knee x 2 weeks. 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 Pt also c/o emesis x 3 days and fatigue x 1 week. Pt denies SOB but does c/o dizziness. He also states he has not had an appetite in 4 days.

## 2023-10-26 NOTE — ED Provider Notes (Signed)
 [facility]    CSN: [id number] Arrival date & time: 10/26/23  0803      History   Chief Complaint Chief Complaint  Patient presents with   Fatigue   Numbness   Emesis    HPI Edwin [name] is a 43 y.o. male.   Alcohol use disorder Patient with a greater than 25-year history of daily ingestion of alcohol mostly liquor presents today with concern over the last week of worsening fatigue, neuropathic pain in bilateral lower legs.  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  Patient has previously been prescribed B12 replacement however has not had the medication and has not been taking it consistently.  Patient reports that he has not ever been treated for alcohol dependence and would be interested in resources that could assist him with medication therapy to reduce his dependence on alcohol.  Patient works night shift and reports that he drinks most mornings prior to going to sleep.  He reports this has been ongoing activity for quite some time.  Medication noncompliance, Accelerated hypertension and hx Afib Patient has previously been followed by [facility] as patient was diagnosed with acute onset atrial fibrillation in 2023 thought to be related to uncontrolled hypertension and possible alcohol use.  Patient was last seen by cardiology a year ago and is currently not taking any of his cardiac medications as he fears mixing these with alcohol and reports some of the medications have run out.  On chart review patient was at some point prescribed Eliquis  but to take twice a day but reports he had a surgery last year and after the surgery he never resumed taking the Eliquis  and is uncertain as to whether or not he should be prescribed Eliquis .  Patient has no upcoming follow-up with cardiology or primary care. Patient without a  primary care provider as he missed scheduled appointments for January.  Patient's cardiac medications are prescribed through heart care he also missed the appointment scheduled for his cardiac follow-up in January.  Patient is wishes to reestablish with primary care.  Past Medical History:  Diagnosis Date   Alcohol abuse    Hypertension    Tobacco abuse     Patient Active Problem List   Diagnosis Date Noted   Nonadherence to medical treatment 11/28/2022   Left inguinal hernia 11/28/2022   Atrial fibrillation (HCC) 03/26/2022   Hyperlipidemia 07/06/2020   Vitamin D  deficiency 10/13/2019   Essential hypertension    Tobacco abuse    ETOH abuse     Past Surgical History:  Procedure Laterality Date   HERNIA REPAIR         Home Medications    Prior to Admission medications   Medication Sig Start Date End Date Taking? Authorizing Provider  b complex vitamins capsule Take 1 capsule by mouth daily. 10/26/23   Buena Carmine, NP  diltiazem  (CARDIZEM  CD) 300 MG 24 hr capsule Take 1 capsule (300 mg total) by mouth daily. 01/18/23   Walker, Caitlin S, NP  HYDROcodone -acetaminophen  (NORCO/VICODIN) 5-325 MG tablet Take 1 tablet by mouth every 6 (six) hours as needed. 03/08/23   [provider]  nebivolol  (BYSTOLIC ) 10 MG tablet Take 1 tablet (10 mg total) by mouth daily. 01/18/23   Clearnce Curia, NP  pantoprazole  (PROTONIX ) 40 MG tablet Take 1 tablet (40 mg  total) by mouth daily. 01/18/23   Clearnce Curia, NP  thiamine  (VITAMIN B1) 100 MG tablet Take 1 tablet (100 mg total) by mouth daily. Patient not taking: Reported on 03/21/2023 05/02/22   Arnie Bibber, MD  valsartan  (DIOVAN ) 80 MG tablet Take 1 tablet (80 mg total) by mouth daily. 03/06/23   Joaquin Mulberry, MD    Family History Family History  Problem Relation Age of Onset   Heart failure Mother    Hypertension Mother    Kidney disease Father    Heart failure Father    Hypertension Father     Social  History Social History   Tobacco Use   Smoking status: Every Day    Current packs/day: 1.00    Types: Cigarettes    Passive exposure: Past   Smokeless tobacco: Never  Vaping Use   Vaping status: Never Used  Substance Use Topics   Alcohol use: Yes    Alcohol/week: 15.0 standard drinks of alcohol    Types: 15 Shots of liquor per week    Comment: one fifth gallon liquor every day   Drug use: No     Allergies   Patient has no known allergies.   Review of Systems Review of Systems  Gastrointestinal:  Positive for vomiting.     Physical Exam Triage Vital Signs ED Triage Vitals  Encounter Vitals Group     BP 10/26/23 0832 (!) 154/107     Systolic BP Percentile --      Diastolic BP Percentile --      Pulse Rate 10/26/23 0832 88     Resp 10/26/23 0832 18     Temp 10/26/23 0832 98.7 F (37.1 C)     Temp Source 10/26/23 0832 Oral     SpO2 10/26/23 0832 98 %     Weight 10/26/23 0831 205 lb 14.6 oz (93.4 kg)     Height --      Head Circumference --      Peak Flow --      Pain Score 10/26/23 0831 0     Pain Loc --      Pain Education --      Exclude from Growth Chart --    No data found.  Updated Vital Signs BP (!) 154/107 (BP Location: Left Arm) Comment: Pt says he takes meds to maintain but hasn't taken them lately because he drinks.  Pulse 88   Temp 98.7 F (37.1 C) (Oral)   Resp 18   Wt 205 lb 14.6 oz (93.4 kg)   SpO2 98%   BMI 28.72 kg/m   Visual Acuity Right Eye Distance:   Left Eye Distance:   Bilateral Distance:    Right Eye Near:   Left Eye Near:    Bilateral Near:     Physical Exam Vitals reviewed.  Constitutional:      Appearance: Normal appearance.  HENT:     Head: Normocephalic and atraumatic.  Eyes:     Extraocular Movements: Extraocular movements intact.     Pupils: Pupils are equal, round, and reactive to light.  Cardiovascular:     Rate and Rhythm: Normal rate and regular rhythm.  Pulmonary:     Effort: Pulmonary effort is  normal.     Breath sounds: Normal breath sounds.  Musculoskeletal:        General: Normal range of motion.     Cervical back: Normal range of motion.  Neurological:     General: No focal deficit present.  Mental Status: He is alert and oriented to person, place, and time.     Cranial Nerves: No cranial nerve deficit.     Motor: No weakness.     Coordination: Coordination normal.      UC Treatments / Results  Labs (all labs ordered are listed, but only abnormal results are displayed) Labs Reviewed - No data to display  EKG   Radiology No results found.  Procedures Procedures (including critical care time)  Medications Ordered in UC Medications - No data to display  Initial Impression / Assessment and Plan / UC Course  I have reviewed the triage vital signs and the nursing notes.  Pertinent labs & imaging results that were available during my care of the patient were reviewed by me and considered in my medical decision making (see chart for details).    Had a serious discussion with patient regarding his overall health if he continues to miss manage heart medications, continues to abuse alcohol and continues to have poor follow-up with heart specialist and primary care.  Patient was established with a new primary care provider during this encounter.  Patient was also given the new contact information for heart care as they have relocated to a new building given the phone number to call their office on Monday to schedule a follow-up appointment as all of his heart medications are prescribed through their office.  Explained to patient that most primary care is will not assume care of medications prescribed for cardiovascular conditions and that have been initiated by a cardiologist.  Explained to patient is very important that he reaches out to heart care office on Monday so that he can be evaluated and restarted on medication and possibly restarted back on Eliquis  if this is  needed.  Obtain an EKG today patient is not in atrial fibrillation and EKG is similar to previous EKG readings indicating LVH, most likely secondary to poor medication compliance with hypertension medications and uncontrolled hypertension.  Patient also explains some neuropathic symptoms related to lower left paresthesias and pins and needle pain explained to patient this is also a late negative outcome to consuming large amounts of alcohol for prolonged period of time.  Encourage patient to begin taking an over-the-counter multivitamin and I represcribed be complex vitamin as he likely has a B12 deficiency given his symptoms.  Patient was given contact information to establish with [facility] which has an online medication assisted alcohol treatment program which can be facilitated around patient's birth hours.  Patient was appreciative and advised that he understands that he will need to follow-up with heart care and keep currently scheduled primary care appointment for ongoing management of his chronic conditions.  No acute findings seen on exam today that would warrant emergent evaluation although patient advised that if any red flag symptoms to develop to go to the nearest emergency department. Final Clinical Impressions(s) / UC Diagnosis   Final diagnoses:  Uncontrolled hypertension  History of atrial fibrillation  Alcohol use disorder  Neuropathy involving both lower extremities  B12 deficiency     Discharge Instructions      You will need to schedule an appointment with your primary care doctor and cardiologist.   Inappropriately taking your blood pressure medications and not sure alcohol is likely contributing to your fatigue symptoms of leg neuropathy which which are known to be associated with iron and B12 deficiency related to alcohol use. I have provided you a handout for virtual company that can get you set  up with the medication I talked about naltrexone which is prescribed to  reduce cravings associated with large consumptions of alcohol and help you identify some reasons as to why you are drinking.  Drinking will have adverse effects on your cardiac history so it is very important to reduce your consumption of alcohol and to resume taking your heart medications.   ED Prescriptions     Medication Sig Dispense Auth. Provider   b complex vitamins capsule Take 1 capsule by mouth daily. 90 capsule Buena Carmine, NP      PDMP not reviewed this encounter.   Buena Carmine, NP 10/27/23 2144

## 2023-10-26 NOTE — Discharge Instructions (Addendum)
 You will need to schedule an appointment with your primary care doctor and cardiologist.   Inappropriately taking your blood pressure medications and not sure alcohol is likely contributing to your fatigue symptoms of leg neuropathy which which are known to be associated with iron and B12 deficiency related to alcohol use. I have provided you a handout for virtual company that can get you set up with the medication I talked about naltrexone which is prescribed to reduce cravings associated with large consumptions of alcohol and help you identify some reasons as to why you are drinking.  Drinking will have adverse effects on your cardiac history so it is very important to reduce your consumption of alcohol and to resume taking your heart medications.

## 2023-12-12 ENCOUNTER — Ambulatory Visit: Payer: Self-pay | Admitting: Internal Medicine

## 2024-01-20 NOTE — Progress Notes (Deleted)
 [facility]ARE [facility] [address] [place] [address] Dept: [phone] Dept Fax: [phone]  New Patient Office Visit  Subjective:   Edwin [name] March 16, 1981 01/21/2024  No chief complaint on file.   HPI: Edwin [name] presents today to establish care at [facility]. Introduced to Publishing rights manager role and practice setting.  All questions answered.  Concerns: See below     The following portions of the patient's history were reviewed and updated as appropriate: past medical history, past surgical history, family history, social history, allergies, medications, and problem list.   Patient Active Problem List   Diagnosis Date Noted   Nonadherence to medical treatment 11/28/2022   Left inguinal hernia 11/28/2022   Atrial fibrillation (HCC) 03/26/2022   Hyperlipidemia 07/06/2020   Vitamin D  deficiency 10/13/2019   Essential hypertension    Tobacco abuse    ETOH abuse    Past Medical History:  Diagnosis Date   Alcohol abuse    Hypertension    Tobacco abuse    Past Surgical History:  Procedure Laterality Date   HERNIA REPAIR     Family History  Problem Relation Age of Onset   Heart failure Mother    Hypertension Mother    Kidney disease Father    Heart failure Father    Hypertension Father     Current Outpatient Medications:    b complex vitamins capsule, Take 1 capsule by mouth daily., Disp: 90 capsule, Rfl: 1   diltiazem  (CARDIZEM  CD) 300 MG 24 hr capsule, Take 1 capsule (300 mg total) by mouth daily., Disp: 90 capsule, Rfl: 3   HYDROcodone -acetaminophen  (NORCO/VICODIN) 5-325 MG tablet, Take 1 tablet by mouth every 6 (six) hours as needed., Disp: , Rfl:    nebivolol  (BYSTOLIC ) 10 MG tablet, Take 1 tablet (10 mg total) by mouth daily., Disp: 30 tablet, Rfl: 5   pantoprazole  (PROTONIX ) 40 MG tablet, Take 1 tablet (40 mg total) by mouth daily., Disp: 90 tablet, Rfl: 1    thiamine  (VITAMIN B1) 100 MG tablet, Take 1 tablet (100 mg total) by mouth daily. (Patient not taking: Reported on 03/21/2023), Disp: 30 tablet, Rfl: 0   valsartan  (DIOVAN ) 80 MG tablet, Take 1 tablet (80 mg total) by mouth daily., Disp: 90 tablet, Rfl: 0 No Known Allergies  ROS: A complete ROS was performed with pertinent positives/negatives noted in the HPI. The remainder of the ROS are negative.   Objective:   There were no vitals filed for this visit.  GENERAL: Well-appearing, in NAD. Well nourished.  SKIN: Pink, warm and dry. No rash, lesion, ulceration, or ecchymoses.  NECK: Trachea midline. Full ROM w/o pain or tenderness. No lymphadenopathy.  RESPIRATORY: Chest wall symmetrical. Respirations even and non-labored. Breath sounds clear to auscultation bilaterally.  CARDIAC: S1, S2 present, regular rate and rhythm. Peripheral pulses 2+ bilaterally.  MSK: Muscle tone and strength appropriate for age. Joints w/o tenderness, redness, or swelling.  EXTREMITIES: Without clubbing, cyanosis, or edema.  NEUROLOGIC: No motor or sensory deficits. Steady, even gait.  PSYCH/MENTAL STATUS: Alert, oriented x 3. Cooperative, appropriate mood and affect.   Health Maintenance Due  Topic Date Due   Pneumococcal Vaccine: 19-49 Years (1 of 2 - PCV) Never done   Hepatitis B Vaccines (1 of 3 - 19+ 3-dose series) Never done   HPV VACCINES (1 - 3-dose SCDM series) Never done   COVID-19 Vaccine (1 - 2024-25 season) Never done   INFLUENZA VACCINE  01/10/2024  No results found for any visits on 01/21/24.  Assessment & Plan:   No orders of the defined types were placed in this encounter.  No orders of the defined types were placed in this encounter.   No follow-ups on file.   Rosina Senters, FNP

## 2024-01-21 ENCOUNTER — Ambulatory Visit: Admitting: Internal Medicine

## 2024-01-21 DIAGNOSIS — E559 Vitamin D deficiency, unspecified: Secondary | ICD-10-CM

## 2024-01-21 DIAGNOSIS — F1010 Alcohol abuse, uncomplicated: Secondary | ICD-10-CM

## 2024-01-21 DIAGNOSIS — E782 Mixed hyperlipidemia: Secondary | ICD-10-CM

## 2024-01-21 DIAGNOSIS — I480 Paroxysmal atrial fibrillation: Secondary | ICD-10-CM

## 2024-01-21 DIAGNOSIS — Z720 Tobacco use: Secondary | ICD-10-CM

## 2024-01-21 DIAGNOSIS — I10 Essential (primary) hypertension: Secondary | ICD-10-CM

## 2024-05-26 ENCOUNTER — Encounter: Admitting: Family Medicine

## 2024-05-26 ENCOUNTER — Ambulatory Visit
Admission: EM | Admit: 2024-05-26 | Discharge: 2024-05-26 | Disposition: A | Discharge status: Home / Self Care | Attending: Family Medicine | Admitting: Family Medicine

## 2024-05-26 DIAGNOSIS — K219 Gastro-esophageal reflux disease without esophagitis: Secondary | ICD-10-CM

## 2024-05-26 DIAGNOSIS — M79604 Pain in right leg: Secondary | ICD-10-CM

## 2024-05-26 DIAGNOSIS — I10 Essential (primary) hypertension: Secondary | ICD-10-CM

## 2024-05-26 DIAGNOSIS — M79605 Pain in left leg: Secondary | ICD-10-CM

## 2024-05-26 DIAGNOSIS — I480 Paroxysmal atrial fibrillation: Secondary | ICD-10-CM

## 2024-05-26 MED ORDER — NEBIVOLOL HCL 10 MG PO TABS: 10.0000 mg | ORAL_TABLET | Freq: Every day | ORAL | 2 refills | Status: AC

## 2024-05-26 MED ORDER — VALSARTAN 80 MG PO TABS: 80.0000 mg | ORAL_TABLET | Freq: Every day | ORAL | 2 refills | Status: DC

## 2024-05-26 NOTE — Discharge Instructions (Signed)
 We have drawn blood to check blood counts, electrolytes, sugar and kidney and liver function.  Staff will notify you if there is anything significantly abnormal  Bystolic /Nebivolol  10 mg--1 daily for blood pressure  Valsartan  80 mg--1 daily for blood pressure  Please follow-up with primary care  Drink more fluids

## 2024-05-26 NOTE — ED Triage Notes (Signed)
 Pt present elevated blood pressure with heaviness in his legs. The heaviness started from Knee and downwards. Symptoms start over a month ago.

## 2024-05-26 NOTE — ED Provider Notes (Signed)
 [facility]    CSN: [id number] Arrival date & time: 05/26/24  1028      History   Chief Complaint Chief Complaint  Patient presents with   Hypertension    HPI Edwin [name] is a 43 y.o. male.    Hypertension  Here for some bilateral leg discomfort and numbness and tingling, going on for a while.  Possibly has been going on for at least 3 months.  He also has some occasional discomfort in his right shoulder.  No headache.  Sometimes the right shoulder pain is on his right chest, but he notes that is being light.  No fever or chills, but maybe a little cough in the last few days.  No shortness of breath  He has felt dizzy and has been sent home from work.  No fall.  No syncope.  He does have a history of hypertension and of atrial fibrillation.  He states he has not been taking his Eliquis  since he had his surgery.  He finds it difficult to get into his MyChart and missed the primary care appointment that he had set.  That office that he missed the appointment from today rescheduled him for April and he will not be able to get in with them any sooner.  NKDA  He does drink a good bit of alcohol daily.  Past Medical History:  Diagnosis Date   Alcohol abuse    Hypertension    Tobacco abuse     Patient Active Problem List   Diagnosis Date Noted   Nonadherence to medical treatment 11/28/2022   Left inguinal hernia 11/28/2022   Atrial fibrillation (HCC) 03/26/2022   Hyperlipidemia 07/06/2020   Vitamin D  deficiency 10/13/2019   Essential hypertension    Tobacco abuse    ETOH abuse     Past Surgical History:  Procedure Laterality Date   HERNIA REPAIR         Home Medications    Prior to Admission medications  Medication Sig Start Date End Date Taking? Authorizing Provider  b complex vitamins capsule Take 1 capsule by mouth daily. 10/26/23   Arloa Suzen RAMAN, NP  nebivolol  (BYSTOLIC ) 10 MG tablet Take 1 tablet (10 mg total) by mouth  daily. 05/26/24   [name], MD  pantoprazole  (PROTONIX ) 40 MG tablet Take 1 tablet (40 mg total) by mouth daily. 01/18/23   [name], NP  thiamine  (VITAMIN B1) 100 MG tablet Take 1 tablet (100 mg total) by mouth daily. Patient not taking: Reported on 03/21/2023 05/02/22   [name], MD  valsartan  (DIOVAN ) 80 MG tablet Take 1 tablet (80 mg total) by mouth daily. 05/26/24   [name], MD    Family History Family History  Problem Relation Age of Onset   Heart failure Mother    Hypertension Mother    Kidney disease Father    Heart failure Father    Hypertension Father     Social History Social History[1]   Allergies   Patient has no known allergies.   Review of Systems Review of Systems   Physical Exam Triage Vital Signs ED Triage Vitals [05/26/24 1154]  Encounter Vitals Group     BP (!) 158/104     Girls Systolic BP Percentile      Girls Diastolic BP Percentile      Boys Systolic BP Percentile      Boys Diastolic BP Percentile      Pulse Rate 88  Resp 18     Temp 98.3 F (36.8 C)     Temp Source Oral     SpO2 95 %     Weight      Height      Head Circumference      Peak Flow      Pain Score 0     Pain Loc      Pain Education      Exclude from Growth Chart    No data found.  Updated Vital Signs BP (!) 158/104 (BP Location: Left Arm)   Pulse 88   Temp 98.3 F (36.8 C) (Oral)   Resp 18   SpO2 95%   Visual Acuity Right Eye Distance:   Left Eye Distance:   Bilateral Distance:    Right Eye Near:   Left Eye Near:    Bilateral Near:     Physical Exam Vitals reviewed.  Constitutional:      General: He is not in acute distress.    Appearance: He is not ill-appearing, toxic-appearing or diaphoretic.  HENT:     Mouth/Throat:     Mouth: Mucous membranes are moist.  Eyes:     Extraocular Movements: Extraocular movements intact.     Conjunctiva/sclera: Conjunctivae normal.     Pupils: Pupils are equal, round, and  reactive to light.  Cardiovascular:     Rate and Rhythm: Normal rate and regular rhythm.     Heart sounds: No murmur heard. Pulmonary:     Effort: Pulmonary effort is normal. No respiratory distress.     Breath sounds: Normal breath sounds. No stridor. No rhonchi or rales.  Musculoskeletal:     Cervical back: Neck supple.     Right lower leg: No edema.     Left lower leg: No edema.  Lymphadenopathy:     Cervical: No cervical adenopathy.  Skin:    Coloration: Skin is not jaundiced or pale.  Neurological:     General: No focal deficit present.     Mental Status: He is alert and oriented to person, place, and time.  Psychiatric:        Behavior: Behavior normal.      UC Treatments / Results  Labs (all labs ordered are listed, but only abnormal results are displayed) Labs Reviewed  CBC  COMPREHENSIVE METABOLIC PANEL WITH GFR    EKG   Radiology No results found.  Procedures Procedures (including critical care time)  Medications Ordered in UC Medications - No data to display  Initial Impression / Assessment and Plan / UC Course  I have reviewed the triage vital signs and the nursing notes.  Pertinent labs & imaging results that were available during my care of the patient were reviewed by me and considered in my medical decision making (see chart for details).     CBC and CMP are drawn today to assess his dizziness. I do wonder if he is developing some neuropathy, possibly from his heavy alcohol intake.  Prescriptions are sent to restart his Bystolic  and valsartan .  I did not send the diltiazem , and discussed with him why.  His exam is consistent with being in sinus rhythm.  I am not going to start anticoagulation at this point.  Staff will help him establish with primary care, hopefully next-door. Final Clinical Impressions(s) / UC Diagnoses   Final diagnoses:  Essential hypertension  Bilateral leg pain     Discharge Instructions      We have drawn  blood to check  blood counts, electrolytes, sugar and kidney and liver function.  Staff will notify you if there is anything significantly abnormal  Bystolic /Nebivolol  10 mg--1 daily for blood pressure  Valsartan  80 mg--1 daily for blood pressure  Please follow-up with primary care  Drink more fluids       ED Prescriptions     Medication Sig Dispense Auth. Provider   nebivolol  (BYSTOLIC ) 10 MG tablet Take 1 tablet (10 mg total) by mouth daily. 30 tablet [name], MD   valsartan  (DIOVAN ) 80 MG tablet Take 1 tablet (80 mg total) by mouth daily. 30 tablet Oluwatobi Ruppe, Sharlet POUR, MD      I have reviewed the PDMP during this encounter.    [1]  Social History Tobacco Use   Smoking status: Every Day    Current packs/day: 1.00    Types: Cigarettes    Passive exposure: Past   Smokeless tobacco: Never  Vaping Use   Vaping status: Never Used  Substance Use Topics   Alcohol use: Yes    Alcohol/week: 15.0 standard drinks of alcohol    Types: 15 Shots of liquor per week    Comment: one fifth gallon liquor every day   Drug use: No     [name], MD 05/26/24 1222

## 2024-05-27 ENCOUNTER — Ambulatory Visit: Payer: Self-pay | Admitting: Family Medicine

## 2024-05-27 LAB — CBC
Hematocrit: 43 % (ref 37.5–51.0)
Hemoglobin: 14.4 g/dL (ref 13.0–17.7)
MCH: 34 pg — ABNORMAL HIGH (ref 26.6–33.0)
MCHC: 33.5 g/dL (ref 31.5–35.7)
MCV: 101 fL — ABNORMAL HIGH (ref 79–97)
Platelets: 190 x10E3/uL (ref 150–450)
RBC: 4.24 x10E6/uL (ref 4.14–5.80)
RDW: 12.7 % (ref 11.6–15.4)
WBC: 8.3 x10E3/uL (ref 3.4–10.8)

## 2024-05-27 LAB — COMPREHENSIVE METABOLIC PANEL WITH GFR
ALT: 56 IU/L — ABNORMAL HIGH (ref 0–44)
AST: 145 IU/L — ABNORMAL HIGH (ref 0–40)
Albumin: 4.6 g/dL (ref 4.1–5.1)
Alkaline Phosphatase: 73 IU/L (ref 47–123)
BUN/Creatinine Ratio: 11 (ref 9–20)
BUN: 6 mg/dL (ref 6–24)
Bilirubin Total: 0.5 mg/dL (ref 0.0–1.2)
CO2: 24 mmol/L (ref 20–29)
Calcium: 9.9 mg/dL (ref 8.7–10.2)
Chloride: 103 mmol/L (ref 96–106)
Creatinine, Ser: 0.57 mg/dL — ABNORMAL LOW (ref 0.76–1.27)
Globulin, Total: 2.7 g/dL (ref 1.5–4.5)
Glucose: 79 mg/dL (ref 70–99)
Potassium: 3.6 mmol/L (ref 3.5–5.2)
Sodium: 145 mmol/L — ABNORMAL HIGH (ref 134–144)
Total Protein: 7.3 g/dL (ref 6.0–8.5)
eGFR: 126 mL/min/1.73 (ref >59)

## 2024-06-05 ENCOUNTER — Encounter (HOSPITAL_COMMUNITY): Payer: Self-pay

## 2024-06-05 ENCOUNTER — Other Ambulatory Visit: Payer: Self-pay

## 2024-06-05 ENCOUNTER — Emergency Department (HOSPITAL_COMMUNITY)
Admission: EM | Admit: 2024-06-05 | Discharge: 2024-06-06 | Discharge status: Against Medical Advice | Attending: Emergency Medicine | Admitting: Emergency Medicine

## 2024-06-05 DIAGNOSIS — Z5321 Procedure and treatment not carried out due to patient leaving prior to being seen by health care provider: Secondary | ICD-10-CM | POA: Insufficient documentation

## 2024-06-05 DIAGNOSIS — R4182 Altered mental status, unspecified: Secondary | ICD-10-CM | POA: Diagnosis present

## 2024-06-05 LAB — CBC
HCT: 40.3 % (ref 39.0–52.0)
Hemoglobin: 13.8 g/dL (ref 13.0–17.0)
MCH: 34.1 pg — ABNORMAL HIGH (ref 26.0–34.0)
MCHC: 34.2 g/dL (ref 30.0–36.0)
MCV: 99.5 fL (ref 80.0–100.0)
Platelets: 145 K/uL — ABNORMAL LOW (ref 150–400)
RBC: 4.05 MIL/uL — ABNORMAL LOW (ref 4.22–5.81)
RDW: 14.8 % (ref 11.5–15.5)
WBC: 7.2 K/uL (ref 4.0–10.5)
nRBC: 0 % (ref 0.0–0.2)

## 2024-06-05 NOTE — ED Triage Notes (Signed)
 Dizziness, heaviness in both legs, and trouble hearing for a week. He also said that he is having some shortness of breath and some chest pressure. Admitted to having two drinks of alcohol.

## 2024-06-05 NOTE — ED Provider Triage Note (Signed)
 Emergency Medicine Provider Triage Evaluation Note  Edwin [name] , a 43 y.o. male  was evaluated in triage.  Pt complains of heaviness in both his legs from the knees down, nasal congestion/ear congestion for a week, generalized fatigue.  Episode of chest pressure associate with shortness of breath that resolved.  Review of Systems  Positive: Episode of chest pain, nasal congestion, heaviness in lower extremities Negative: Fever, vomiting, diarrhea  Physical Exam  BP (!) 146/99 (BP Location: Right Arm)   Pulse (!) 103   Temp 98.4 F (36.9 C) (Oral)   Resp 18   Ht 5' 11 (1.803 m)   Wt 93 kg   SpO2 96%   BMI 28.59 kg/m  Gen:   Awake, no distress   Resp:  Normal effort  MSK:   Moves extremities without difficulty    Medical Decision Making  Medically screening exam initiated at 11:54 PM.  Appropriate orders placed.  Edwin [name] was informed that the remainder of the evaluation will be completed by another provider, this initial triage assessment does not replace that evaluation, and the importance of remaining in the ED until their evaluation is complete.  43 year old male presents emergency department with heaviness in both his legs, generalized malaise, nasal/ear congestion as well as a resolved episode of chest pain/shortness of breath.  EKG is unchanged.  Patient evaluated sitting up in chair, fully clothed, limited PE. Orders placed. Patient was counseled that they need to remain in the ED until the completion of their work-up including a full H&P, additional testing and results of any tests.  The patient appears stable and the remainder of the encounter may be completed by another provider.   Bari Roxie HERO, DO 06/05/24 2355

## 2024-06-06 LAB — COMPREHENSIVE METABOLIC PANEL WITH GFR
ALT: 62 U/L — ABNORMAL HIGH (ref 0–44)
AST: 122 U/L — ABNORMAL HIGH (ref 15–41)
Albumin: 4.1 g/dL (ref 3.5–5.0)
Alkaline Phosphatase: 72 U/L (ref 38–126)
Anion gap: 20 — ABNORMAL HIGH (ref 5–15)
BUN: 9 mg/dL (ref 6–20)
CO2: 22 mmol/L (ref 22–32)
Calcium: 9.3 mg/dL (ref 8.9–10.3)
Chloride: 102 mmol/L (ref 98–111)
Creatinine, Ser: 0.65 mg/dL (ref 0.61–1.24)
GFR, Estimated: >60 mL/min
Glucose, Bld: 135 mg/dL — ABNORMAL HIGH (ref 70–99)
Potassium: 3.7 mmol/L (ref 3.5–5.1)
Sodium: 144 mmol/L (ref 135–145)
Total Bilirubin: 0.4 mg/dL (ref 0.0–1.2)
Total Protein: 7.2 g/dL (ref 6.5–8.1)

## 2024-06-06 LAB — URINALYSIS, ROUTINE W REFLEX MICROSCOPIC
Bacteria, UA: NONE SEEN
Bilirubin Urine: NEGATIVE
Glucose, UA: NEGATIVE mg/dL
Hgb urine dipstick: NEGATIVE
Ketones, ur: 20 mg/dL — AB
Leukocytes,Ua: NEGATIVE
Nitrite: NEGATIVE
Protein, ur: 30 mg/dL — AB
Specific Gravity, Urine: 1.024 (ref 1.005–1.030)
pH: 5 (ref 5.0–8.0)

## 2024-06-06 LAB — RESP PANEL BY RT-PCR (RSV, FLU A&B, COVID)  RVPGX2
Influenza A by PCR: NEGATIVE
Influenza B by PCR: NEGATIVE
Resp Syncytial Virus by PCR: NEGATIVE
SARS Coronavirus 2 by RT PCR: NEGATIVE

## 2024-06-12 ENCOUNTER — Inpatient Hospital Stay (HOSPITAL_COMMUNITY)
Admission: EM | Admit: 2024-06-12 | Discharge: 2024-06-15 | DRG: 093 | Disposition: A | Discharge status: Home / Self Care | Attending: Family Medicine | Admitting: Family Medicine

## 2024-06-12 ENCOUNTER — Other Ambulatory Visit: Payer: Self-pay

## 2024-06-12 ENCOUNTER — Emergency Department (HOSPITAL_COMMUNITY)

## 2024-06-12 DIAGNOSIS — Z8249 Family history of ischemic heart disease and other diseases of the circulatory system: Secondary | ICD-10-CM

## 2024-06-12 DIAGNOSIS — Y908 Blood alcohol level of 240 mg/100 ml or more: Secondary | ICD-10-CM | POA: Diagnosis present

## 2024-06-12 DIAGNOSIS — I10 Essential (primary) hypertension: Secondary | ICD-10-CM | POA: Diagnosis present

## 2024-06-12 DIAGNOSIS — F1010 Alcohol abuse, uncomplicated: Secondary | ICD-10-CM | POA: Diagnosis present

## 2024-06-12 DIAGNOSIS — E876 Hypokalemia: Secondary | ICD-10-CM | POA: Diagnosis present

## 2024-06-12 DIAGNOSIS — Z79899 Other long term (current) drug therapy: Secondary | ICD-10-CM

## 2024-06-12 DIAGNOSIS — F17210 Nicotine dependence, cigarettes, uncomplicated: Secondary | ICD-10-CM | POA: Diagnosis present

## 2024-06-12 DIAGNOSIS — F32A Depression, unspecified: Secondary | ICD-10-CM | POA: Diagnosis present

## 2024-06-12 DIAGNOSIS — D696 Thrombocytopenia, unspecified: Secondary | ICD-10-CM | POA: Diagnosis present

## 2024-06-12 DIAGNOSIS — R29898 Other symptoms and signs involving the musculoskeletal system: Secondary | ICD-10-CM

## 2024-06-12 DIAGNOSIS — R0989 Other specified symptoms and signs involving the circulatory and respiratory systems: Secondary | ICD-10-CM | POA: Diagnosis present

## 2024-06-12 DIAGNOSIS — R202 Paresthesia of skin: Principal | ICD-10-CM | POA: Diagnosis present

## 2024-06-12 DIAGNOSIS — R200 Anesthesia of skin: Secondary | ICD-10-CM | POA: Diagnosis present

## 2024-06-12 DIAGNOSIS — R531 Weakness: Secondary | ICD-10-CM | POA: Diagnosis present

## 2024-06-12 LAB — CBC
HCT: 40.2 % (ref 39.0–52.0)
Hemoglobin: 13.7 g/dL (ref 13.0–17.0)
MCH: 33.3 pg (ref 26.0–34.0)
MCHC: 34.1 g/dL (ref 30.0–36.0)
MCV: 97.6 fL (ref 80.0–100.0)
Platelets: 99 K/uL — ABNORMAL LOW (ref 150–400)
RBC: 4.12 MIL/uL — ABNORMAL LOW (ref 4.22–5.81)
RDW: 14.7 % (ref 11.5–15.5)
WBC: 6.9 K/uL (ref 4.0–10.5)
nRBC: 0 % (ref 0.0–0.2)

## 2024-06-12 LAB — BASIC METABOLIC PANEL WITH GFR
Anion gap: 25 — ABNORMAL HIGH (ref 5–15)
BUN: 6 mg/dL (ref 6–20)
CO2: 21 mmol/L — ABNORMAL LOW (ref 22–32)
Calcium: 9.2 mg/dL (ref 8.9–10.3)
Chloride: 96 mmol/L — ABNORMAL LOW (ref 98–111)
Creatinine, Ser: 0.5 mg/dL — ABNORMAL LOW (ref 0.61–1.24)
GFR, Estimated: >60 mL/min
Glucose, Bld: 103 mg/dL — ABNORMAL HIGH (ref 70–99)
Potassium: 3 mmol/L — ABNORMAL LOW (ref 3.5–5.1)
Sodium: 143 mmol/L (ref 135–145)

## 2024-06-12 LAB — TROPONIN T, HIGH SENSITIVITY: Troponin T High Sensitivity: <15 ng/L (ref 0–19)

## 2024-06-12 NOTE — ED Triage Notes (Addendum)
 Patient came in with multiple complain. Patient report numbness on both legs and hands x 1 month. Patient report intermittent chest pain and SOB x 2 months. Patient report brown color urine. Patient report he had 2 cans of beer tonight.

## 2024-06-13 ENCOUNTER — Inpatient Hospital Stay (HOSPITAL_COMMUNITY)

## 2024-06-13 DIAGNOSIS — Z79899 Other long term (current) drug therapy: Secondary | ICD-10-CM | POA: Diagnosis not present

## 2024-06-13 DIAGNOSIS — A5211 Tabes dorsalis: Secondary | ICD-10-CM

## 2024-06-13 DIAGNOSIS — R0989 Other specified symptoms and signs involving the circulatory and respiratory systems: Secondary | ICD-10-CM | POA: Diagnosis present

## 2024-06-13 DIAGNOSIS — I10 Essential (primary) hypertension: Secondary | ICD-10-CM

## 2024-06-13 DIAGNOSIS — G6181 Chronic inflammatory demyelinating polyneuritis: Secondary | ICD-10-CM

## 2024-06-13 DIAGNOSIS — D696 Thrombocytopenia, unspecified: Secondary | ICD-10-CM | POA: Diagnosis present

## 2024-06-13 DIAGNOSIS — R202 Paresthesia of skin: Secondary | ICD-10-CM | POA: Diagnosis present

## 2024-06-13 DIAGNOSIS — G35D Multiple sclerosis, unspecified: Secondary | ICD-10-CM | POA: Diagnosis not present

## 2024-06-13 DIAGNOSIS — F17210 Nicotine dependence, cigarettes, uncomplicated: Secondary | ICD-10-CM | POA: Diagnosis present

## 2024-06-13 DIAGNOSIS — R29818 Other symptoms and signs involving the nervous system: Secondary | ICD-10-CM | POA: Diagnosis present

## 2024-06-13 DIAGNOSIS — F1010 Alcohol abuse, uncomplicated: Secondary | ICD-10-CM | POA: Diagnosis present

## 2024-06-13 DIAGNOSIS — R531 Weakness: Secondary | ICD-10-CM

## 2024-06-13 DIAGNOSIS — F1090 Alcohol use, unspecified, uncomplicated: Secondary | ICD-10-CM

## 2024-06-13 DIAGNOSIS — Z7401 Bed confinement status: Secondary | ICD-10-CM | POA: Diagnosis not present

## 2024-06-13 DIAGNOSIS — R200 Anesthesia of skin: Secondary | ICD-10-CM | POA: Diagnosis not present

## 2024-06-13 DIAGNOSIS — E876 Hypokalemia: Secondary | ICD-10-CM

## 2024-06-13 DIAGNOSIS — Y908 Blood alcohol level of 240 mg/100 ml or more: Secondary | ICD-10-CM | POA: Diagnosis present

## 2024-06-13 DIAGNOSIS — Z8249 Family history of ischemic heart disease and other diseases of the circulatory system: Secondary | ICD-10-CM | POA: Diagnosis not present

## 2024-06-13 DIAGNOSIS — F32A Depression, unspecified: Secondary | ICD-10-CM | POA: Diagnosis present

## 2024-06-13 DIAGNOSIS — R29898 Other symptoms and signs involving the musculoskeletal system: Secondary | ICD-10-CM | POA: Diagnosis present

## 2024-06-13 LAB — TROPONIN T, HIGH SENSITIVITY: Troponin T High Sensitivity: <15 ng/L (ref 0–19)

## 2024-06-13 LAB — VITAMIN B12: Vitamin B-12: 2976 pg/mL — ABNORMAL HIGH (ref 180–914)

## 2024-06-13 LAB — TSH: TSH: 0.475 u[IU]/mL (ref 0.350–4.500)

## 2024-06-13 LAB — ETHANOL: Alcohol, Ethyl (B): 330 mg/dL

## 2024-06-13 LAB — HEMOGLOBIN A1C
Hgb A1c MFr Bld: 4.7 % — ABNORMAL LOW (ref 4.8–5.6)
Mean Plasma Glucose: 88.19 mg/dL

## 2024-06-13 LAB — CK: Total CK: 188 U/L (ref 49–397)

## 2024-06-13 LAB — FOLATE: Folate: 11.9 ng/mL

## 2024-06-13 LAB — MAGNESIUM: Magnesium: 1.6 mg/dL — ABNORMAL LOW (ref 1.7–2.4)

## 2024-06-13 MED ORDER — THIAMINE HCL 100 MG/ML IJ SOLN
500.0000 mg | Freq: Three times a day (TID) | INTRAVENOUS | Status: AC
  Administered 2024-06-13 – 2024-06-14 (×6): 500 mg via INTRAVENOUS
  Filled 2024-06-13 (×7): qty 5
  Filled 2024-06-13: qty 500

## 2024-06-13 MED ORDER — LORAZEPAM 1 MG PO TABS: 1.0000 mg | ORAL_TABLET | ORAL | Status: DC | PRN

## 2024-06-13 MED ORDER — ONDANSETRON HCL 4 MG/2ML IJ SOLN
4.0000 mg | Freq: Four times a day (QID) | INTRAMUSCULAR | Status: DC | PRN
  Administered 2024-06-13: 4 mg via INTRAVENOUS
  Filled 2024-06-13: qty 2

## 2024-06-13 MED ORDER — ALBUTEROL SULFATE (2.5 MG/3ML) 0.083% IN NEBU: 2.5000 mg | INHALATION_SOLUTION | RESPIRATORY_TRACT | Status: DC | PRN

## 2024-06-13 MED ORDER — FOLIC ACID 1 MG PO TABS
1.0000 mg | ORAL_TABLET | Freq: Every day | ORAL | Status: DC
  Administered 2024-06-13 – 2024-06-15 (×3): 1 mg via ORAL
  Filled 2024-06-13 (×3): qty 1

## 2024-06-13 MED ORDER — ONDANSETRON HCL 4 MG PO TABS: 4.0000 mg | ORAL_TABLET | Freq: Four times a day (QID) | ORAL | Status: DC | PRN

## 2024-06-13 MED ORDER — THIAMINE HCL 100 MG/ML IJ SOLN
100.0000 mg | Freq: Every day | INTRAMUSCULAR | Status: DC
  Filled 2024-06-13: qty 1

## 2024-06-13 MED ORDER — HYDRALAZINE HCL 20 MG/ML IJ SOLN: 10.0000 mg | Freq: Four times a day (QID) | INTRAMUSCULAR | Status: DC | PRN

## 2024-06-13 MED ORDER — THIAMINE HCL 100 MG/ML IJ SOLN
250.0000 mg | Freq: Every day | INTRAVENOUS | Status: DC
  Administered 2024-06-15: 250 mg via INTRAVENOUS
  Filled 2024-06-13: qty 2.5

## 2024-06-13 MED ORDER — LORAZEPAM 1 MG PO TABS
2.0000 mg | ORAL_TABLET | Freq: Once | ORAL | Status: AC
  Administered 2024-06-13: 2 mg via ORAL
  Filled 2024-06-13: qty 2

## 2024-06-13 MED ORDER — POTASSIUM CHLORIDE CRYS ER 20 MEQ PO TBCR
40.0000 meq | EXTENDED_RELEASE_TABLET | Freq: Once | ORAL | Status: AC
  Administered 2024-06-13: 40 meq via ORAL
  Filled 2024-06-13: qty 2

## 2024-06-13 MED ORDER — GUAIFENESIN-DM 100-10 MG/5ML PO SYRP
5.0000 mL | ORAL_SOLUTION | ORAL | Status: DC | PRN
  Administered 2024-06-13: 5 mL via ORAL
  Filled 2024-06-13: qty 10

## 2024-06-13 MED ORDER — ACETAMINOPHEN 650 MG RE SUPP: 650.0000 mg | Freq: Four times a day (QID) | RECTAL | Status: DC | PRN

## 2024-06-13 MED ORDER — IRBESARTAN 150 MG PO TABS
75.0000 mg | ORAL_TABLET | Freq: Every day | ORAL | Status: DC
  Administered 2024-06-13 – 2024-06-15 (×3): 75 mg via ORAL
  Filled 2024-06-13 (×3): qty 1

## 2024-06-13 MED ORDER — ACETAMINOPHEN 325 MG PO TABS
650.0000 mg | ORAL_TABLET | Freq: Four times a day (QID) | ORAL | Status: DC | PRN
  Administered 2024-06-13: 650 mg via ORAL
  Filled 2024-06-13: qty 2

## 2024-06-13 MED ORDER — NEBIVOLOL HCL 10 MG PO TABS
10.0000 mg | ORAL_TABLET | Freq: Every day | ORAL | Status: DC
  Administered 2024-06-13 – 2024-06-15 (×3): 10 mg via ORAL
  Filled 2024-06-13 (×3): qty 1

## 2024-06-13 MED ORDER — B COMPLEX-C PO TABS
1.0000 | ORAL_TABLET | Freq: Every day | ORAL | Status: DC
  Administered 2024-06-13 – 2024-06-15 (×3): 1 via ORAL
  Filled 2024-06-13 (×3): qty 1

## 2024-06-13 MED ORDER — ENOXAPARIN SODIUM 40 MG/0.4ML IJ SOSY
40.0000 mg | PREFILLED_SYRINGE | INTRAMUSCULAR | Status: DC
  Administered 2024-06-13 – 2024-06-14 (×2): 40 mg via SUBCUTANEOUS
  Filled 2024-06-13 (×2): qty 0.4

## 2024-06-13 MED ORDER — LORAZEPAM 2 MG/ML IJ SOLN
1.0000 mg | INTRAMUSCULAR | Status: DC | PRN
  Administered 2024-06-13: 2 mg via INTRAVENOUS
  Administered 2024-06-13: 1 mg via INTRAVENOUS
  Filled 2024-06-13 (×2): qty 1

## 2024-06-13 NOTE — ED Notes (Signed)
 Pt too anxious to complete MRI scan. Mr notified

## 2024-06-13 NOTE — H&P (Addendum)
 " History and Physical  Edwin [name] FMW:[id number] DOB: November 15, 1980 DOA: 06/12/2024  PCP: Patient, No Pcp Per   Chief Complaint: Hand and feet numbness, weakness  HPI: Edwin [name] is a 44 y.o. male with medical history significant for hypertension and alcohol abuse being admitted to the hospital with several months of bilateral hand and feet numbness, generalized weakness and imbalance.  He tells me the symptoms started several years ago, maybe nearly a year ago.  He drinks alcohol daily and his last drink was yesterday morning before he came to the ER.  He denies any fevers, chills, nausea, vomiting, shortness of breath, syncope, headaches, blurry vision, double vision.  Denies any focal weakness, however he has constant subjective numbness and tingling in his bilateral hands and feet.  He does admit to some imbalance, but he feels like it is because his legs do not respond when he tries to walk.  Review of Systems: Please see HPI for pertinent positives and negatives. A complete 10 system review of systems are otherwise negative.  Past Medical History:  Diagnosis Date   Alcohol abuse    Hypertension    Tobacco abuse    Past Surgical History:  Procedure Laterality Date   HERNIA REPAIR     Social History:  reports that he has been smoking cigarettes. He has been exposed to tobacco smoke. He has never used smokeless tobacco. He reports current alcohol use of about 15.0 standard drinks of alcohol per week. He reports that he does not use drugs.  Allergies[1]  Family History  Problem Relation Age of Onset   Heart failure Mother    Hypertension Mother    Kidney disease Father    Heart failure Father    Hypertension Father      Prior to Admission medications  Medication Sig Start Date End Date Taking? Authorizing Provider  nebivolol  (BYSTOLIC ) 10 MG tablet Take 1 tablet (10 mg total) by mouth daily. 05/26/24  Yes [name], MD  valsartan  (DIOVAN ) 80 MG tablet Take  1 tablet (80 mg total) by mouth daily. 05/26/24  Yes Banister, Pamela K, MD  b complex vitamins capsule Take 1 capsule by mouth daily. Patient not taking: Reported on 06/13/2024 10/26/23   Arloa Suzen RAMAN, NP  pantoprazole  (PROTONIX ) 40 MG tablet Take 1 tablet (40 mg total) by mouth daily. Patient not taking: Reported on 06/13/2024 01/18/23   [name], NP  thiamine  (VITAMIN B1) 100 MG tablet Take 1 tablet (100 mg total) by mouth daily. Patient not taking: Reported on 03/21/2023 05/02/22   [name], MD    Physical Exam: BP (!) 155/89   Pulse 98   Temp 98.6 F (37 C) (Oral)   Resp 15   SpO2 96%  General:  Alert, oriented, calm, in no acute distress, resting comfortably on room air Cardiovascular: RRR, no murmurs or rubs, no peripheral edema  Respiratory: clear to auscultation bilaterally, no wheezes, no crackles  Abdomen: soft, nontender, nondistended, normal bowel tones heard  Skin: dry, no rashes  Musculoskeletal: no joint effusions, normal range of motion  Psychiatric: appropriate affect, normal speech  Neurologic: extraocular muscles intact, clear speech, moving all extremities with intact sensorium, with bilateral upper and lower extremity numbness.  He has global weakness, following commands but very weak grip strength, barely able to lift his legs off the bed.         Labs on Admission:  Basic Metabolic Panel: Recent Labs  Lab 06/12/24 2304  NA 143  K 3.0*  CL 96*  CO2 21*  GLUCOSE 103*  BUN 6  CREATININE 0.50*  CALCIUM 9.2   Liver Function Tests: No results for input(s): AST, ALT, ALKPHOS, BILITOT, PROT, ALBUMIN in the last 168 hours. No results for input(s): LIPASE, AMYLASE in the last 168 hours. No results for input(s): AMMONIA in the last 168 hours. CBC: Recent Labs  Lab 06/12/24 2304  WBC 6.9  HGB 13.7  HCT 40.2  MCV 97.6  PLT 99*   Cardiac Enzymes: No results for input(s): CKTOTAL, CKMB, CKMBINDEX, TROPONINI  in the last 168 hours. BNP (last 3 results) No results for input(s): BNP in the last 8760 hours.  ProBNP (last 3 results) No results for input(s): PROBNP in the last 8760 hours.  CBG: No results for input(s): GLUCAP in the last 168 hours.  Radiological Exams on Admission: DG Chest 2 View Result Date: 06/12/2024 EXAM: 2 VIEW(S) XRAY OF THE CHEST 06/12/2024 10:44:00 PM COMPARISON: Comparison with 05/02/2022. CLINICAL HISTORY: Numbness in both legs and hands for 1 month. Intermittent chest pain and shortness of breath for 2 months. FINDINGS: LUNGS AND PLEURA: Shallow inspiration. Lungs are clear. No pleural effusion. No pneumothorax. HEART AND MEDIASTINUM: Heart size and pulmonary vascularity are normal. Mediastinal contours appear intact. No acute abnormality of the cardiac and mediastinal silhouettes. BONES AND SOFT TISSUES: No acute osseous abnormality. IMPRESSION: 1. No acute cardiopulmonary pathology. Electronically signed by: Elsie Gravely MD 06/12/2024 11:34 PM EST RP Workstation: [id number]   Assessment/Plan Edwin [name] is a 44 y.o. male with medical history significant for hypertension and alcohol abuse being admitted to the hospital with several months of bilateral hand and feet numbness, generalized weakness and imbalance.   Global weakness, with extremity numbness and tingling-will need to rule out central nervous system pathology, no recent illness, no evidence of acute infection.  Could be neuropathy related to alcohol abuse, but he has marked weakness. -Inpatient admission to [facility] -Formal neurology evaluation -MRI brain and whole spine with and without contrast -Will check SPEP, CK, A1c  Alcohol abuse-last drink was the morning of 1/2 -Thiamine , folate, multivitamin -Ativan  per CIWA protocol  Abnormal LFT-due to alcohol abuse, will trend  Hypertension-continue home ARB and Nebivolol   Hypokalemia-replete orally, check magnesium   DVT prophylaxis: Lovenox       Code Status: Full Code  Consults called: Neurology  Admission status: The appropriate patient status for this patient is INPATIENT. Inpatient status is judged to be reasonable and necessary in order to provide the required intensity of service to ensure the patient's safety. The patient's presenting symptoms, physical exam findings, and initial radiographic and laboratory data in the context of their chronic comorbidities is felt to place them at high risk for further clinical deterioration. Furthermore, it is not anticipated that the patient will be medically stable for discharge from the hospital within 2 midnights of admission.    I certify that at the point of admission it is my clinical judgment that the patient will require inpatient hospital care spanning beyond 2 midnights from the point of admission due to high intensity of service, high risk for further deterioration and high frequency of surveillance required  Time spent: 53 minutes  Elleah Hemsley CHRISTELLA Gail MD [facility] Pager [phone]  If 7PM-7AM, please contact night-coverage [email or web address] Password [email or web address]  06/13/2024, 7:35 AM      [1] No Known Allergies  "

## 2024-06-13 NOTE — ED Notes (Signed)
 Care delay: Pharmacy consulted for thiamine  injection. Pharmacist states it will be tubed down shortly

## 2024-06-13 NOTE — Consult Note (Signed)
 NEUROLOGY CONSULT NOTE   Date of service: June 13, 2024 Patient Name: Edwin [name] MRN:  [id number] DOB:  [birth year] Chief Complaint: Generalized weakness and numbness Requesting Provider: Zella Katha HERO, MD  History of Present Illness  Edwin [name] is a 44 y.o. male with hx of alcohol use, hypertension, tobacco use  ***  LKW: *** Modified rankin score: {Modified Rankin Scale:21264} IV Thrombolysis: ***Yes, *** No (reason) EVT: ***Yes, *** No (reason) ICH Score:***  NIHSS components Score: Comment  1a Level of Conscious 0[]  1[]  2[]  3[]      1b LOC Questions 0[]  1[]  2[]       1c LOC Commands 0[]  1[]  2[]       2 Best Gaze 0[]  1[]  2[]       3 Visual 0[]  1[]  2[]  3[]      4 Facial Palsy 0[]  1[]  2[]  3[]      5a Motor Arm - left 0[]  1[]  2[]  3[]  4[]  UN[]    5b Motor Arm - Right 0[]  1[]  2[]  3[]  4[]  UN[]    6a Motor Leg - Left 0[]  1[]  2[]  3[]  4[]  UN[]    6b Motor Leg - Right 0[]  1[]  2[]  3[]  4[]  UN[]    7 Limb Ataxia 0[]  1[]  2[]  UN[]      8 Sensory 0[]  1[]  2[]  UN[]      9 Best Language 0[]  1[]  2[]  3[]      10 Dysarthria 0[]  1[]  2[]  UN[]      11 Extinct. and Inattention 0[]  1[]  2[]       TOTAL:       ROS  ***Comprehensive ROS performed and pertinent positives documented in HPI  ***Unable to ascertain due to ***  Past History   Past Medical History:  Diagnosis Date   Alcohol abuse    Hypertension    Tobacco abuse     Past Surgical History:  Procedure Laterality Date   HERNIA REPAIR      Family History: Family History  Problem Relation Age of Onset   Heart failure Mother    Hypertension Mother    Kidney disease Father    Heart failure Father    Hypertension Father     Social History  reports that he has been smoking cigarettes. He has been exposed to tobacco smoke. He has never used smokeless tobacco. He reports current alcohol use of about 15.0 standard drinks of alcohol per week. He reports that he does not use drugs.  Allergies[1]  Medications  Current  Medications[2]  Vitals   Vitals:   06/13/24 1715 06/13/24 1730 06/13/24 1829 06/13/24 1931  BP:  (!) 147/123 (!) 161/114 (!) 163/107  Pulse:  89 77 73  Resp: (!) 27 19 16 17   Temp:  98.7 F (37.1 C) 98.4 F (36.9 C) 98.6 F (37 C)  TempSrc:  Oral Oral Oral  SpO2:  97% 99% 100%    There is no height or weight on file to calculate BMI.   Physical Exam   Constitutional: Appears well-developed and well-nourished. *** Psych: Affect appropriate to situation. *** Eyes: No scleral injection. *** HENT: No OP obstruction. *** Head: Normocephalic. *** Cardiovascular: Normal rate and regular rhythm. *** Respiratory: Effort normal, non-labored breathing. *** GI: Soft.  No distension. There is no tenderness. *** Skin: WDI. ***  Neurologic Examination   ***  Labs/Imaging/Neurodiagnostic studies   CBC:  Recent Labs  Lab Jun 30, 2024 2304  WBC 6.9  HGB 13.7  HCT 40.2  MCV 97.6  PLT 99*   Basic Metabolic Panel:  Lab Results  Component Value Date  NA 143 06/12/2024   K 3.0 (L) 06/12/2024   CO2 21 (L) 06/12/2024   GLUCOSE 103 (H) 06/12/2024   BUN 6 06/12/2024   CREATININE 0.50 (L) 06/12/2024   CALCIUM 9.2 06/12/2024   GFRNONAA >60 06/12/2024   GFRAA >60 06/03/2017   Lipid Panel:  Lab Results  Component Value Date   LDLCALC 128 (H) 08/01/2021   HgbA1c:  Lab Results  Component Value Date   HGBA1C 4.7 (L) 06/13/2024   Urine Drug Screen:     Component Value Date/Time   LABOPIA NONE DETECTED 05/25/2021 1236   COCAINSCRNUR NONE DETECTED 05/25/2021 1236   LABBENZ NONE DETECTED 05/25/2021 1236   AMPHETMU NONE DETECTED 05/25/2021 1236   THCU NONE DETECTED 05/25/2021 1236   LABBARB NONE DETECTED 05/25/2021 1236    Alcohol Level     Component Value Date/Time   ETH 330 (HH) 06/12/2024 2305   INR No results found for: INR APTT No results found for: APTT AED levels: No results found for: PHENYTOIN, ZONISAMIDE, LAMOTRIGINE, LEVETIRACETA  CT Head without  contrast(Personally reviewed): ***  CT angio Head and Neck with contrast(Personally reviewed): ***  MR Angio head without contrast and Carotid Duplex BL(Personally reviewed): ***  MRI Brain(Personally reviewed): ***  Neurodiagnostics rEEG:  ***  ASSESSMENT   Edwin [name] is a 44 y.o. male ***  RECOMMENDATIONS  *** ______________________________________________________________________    Signed, Kaycee Haycraft, MD [facility]     [1] No Known Allergies [2]  Current Facility-Administered Medications:    acetaminophen  (TYLENOL ) tablet 650 mg, 650 mg, Oral, Q6H PRN, 650 mg at 06/13/24 2053 **OR** acetaminophen  (TYLENOL ) suppository 650 mg, 650 mg, Rectal, Q6H PRN, Zella, Mir M, MD   albuterol  (PROVENTIL ) (2.5 MG/3ML) 0.083% nebulizer solution 2.5 mg, 2.5 mg, Nebulization, Q2H PRN, Zella, Mir M, MD   B-complex with vitamin C tablet 1 tablet, 1 tablet, Oral, Daily, Zella, Mir M, MD, 1 tablet at 06/13/24 1452   enoxaparin  (LOVENOX ) injection 40 mg, 40 mg, Subcutaneous, Q24H, Zella, Mir M, MD, 40 mg at 06/13/24 2210   folic acid  (FOLVITE ) tablet 1 mg, 1 mg, Oral, Daily, Zella, Mir M, MD, 1 mg at 06/13/24 1018   guaiFENesin -dextromethorphan  (ROBITUSSIN DM) 100-10 MG/5ML syrup 5 mL, 5 mL, Oral, Q4H PRN, Zella, Mir M, MD, 5 mL at 06/13/24 1019   hydrALAZINE  (APRESOLINE ) injection 10 mg, 10 mg, Intravenous, Q6H PRN, Sundil, Subrina, MD   irbesartan  (AVAPRO ) tablet 75 mg, 75 mg, Oral, Daily, Zella, Mir M, MD, 75 mg at 06/13/24 1452   LORazepam  (ATIVAN ) tablet 1-4 mg, 1-4 mg, Oral, Q1H PRN **OR** LORazepam  (ATIVAN ) injection 1-4 mg, 1-4 mg, Intravenous, Q1H PRN, Zella, Mir M, MD, 2 mg at 06/13/24 1018   nebivolol  (BYSTOLIC ) tablet 10 mg, 10 mg, Oral, Daily, Zella, Mir M, MD, 10 mg at 06/13/24 1452   ondansetron  (ZOFRAN ) tablet 4 mg, 4 mg, Oral, Q6H PRN **OR** ondansetron  (ZOFRAN ) injection 4 mg, 4 mg, Intravenous, Q6H  PRN, Zella, Mir M, MD, 4 mg at 06/13/24 1018   thiamine  (VITAMIN B1) 500 mg in sodium chloride 0.9 % 50 mL IVPB, 500 mg, Intravenous, Q8H, Last Rate: 110 mL/hr at 06/13/24 2208, 500 mg at 06/13/24 2208 **FOLLOWED BY** [START ON 06/15/2024] thiamine  (VITAMIN B1) 250 mg in sodium chloride 0.9 % 50 mL IVPB, 250 mg, Intravenous, Daily **FOLLOWED BY** [START ON 06/21/2024] thiamine  (VITAMIN B1) injection 100 mg, 100 mg, Intravenous, Daily, Dorreen Valiente, MD

## 2024-06-13 NOTE — Plan of Care (Signed)
   Problem: Education: Goal: Knowledge of General Education information will improve Description: Including pain rating scale, medication(s)/side effects and non-pharmacologic comfort measures Outcome: Progressing   Problem: Activity: Goal: Risk for activity intolerance will decrease Outcome: Progressing   Problem: Coping: Goal: Level of anxiety will decrease Outcome: Progressing

## 2024-06-13 NOTE — ED Notes (Addendum)
 Pt asleep: unable to complete CIWA assessment at this time. Vitals stable, pt not in any obvious distress

## 2024-06-13 NOTE — ED Notes (Signed)
 Pt had episode of emesis.

## 2024-06-13 NOTE — ED Provider Notes (Signed)
 " [facility]ENC[facility]PT [facility] Emergency Department Provider Note MRN:  [id number]  Arrival date & time: 06/13/2024     Chief Complaint   Numbness and Multiple complains   History of Present Illness   Edwin [name] is a 44 y.o. year-old male with a history of alcohol use disorder presenting to the ED with chief complaint of numbness.  Numbness intermittently below the knees.  Also having intermittent numbness to the hands.  This started about a month ago.  Feeling weak in the legs, having trouble balancing at work, coworkers are starting to notice.  Intermittent back pain but not currently.  Denies fever, no chest pain or shortness of breath, no other complaints.  Review of Systems  A thorough review of systems was obtained and all systems are negative except as noted in the HPI and PMH.   Patient's Health History    Past Medical History:  Diagnosis Date   Alcohol abuse    Hypertension    Tobacco abuse     Past Surgical History:  Procedure Laterality Date   HERNIA REPAIR      Family History  Problem Relation Age of Onset   Heart failure Mother    Hypertension Mother    Kidney disease Father    Heart failure Father    Hypertension Father     Social History   Socioeconomic History   Marital status: Single    Spouse name: Not on file   Number of children: Not on file   Years of education: Not on file   Highest education level: Not on file  Occupational History   Not on file  Tobacco Use   Smoking status: Every Day    Current packs/day: 1.00    Types: Cigarettes    Passive exposure: Past   Smokeless tobacco: Never  Vaping Use   Vaping status: Never Used  Substance and Sexual Activity   Alcohol use: Yes    Alcohol/week: 15.0 standard drinks of alcohol    Types: 15 Shots of liquor per week    Comment: one fifth gallon liquor every day   Drug use: No   Sexual activity: Not on file  Other Topics Concern   Not on file  Social History Narrative   Not  on file   Social Drivers of Health   Tobacco Use: High Risk (06/05/2024)   Patient History    Smoking Tobacco Use: Every Day    Smokeless Tobacco Use: Never    Passive Exposure: Past  Financial Resource Strain: Low Risk (01/31/2022)   Overall Financial Resource Strain (CARDIA)    Difficulty of Paying Living Expenses: Not very hard  Food Insecurity: No Food Insecurity (01/31/2022)   Hunger Vital Sign    Worried About Running Out of Food in the Last Year: Never true    Ran Out of Food in the Last Year: Never true  Transportation Needs: No Transportation Needs (01/31/2022)   PRAPARE - Administrator, Civil Service (Medical): No    Lack of Transportation (Non-Medical): No  Physical Activity: Not on file  Stress: Not on file  Social Connections: Not on file  Intimate Partner Violence: Not on file  Depression (PHQ2-9): Low Risk (01/03/2023)   Depression (PHQ2-9)    PHQ-2 Score: 0  Alcohol Screen: Medium Risk (01/31/2022)   Alcohol Screen    Last Alcohol Screening Score (AUDIT): 10  Housing: Low Risk (01/31/2022)   Housing    Last Housing Risk Score: 0  Utilities: Not  on file  Health Literacy: Not on file     Physical Exam   Vitals:   06/12/24 2222 06/13/24 0128  BP: (!) 146/96 136/80  Pulse: (!) 111 (!) 111  Resp: 20 17  Temp: 98.6 F (37 C) 98.6 F (37 C)  SpO2: 98% 94%    CONSTITUTIONAL: Well-appearing, NAD NEURO/PSYCH:  Alert and oriented x 3, normal and symmetric sensation, bilateral decrease strength to bilateral lower extremities EYES:  eyes equal and reactive ENT/NECK:  no LAD, no JVD CARDIO: Regular rate, well-perfused, normal S1 and S2 PULM:  CTAB no wheezing or rhonchi GI/GU:  non-distended, non-tender MSK/SPINE:  No gross deformities, no edema SKIN:  no rash, atraumatic   *Additional and/or pertinent findings included in MDM below  Diagnostic and Interventional Summary    EKG Interpretation Date/Time:  Friday June 12 2024 22:44:55  EST Ventricular Rate:  111 PR Interval:  138 QRS Duration:  97 QT Interval:  333 QTC Calculation: 453 R Axis:   36  Text Interpretation: Sinus tachycardia Probable LVH with secondary repol abnrm T wave inversions present on 12/26 Confirmed by Mannie Pac [phone]) on 06/12/2024 10:48:08 PM       Labs Reviewed  BASIC METABOLIC PANEL WITH GFR - Abnormal; Notable for the following components:      Result Value   Potassium 3.0 (*)    Chloride 96 (*)    CO2 21 (*)    Glucose, Bld 103 (*)    Creatinine, Ser 0.50 (*)    Anion gap 25 (*)    All other components within normal limits  CBC - Abnormal; Notable for the following components:   RBC 4.12 (*)    Platelets 99 (*)    All other components within normal limits  ETHANOL - Abnormal; Notable for the following components:   Alcohol, Ethyl (B) 330 (*)    All other components within normal limits  VITAMIN B12  FOLATE  TSH  VITAMIN B1  TROPONIN T, HIGH SENSITIVITY  TROPONIN T, HIGH SENSITIVITY    DG Chest 2 View  Final Result    MR Brain W and Wo Contrast    (Results Pending)  MR Cervical Spine W and Wo Contrast    (Results Pending)  MR THORACIC SPINE W WO CONTRAST    (Results Pending)  MR Lumbar Spine W Wo Contrast    (Results Pending)    Medications - No data to display   Procedures  /  Critical Care Procedures  ED Course and Medical Decision Making  Initial Impression and Ddx Differential diagnosis includes folate or B12 deficiency, Guillain-Barr syndrome, myelopathy, demyelinating CNS process.  Will consult neurology for recommendations.  Past medical/surgical history that increases complexity of ED encounter: None  Interpretation of Diagnostics I personally reviewed the Laboratory Testing and my interpretation is as follows: No significant blood count or electrolyte disturbance.  Ethanol level 330    Patient Reassessment and Ultimate Disposition/Management     Case discussed with Dr. Vanessa, plan is for  medicine admission for further laboratory evaluation, MRI imaging.  Patient management required discussion with the following services or consulting groups:  Hospitalist Service  Complexity of Problems Addressed Acute illness or injury that poses threat of life of bodily function  Additional Data Reviewed and Analyzed Further history obtained from: Prior labs/imaging results  Additional Factors Impacting ED Encounter Risk Consideration of hospitalization  Ozell HERO. Theadore, MD [facility] Emergency [facility] [email or web address]  Final Clinical Impressions(s) / ED Diagnoses  ICD-10-CM   1. Paresthesia  R20.2     2. Weakness of both lower extremities  R29.898       ED Discharge Orders     None        Discharge Instructions Discussed with and Provided to Patient:   Discharge Instructions   None      Theadore Ozell HERO, MD 06/13/24 0217  "

## 2024-06-14 ENCOUNTER — Inpatient Hospital Stay (HOSPITAL_COMMUNITY): Admitting: Certified Registered Nurse Anesthetist

## 2024-06-14 ENCOUNTER — Inpatient Hospital Stay (HOSPITAL_COMMUNITY)

## 2024-06-14 ENCOUNTER — Encounter (HOSPITAL_COMMUNITY): Payer: Self-pay | Admitting: Internal Medicine

## 2024-06-14 ENCOUNTER — Encounter (HOSPITAL_COMMUNITY): Admission: EM | Disposition: A | Payer: Self-pay | Source: Home / Self Care | Attending: Family Medicine

## 2024-06-14 DIAGNOSIS — F17210 Nicotine dependence, cigarettes, uncomplicated: Secondary | ICD-10-CM | POA: Diagnosis not present

## 2024-06-14 DIAGNOSIS — I4891 Unspecified atrial fibrillation: Secondary | ICD-10-CM

## 2024-06-14 DIAGNOSIS — R200 Anesthesia of skin: Secondary | ICD-10-CM | POA: Diagnosis not present

## 2024-06-14 DIAGNOSIS — G379 Demyelinating disease of central nervous system, unspecified: Secondary | ICD-10-CM

## 2024-06-14 DIAGNOSIS — I10 Essential (primary) hypertension: Secondary | ICD-10-CM

## 2024-06-14 HISTORY — PX: RADIOLOGY WITH ANESTHESIA: SHX6223

## 2024-06-14 LAB — COMPREHENSIVE METABOLIC PANEL WITH GFR
ALT: 71 U/L — ABNORMAL HIGH (ref 0–44)
AST: 95 U/L — ABNORMAL HIGH (ref 15–41)
Albumin: 4 g/dL (ref 3.5–5.0)
Alkaline Phosphatase: 72 U/L (ref 38–126)
Anion gap: 10 (ref 5–15)
BUN: 7 mg/dL (ref 6–20)
CO2: 30 mmol/L (ref 22–32)
Calcium: 8.9 mg/dL (ref 8.9–10.3)
Chloride: 98 mmol/L (ref 98–111)
Creatinine, Ser: 0.68 mg/dL (ref 0.61–1.24)
GFR, Estimated: >60 mL/min
Glucose, Bld: 101 mg/dL — ABNORMAL HIGH (ref 70–99)
Potassium: 4 mmol/L (ref 3.5–5.1)
Sodium: 138 mmol/L (ref 135–145)
Total Bilirubin: 1.5 mg/dL — ABNORMAL HIGH (ref 0.0–1.2)
Total Protein: 6.9 g/dL (ref 6.5–8.1)

## 2024-06-14 LAB — CBC
HCT: 37.2 % — ABNORMAL LOW (ref 39.0–52.0)
Hemoglobin: 13 g/dL (ref 13.0–17.0)
MCH: 33.9 pg (ref 26.0–34.0)
MCHC: 34.9 g/dL (ref 30.0–36.0)
MCV: 96.9 fL (ref 80.0–100.0)
Platelets: 87 K/uL — ABNORMAL LOW (ref 150–400)
RBC: 3.84 MIL/uL — ABNORMAL LOW (ref 4.22–5.81)
RDW: 13.7 % (ref 11.5–15.5)
WBC: 7.2 K/uL (ref 4.0–10.5)
nRBC: 0 % (ref 0.0–0.2)

## 2024-06-14 LAB — HIV ANTIBODY (ROUTINE TESTING W REFLEX): HIV Screen 4th Generation wRfx: NONREACTIVE

## 2024-06-14 LAB — SYPHILIS: RPR W/REFLEX TO RPR TITER AND TREPONEMAL ANTIBODIES, TRADITIONAL SCREENING AND DIAGNOSIS ALGORITHM: RPR Ser Ql: NONREACTIVE

## 2024-06-14 LAB — NO BLOOD PRODUCTS

## 2024-06-14 MED ORDER — LACTATED RINGERS IV SOLN: INTRAVENOUS | Status: DC

## 2024-06-14 MED ORDER — PHENYLEPHRINE 80 MCG/ML (10ML) SYRINGE FOR IV PUSH (FOR BLOOD PRESSURE SUPPORT)
PREFILLED_SYRINGE | INTRAVENOUS | Status: DC | PRN
  Administered 2024-06-14 (×6): 120 ug via INTRAVENOUS

## 2024-06-14 MED ORDER — ROCURONIUM BROMIDE 10 MG/ML (PF) SYRINGE
PREFILLED_SYRINGE | INTRAVENOUS | Status: DC | PRN
  Administered 2024-06-14: 50 mg via INTRAVENOUS
  Administered 2024-06-14: 10 mg via INTRAVENOUS

## 2024-06-14 MED ORDER — SUGAMMADEX SODIUM 200 MG/2ML IV SOLN
INTRAVENOUS | Status: DC | PRN
  Administered 2024-06-14: 200 mg via INTRAVENOUS

## 2024-06-14 MED ORDER — EPHEDRINE SULFATE-NACL 50-0.9 MG/10ML-% IV SOSY
PREFILLED_SYRINGE | INTRAVENOUS | Status: DC | PRN
  Administered 2024-06-14 (×5): 5 mg via INTRAVENOUS

## 2024-06-14 MED ORDER — CHLORHEXIDINE GLUCONATE 0.12 % MT SOLN
15.0000 mL | Freq: Once | OROMUCOSAL | Status: AC
  Administered 2024-06-14: 15 mL via OROMUCOSAL

## 2024-06-14 MED ORDER — LIDOCAINE HCL (CARDIAC) PF 100 MG/5ML IV SOSY
PREFILLED_SYRINGE | INTRAVENOUS | Status: DC | PRN
  Administered 2024-06-14: 100 mg via INTRAVENOUS

## 2024-06-14 MED ORDER — MIDAZOLAM HCL (PF) 2 MG/2ML IJ SOLN
INTRAMUSCULAR | Status: DC | PRN
  Administered 2024-06-14: 2 mg via INTRAVENOUS

## 2024-06-14 MED ORDER — DEXMEDETOMIDINE HCL IN NACL 80 MCG/20ML IV SOLN
INTRAVENOUS | Status: DC | PRN
  Administered 2024-06-14: 12 ug via INTRAVENOUS

## 2024-06-14 MED ORDER — MIDAZOLAM HCL 2 MG/2ML IJ SOLN
INTRAMUSCULAR | Status: AC
  Filled 2024-06-14: qty 2

## 2024-06-14 MED ORDER — MAGNESIUM SULFATE 2 GM/50ML IV SOLN
2.0000 g | Freq: Once | INTRAVENOUS | Status: AC
  Administered 2024-06-14: 2 g via INTRAVENOUS
  Filled 2024-06-14: qty 50

## 2024-06-14 MED ORDER — PROPOFOL 10 MG/ML IV BOLUS
INTRAVENOUS | Status: DC | PRN
  Administered 2024-06-14: 160 mg via INTRAVENOUS

## 2024-06-14 MED ORDER — FENTANYL CITRATE (PF) 100 MCG/2ML IJ SOLN
INTRAMUSCULAR | Status: AC
  Filled 2024-06-14: qty 2

## 2024-06-14 MED ORDER — GADOBUTROL 1 MMOL/ML IV SOLN
10.0000 mL | Freq: Once | INTRAVENOUS | Status: AC | PRN
  Administered 2024-06-14: 10 mL via INTRAVENOUS

## 2024-06-14 MED ORDER — ORAL CARE MOUTH RINSE: 15.0000 mL | Freq: Once | OROMUCOSAL | Status: AC

## 2024-06-14 MED ORDER — DEXMEDETOMIDINE HCL IN NACL 80 MCG/20ML IV SOLN
INTRAVENOUS | Status: AC
  Filled 2024-06-14: qty 20

## 2024-06-14 MED ORDER — FENTANYL CITRATE (PF) 100 MCG/2ML IJ SOLN
INTRAMUSCULAR | Status: DC | PRN
  Administered 2024-06-14 (×2): 50 ug via INTRAVENOUS

## 2024-06-14 MED ORDER — ONDANSETRON HCL 4 MG/2ML IJ SOLN
INTRAMUSCULAR | Status: DC | PRN
  Administered 2024-06-14: 4 mg via INTRAVENOUS

## 2024-06-14 MED ORDER — CHLORHEXIDINE GLUCONATE 0.12 % MT SOLN
OROMUCOSAL | Status: AC
  Filled 2024-06-14: qty 15

## 2024-06-14 MED ORDER — DEXAMETHASONE SOD PHOSPHATE PF 10 MG/ML IJ SOLN
INTRAMUSCULAR | Status: DC | PRN
  Administered 2024-06-14: 10 mg via INTRAVENOUS

## 2024-06-14 NOTE — Plan of Care (Signed)
 Visitors at bedside.  No complaints.  [place] he is going to try to do better about taking his meds and following doctors orders.    Problem: Education: Goal: Knowledge of General Education information will improve Description: Including pain rating scale, medication(s)/side effects and non-pharmacologic comfort measures Outcome: Progressing   Problem: Clinical Measurements: Goal: Respiratory complications will improve Outcome: Progressing   Problem: Clinical Measurements: Goal: Cardiovascular complication will be avoided Outcome: Progressing   Problem: Activity: Goal: Risk for activity intolerance will decrease Outcome: Progressing   Problem: Coping: Goal: Level of anxiety will decrease Outcome: Progressing   Problem: Nutrition: Goal: Adequate nutrition will be maintained Outcome: Progressing   Problem: Safety: Goal: Ability to remain free from injury will improve Outcome: Progressing   Problem: Cardiac: Goal: Ability to maintain an adequate cardiac output Outcome: Progressing

## 2024-06-14 NOTE — Plan of Care (Signed)
  Problem: Clinical Measurements: Goal: Ability to maintain clinical measurements within normal limits will improve Outcome: Progressing Goal: Will remain free from infection Outcome: Progressing Goal: Diagnostic test results will improve Outcome: Progressing Goal: Respiratory complications will improve Outcome: Progressing Goal: Cardiovascular complication will be avoided Outcome: Progressing   Problem: Activity: Goal: Risk for activity intolerance will decrease Outcome: Progressing   Problem: Nutrition: Goal: Adequate nutrition will be maintained Outcome: Progressing   Problem: Elimination: Goal: Will not experience complications related to bowel motility Outcome: Progressing Goal: Will not experience complications related to urinary retention Outcome: Progressing   Problem: Pain Managment: Goal: General experience of comfort will improve and/or be controlled Outcome: Progressing   Problem: Skin Integrity: Goal: Risk for impaired skin integrity will decrease Outcome: Progressing   Problem: Safety: Goal: Ability to remain free from injury will improve Outcome: Progressing

## 2024-06-14 NOTE — Anesthesia Procedure Notes (Signed)
 Procedure Name: Intubation Date/Time: 06/14/2024 9:21 AM  Performed by: Cindie Donald CROME, CRNAPre-anesthesia Checklist: Patient identified, Emergency Drugs available, Suction available and Patient being monitored Patient Re-evaluated:Patient Re-evaluated prior to induction Oxygen Delivery Method: Circle System Utilized Preoxygenation: Pre-oxygenation with 100% oxygen Induction Type: IV induction Ventilation: Mask ventilation without difficulty Laryngoscope Size: Glidescope and 4 Grade View: Grade I Tube type: Oral Tube size: 7.5 mm Number of attempts: 1 Airway Equipment and Method: Stylet Placement Confirmation: ETT inserted through vocal cords under direct vision, positive ETCO2 and breath sounds checked- equal and bilateral Secured at: 22 cm Tube secured with: Tape Dental Injury: Teeth and Oropharynx as per pre-operative assessment

## 2024-06-14 NOTE — Progress Notes (Signed)
 Transported to PACU after report given. Transported in bed.

## 2024-06-14 NOTE — Anesthesia Preprocedure Evaluation (Addendum)
"                                    Anesthesia Evaluation  Patient identified by MRN, date of birth, ID band Patient awake    Reviewed: Allergy & Precautions, H&P , NPO status , Patient's Chart, lab work & pertinent test results, reviewed documented beta blocker date and time   Airway Mallampati: II  TM Distance: >3 FB Neck ROM: Full    Dental no notable dental hx. (+) Teeth Intact, Dental Advisory Given   Pulmonary Current Smoker   Pulmonary exam normal breath sounds clear to auscultation       Cardiovascular hypertension, Pt. on medications and Pt. on home beta blockers  Rhythm:Regular Rate:Normal     Neuro/Psych negative neurological ROS  negative psych ROS   GI/Hepatic negative GI ROS,,,(+)     substance abuse  alcohol use  Endo/Other  negative endocrine ROS    Renal/GU negative Renal ROS  negative genitourinary   Musculoskeletal   Abdominal   Peds  Hematology negative hematology ROS (+)   Anesthesia Other Findings   Reproductive/Obstetrics negative OB ROS                              Anesthesia Physical Anesthesia Plan  ASA: 3  Anesthesia Plan: General   Post-op Pain Management: Minimal or no pain anticipated   Induction: Intravenous  PONV Risk Score and Plan: 1 and Midazolam  and Ondansetron   Airway Management Planned: Oral ETT  Additional Equipment:   Intra-op Plan:   Post-operative Plan: Extubation in OR  Informed Consent: I have reviewed the patients History and Physical, chart, labs and discussed the procedure including the risks, benefits and alternatives for the proposed anesthesia with the patient or authorized representative who has indicated his/her understanding and acceptance.     Dental advisory given  Plan Discussed with: CRNA  Anesthesia Plan Comments:          Anesthesia Quick Evaluation  "

## 2024-06-14 NOTE — Progress Notes (Signed)
 [facility]  PROGRESS NOTE  Edwin [name] FMW:[id number] DOB: 09-Feb-1981 DOA: 06/12/2024 PCP: Patient, No Pcp Per   Brief HPI:   44 y.o. male with medical history significant for hypertension and alcohol abuse being admitted to the hospital with several months of bilateral hand and feet numbness, generalized weakness and imbalance.  He tells me the symptoms started several years ago, maybe nearly a year ago.  He drinks alcohol daily and his last drink was yesterday morning before he came to the ER.  He denies any fevers, chills, nausea, vomiting, shortness of breath, syncope, headaches, blurry vision, double vision.  Denies any focal weakness, however he has constant subjective numbness and tingling in his bilateral hands and feet.  He does admit to some imbalance, but he feels like it is because his legs do not respond when he tries to walk.     Assessment/Plan:    Global weakness, with extremity numbness and tingling-will need to rule out central nervous system pathology, no recent illness, no evidence of acute infection.  Could be neuropathy related to alcohol abuse, but he has marked weakness. - Electrocardiogram ordered lesion traversing the thecal sac at the S2 level with normal restoring nerve roots.  Differentials include postinflammatory pericarditis/adhesions   Alcohol abuse-last drink was the morning of 1/2 -Thiamine , folate, multivitamin -Ativan  per CIWA protocol   Abnormal LFT -due to alcohol abuse, will trend   Hypertension -continue home ARB and Nebivolol    Hypokalemia -replete   Hypomagnesemia -Magnesium  is 1.6 -Will replace magnesium        DVT prophylaxis: Lovenox   Medications     [MAR Hold] B-complex with vitamin C  1 tablet Oral Daily   [MAR Hold] enoxaparin  (LOVENOX ) injection  40 mg Subcutaneous Q24H   [MAR Hold] folic acid   1 mg Oral Daily   [MAR Hold] irbesartan   75 mg Oral Daily   [MAR Hold] nebivolol   10 mg Oral Daily   [MAR Hold]  thiamine  (VITAMIN B1) injection  100 mg Intravenous Daily     Data Reviewed:   CBG:  No results for input(s): GLUCAP in the last 168 hours.  SpO2: 97 %    Vitals:   06/14/24 0331 06/14/24 0725 06/14/24 0830 06/14/24 0833  BP: (!) 141/100 (!) 149/108  (!) 161/108  Pulse: 71 71  71  Resp: 17 17  18   Temp: 98.6 F (37 C) 99.2 F (37.3 C) (P) 98.5 F (36.9 C)   TempSrc: Oral Oral (P) Oral Oral  SpO2: 99% 95%  97%      Data Reviewed:  Basic Metabolic Panel: Recent Labs  Lab 06/12/24 2304 06/13/24 0049 06/14/24 0641  NA 143  --  138  K 3.0*  --  4.0  CL 96*  --  98  CO2 21*  --  30  GLUCOSE 103*  --  101*  BUN 6  --  7  CREATININE 0.50*  --  0.68  CALCIUM 9.2  --  8.9  MG  --  1.6*  --     CBC: Recent Labs  Lab 06/12/24 2304 06/14/24 0641  WBC 6.9 7.2  HGB 13.7 13.0  HCT 40.2 37.2*  MCV 97.6 96.9  PLT 99* 87*    LFT Recent Labs  Lab 06/14/24 0641  AST 95*  ALT 71*  ALKPHOS 72  BILITOT 1.5*  PROT 6.9  ALBUMIN 4.0     Antibiotics: Anti-infectives (From admission, onward)    None        CONSULTS neurology  Code Status: Full code  Family Communication:      Subjective   Continues to have numbness in lower extremities   Objective    Physical Examination:   Appears in no acute distress Neuro-sensations intact in lower extremities, moving all extremities Abdomen is soft, nontender            Edwin [name] S Corinna Burkman   [facility] If 7PM-7AM, please contact night-coverage at [email or web address], Office  [phone]   06/14/2024, 9:56 AM  LOS: 1 day

## 2024-06-14 NOTE — Progress Notes (Signed)
 Returned back from MRI.  No complaints.  Transported in bed accompanied by 2 nurses.

## 2024-06-14 NOTE — Transfer of Care (Signed)
 Immediate Anesthesia Transfer of Care Note  Patient: Edwin [name]  Procedure(s) Performed: MRI WITH ANESTHESIA  Patient Location: PACU  Anesthesia Type:General  Level of Consciousness: awake, alert , oriented, and patient cooperative  Airway & Oxygen Therapy: Patient Spontanous Breathing and Patient connected to nasal cannula oxygen  Post-op Assessment: Report given to RN and Post -op Vital signs reviewed and stable  Post vital signs: Reviewed and stable  Last Vitals:  Vitals Value Taken Time  BP 140/97 06/14/24 11:31  Temp    Pulse 91 06/14/24 11:33  Resp 26 06/14/24 11:33  SpO2 91 % 06/14/24 11:33  Vitals shown include unfiled device data.  Last Pain:  Vitals:   06/14/24 0833  TempSrc: Oral  PainSc: 0-No pain         Complications: No notable events documented.

## 2024-06-14 NOTE — Anesthesia Postprocedure Evaluation (Signed)
"   Anesthesia Post Note  Patient: Edwin [name]  Procedure(s) Performed: MRI WITH ANESTHESIA     Patient location during evaluation: PACU Anesthesia Type: General Level of consciousness: awake and alert Pain management: pain level controlled Vital Signs Assessment: post-procedure vital signs reviewed and stable Respiratory status: spontaneous breathing, nonlabored ventilation, respiratory function stable and patient connected to nasal cannula oxygen Cardiovascular status: blood pressure returned to baseline and stable Postop Assessment: no apparent nausea or vomiting Anesthetic complications: no   No notable events documented.  Last Vitals:  Vitals:   06/14/24 1200 06/14/24 1224  BP: (!) 144/102 (!) 152/101  Pulse: 75 78  Resp: (!) 23 18  Temp: 36.9 C 37.1 C  SpO2: 93% 100%    Last Pain:  Vitals:   06/14/24 1224  TempSrc: Oral  PainSc: 0-No pain                 Jehan Ranganathan,W. EDMOND      "

## 2024-06-15 ENCOUNTER — Other Ambulatory Visit (HOSPITAL_COMMUNITY): Payer: Self-pay

## 2024-06-15 ENCOUNTER — Encounter (HOSPITAL_COMMUNITY): Payer: Self-pay | Admitting: Radiology

## 2024-06-15 DIAGNOSIS — R0989 Other specified symptoms and signs involving the circulatory and respiratory systems: Secondary | ICD-10-CM | POA: Diagnosis not present

## 2024-06-15 DIAGNOSIS — R29898 Other symptoms and signs involving the musculoskeletal system: Secondary | ICD-10-CM | POA: Diagnosis not present

## 2024-06-15 DIAGNOSIS — R202 Paresthesia of skin: Secondary | ICD-10-CM | POA: Diagnosis not present

## 2024-06-15 DIAGNOSIS — R29818 Other symptoms and signs involving the nervous system: Secondary | ICD-10-CM | POA: Diagnosis not present

## 2024-06-15 LAB — PROTEIN ELECTROPHORESIS, SERUM
A/G Ratio: 1.2 (ref 0.7–1.7)
Albumin ELP: 3.7 g/dL (ref 2.9–4.4)
Alpha-1-Globulin: 0.3 g/dL (ref 0.0–0.4)
Alpha-2-Globulin: 0.6 g/dL (ref 0.4–1.0)
Beta Globulin: 1.5 g/dL — ABNORMAL HIGH (ref 0.7–1.3)
Gamma Globulin: 0.7 g/dL (ref 0.4–1.8)
Globulin, Total: 3.1 g/dL (ref 2.2–3.9)
Total Protein ELP: 6.8 g/dL (ref 6.0–8.5)

## 2024-06-15 LAB — MAGNESIUM: Magnesium: 2.1 mg/dL (ref 1.7–2.4)

## 2024-06-15 MED ORDER — FOLIC ACID 1 MG PO TABS
1.0000 mg | ORAL_TABLET | Freq: Every day | ORAL | 1 refills | Status: AC
  Filled 2024-06-15: qty 30, 30d supply, fill #0

## 2024-06-15 MED ORDER — THIAMINE HCL 100 MG PO TABS
100.0000 mg | ORAL_TABLET | Freq: Every day | ORAL | 0 refills | Status: AC
  Filled 2024-06-15: qty 30, 30d supply, fill #0

## 2024-06-15 NOTE — Progress Notes (Signed)
 Patient discharged, AVS instructions given, Pt verbalized understanding

## 2024-06-15 NOTE — Discharge Instructions (Signed)
 Check liver function tests at PCP office in 1 week

## 2024-06-15 NOTE — Plan of Care (Signed)
" °  Problem: Health Behavior/Discharge Planning: Goal: Ability to manage health-related needs will improve Outcome: Progressing   Problem: Clinical Measurements: Goal: Ability to maintain clinical measurements within normal limits will improve Outcome: Progressing Goal: Will remain free from infection Outcome: Progressing Goal: Diagnostic test results will improve Outcome: Progressing Goal: Respiratory complications will improve Outcome: Progressing Goal: Cardiovascular complication will be avoided Outcome: Progressing   Problem: Activity: Goal: Risk for activity intolerance will decrease Outcome: Progressing   Problem: Coping: Goal: Level of anxiety will decrease Outcome: Progressing   Problem: Elimination: Goal: Will not experience complications related to bowel motility Outcome: Progressing Goal: Will not experience complications related to urinary retention Outcome: Progressing   Problem: Nutrition: Goal: Adequate nutrition will be maintained Outcome: Progressing   Problem: Pain Managment: Goal: General experience of comfort will improve and/or be controlled Outcome: Progressing   Problem: Safety: Goal: Ability to remain free from injury will improve Outcome: Progressing   Problem: Skin Integrity: Goal: Risk for impaired skin integrity will decrease Outcome: Progressing   "

## 2024-06-15 NOTE — Progress Notes (Signed)
 Physical Therapy Quick Note  PT has completed initial evaluation.    Overall, patient at supervision assistance level.   PT Follow up recommended: Outpatient PT Equipment recommended:  None recommended Complete evaluation note to follow.     Bernardino JINNY Ruth, PT, DPT Acute Rehabilitation Office [phone]

## 2024-06-15 NOTE — TOC CAGE-AID Note (Signed)
 Transition of Care [facility]) - CAGE-AID Screening   Patient Details  Name: Edwin [name] MRN: [id number] Date of Birth: 1980/12/15  Transition of Care [facility]) CM/SW Contact:    [name], RN Phone Number: 06/15/2024, 11:09 AM   Clinical Narrative: CM provided him inpatient/ outpatient counseling resources.   CAGE-AID Screening:    Have You Ever Felt You Ought to Cut Down on Your Drinking or Drug Use?: Yes Have People Annoyed You By Critizing Your Drinking Or Drug Use?: No Have You Felt Bad Or Guilty About Your Drinking Or Drug Use?: No Have You Ever Had a Drink or Used Drugs First Thing In The Morning to Steady Your Nerves or to Get Rid of a Hangover?: No CAGE-AID Score: 1  Substance Abuse Education Offered: Yes (provided)

## 2024-06-15 NOTE — Plan of Care (Signed)
 " Problem: Education: Goal: Knowledge of General Education information will improve Description: Including pain rating scale, medication(s)/side effects and non-pharmacologic comfort measures 06/15/2024 1545 by Evalynn Hankins, RN Outcome: Adequate for Discharge 06/15/2024 1544 by Loui Massenburg, RN Outcome: Progressing   Problem: Health Behavior/Discharge Planning: Goal: Ability to manage health-related needs will improve 06/15/2024 1545 by Shambhavi Salley, RN Outcome: Adequate for Discharge 06/15/2024 1544 by Raenelle Brownie, RN Outcome: Progressing   Problem: Clinical Measurements: Goal: Ability to maintain clinical measurements within normal limits will improve 06/15/2024 1545 by Carey Lafon, RN Outcome: Adequate for Discharge 06/15/2024 1544 by Stevens Magwood, RN Outcome: Progressing Goal: Will remain free from infection 06/15/2024 1545 by Sung Renton, RN Outcome: Adequate for Discharge 06/15/2024 1544 by Keshana Klemz, RN Outcome: Progressing Goal: Diagnostic test results will improve 06/15/2024 1545 by Maudie Shingledecker, RN Outcome: Adequate for Discharge 06/15/2024 1544 by Jackson Fetters, RN Outcome: Progressing Goal: Respiratory complications will improve 06/15/2024 1545 by Jorey Dollard, RN Outcome: Adequate for Discharge 06/15/2024 1544 by Priti Consoli, RN Outcome: Progressing Goal: Cardiovascular complication will be avoided 06/15/2024 1545 by Baylor Teegarden, RN Outcome: Adequate for Discharge 06/15/2024 1544 by Makari Portman, RN Outcome: Progressing   Problem: Activity: Goal: Risk for activity intolerance will decrease 06/15/2024 1545 by Elvis Boot, RN Outcome: Adequate for Discharge 06/15/2024 1544 by Raenelle Brownie, RN Outcome: Progressing   Problem: Nutrition: Goal: Adequate nutrition will be maintained 06/15/2024 1545 by Jadrien Narine, RN Outcome: Adequate for Discharge 06/15/2024 1544 by Raenelle Brownie, RN Outcome: Progressing   Problem:  Coping: Goal: Level of anxiety will decrease 06/15/2024 1545 by Keshawn Sundberg, RN Outcome: Adequate for Discharge 06/15/2024 1544 by Raenelle Brownie, RN Outcome: Progressing   Problem: Elimination: Goal: Will not experience complications related to bowel motility 06/15/2024 1545 by Yee Gangi, RN Outcome: Adequate for Discharge 06/15/2024 1544 by Fayette Gasner, RN Outcome: Progressing Goal: Will not experience complications related to urinary retention 06/15/2024 1545 by Yonael Tulloch, RN Outcome: Adequate for Discharge 06/15/2024 1544 by Aiko Belko, RN Outcome: Progressing   Problem: Pain Managment: Goal: General experience of comfort will improve and/or be controlled 06/15/2024 1545 by Jaedan Huttner, RN Outcome: Adequate for Discharge 06/15/2024 1544 by Raenelle Brownie, RN Outcome: Progressing   Problem: Safety: Goal: Ability to remain free from injury will improve 06/15/2024 1545 by Camdan Burdi, RN Outcome: Adequate for Discharge 06/15/2024 1544 by Raenelle Brownie, RN Outcome: Progressing   Problem: Skin Integrity: Goal: Risk for impaired skin integrity will decrease 06/15/2024 1545 by Komal Stangelo, RN Outcome: Adequate for Discharge 06/15/2024 1544 by Enisa Runyan, RN Outcome: Progressing   Problem: Education: Goal: Knowledge of the prescribed therapeutic regimen will improve 06/15/2024 1545 by Keelen Quevedo, RN Outcome: Adequate for Discharge 06/15/2024 1544 by Talaysia Pinheiro, RN Outcome: Progressing   Problem: Bowel/Gastric: Goal: Gastrointestinal status for postoperative course will improve 06/15/2024 1545 by Nesiah Jump, RN Outcome: Adequate for Discharge 06/15/2024 1544 by Robynne Roat, RN Outcome: Progressing   Problem: Cardiac: Goal: Ability to maintain an adequate cardiac output 06/15/2024 1545 by Taniah Reinecke, RN Outcome: Adequate for Discharge 06/15/2024 1544 by Griffon Herberg, RN Outcome: Progressing Goal: Will show no evidence of  cardiac arrhythmias 06/15/2024 1545 by Derry Arbogast, RN Outcome: Adequate for Discharge 06/15/2024 1544 by Bradly Sangiovanni, RN Outcome: Progressing   Problem: Nutritional: Goal: Will attain and maintain optimal nutritional status 06/15/2024 1545 by Daneisha Surges, RN Outcome: Adequate for Discharge 06/15/2024 1544 by Laasya Peyton, RN Outcome: Progressing   Problem: Neurological: Goal: Will regain or maintain usual level of  consciousness 06/15/2024 1545 by Eleana Tocco, RN Outcome: Adequate for Discharge 06/15/2024 1544 by Ernestine Langworthy, RN Outcome: Progressing   Problem: Clinical Measurements: Goal: Ability to maintain clinical measurements within normal limits 06/15/2024 1545 by Alisandra Son, RN Outcome: Adequate for Discharge 06/15/2024 1544 by Zuleima Haser, RN Outcome: Progressing Goal: Postoperative complications will be avoided or minimized 06/15/2024 1545 by Breeley Bischof, RN Outcome: Adequate for Discharge 06/15/2024 1544 by Seydina Holliman, RN Outcome: Progressing   Problem: Respiratory: Goal: Will regain and/or maintain adequate ventilation 06/15/2024 1545 by Xylia Scherger, RN Outcome: Adequate for Discharge 06/15/2024 1544 by Leda Bellefeuille, RN Outcome: Progressing Goal: Respiratory status will improve 06/15/2024 1545 by Shirl Weir, RN Outcome: Adequate for Discharge 06/15/2024 1544 by Raenelle Brownie, RN Outcome: Progressing   Problem: Skin Integrity: Goal: Demonstrates signs of wound healing without infection 06/15/2024 1545 by Stewart Pimenta, RN Outcome: Adequate for Discharge 06/15/2024 1544 by Kimmy Totten, RN Outcome: Progressing   Problem: Urinary Elimination: Goal: Will remain free from infection 06/15/2024 1545 by Phuong Hillary, RN Outcome: Adequate for Discharge 06/15/2024 1544 by Kadajah Kjos, RN Outcome: Progressing Goal: Ability to achieve and maintain adequate urine output 06/15/2024 1545 by Letzy Gullickson, RN Outcome: Adequate for  Discharge 06/15/2024 1544 by Dominic Mahaney, RN Outcome: Progressing   "

## 2024-06-15 NOTE — TOC Transition Note (Addendum)
 Transition of Care [facility]) - Discharge Note   Patient Details  Name: Edwin [name] MRN: [id number] Date of Birth: [birth year]  [facility]) CM/SW Contact:  [name], RN Phone Number: 06/15/2024, 11:10 AM   Clinical Narrative:     Pt is discharging home with self care.  Pt without a PCP. CM was able to get an appointment and placed information on the AVS.  Pt has his car at [facility]. CM will provide him a cab to get to his vehicle.   1331: PT recommending outpatient therapy. CM has sent the referral to [facility]. Pt will call to schedule the first appointment. Information on the AVS.  Final next level of care: Home/Self Care Barriers to Discharge: No Barriers Identified   Patient Goals and CMS Choice            Discharge Placement                       Discharge Plan and Services Additional resources added to the After Visit Summary for                                       Social Drivers of Health (SDOH) Interventions SDOH Screenings   Food Insecurity: No Food Insecurity (06/14/2024)  Housing: Low Risk (06/14/2024)  Transportation Needs: No Transportation Needs (06/14/2024)  Utilities: Not At Risk (06/14/2024)  Alcohol Screen: Medium Risk (01/31/2022)  Depression (PHQ2-9): Low Risk (01/03/2023)  Financial Resource Strain: Low Risk (01/31/2022)  Tobacco Use: High Risk (06/14/2024)     Readmission Risk Interventions     No data to display

## 2024-06-15 NOTE — Plan of Care (Signed)

## 2024-06-15 NOTE — Evaluation (Signed)
 Physical Therapy Evaluation Patient Details Name: Edwin [name] MRN: [id number] DOB: 02-14-1981 Today's Date: 06/15/2024  History of Present Illness  44 y.o. male presents to [facility] hospital on 06/12/2024 with bilateral hand and foot numbness, generalized weakness and imbalance. PMH includes HTN and alcohol abuse.  Clinical Impression  Pt presents to PT with deficits in sensation, strength, power, gait, balance. Pt reports numbness in BLE distal to the knee, along with intermittent numbness and tingling in hands and fingers. Pt also demonstrates weakness in all extremities. Pt remains able to ambulate for limited community distances with a reduced gait speed, taking dynamic gait challenges with cautions. PT provides suggestions of a nightlight in the bedroom as the pt is more dependent on visual input for balance due to his LE sensation deficits. PT recommends outpatient PT follow-up after discharge in an effort to improve strength and stability.        If plan is discharge home, recommend the following: Assistance with cooking/housework;Assist for transportation;Help with stairs or ramp for entrance   Can travel by private vehicle        Equipment Recommendations None recommended by PT  Recommendations for Other Services       Functional Status Assessment Patient has had a recent decline in their functional status and demonstrates the ability to make significant improvements in function in a reasonable and predictable amount of time.     Precautions / Restrictions Precautions Precautions: Fall Recall of Precautions/Restrictions: Intact Restrictions Weight Bearing Restrictions Per Provider Order: No      Mobility  Bed Mobility Overal bed mobility: Independent                  Transfers Overall transfer level: Independent Equipment used: None                    Ambulation/Gait Ambulation/Gait assistance: Supervision Gait Distance (Feet): 600 Feet Assistive  device: None Gait Pattern/deviations: Step-through pattern, Drifts right/left Gait velocity: reduced Gait velocity interpretation: 1.31 - 2.62 ft/sec, indicative of limited community ambulator   General Gait Details: pt with slowed step-through gait, pt with multiple instances of mild lateral instability with dynamic gait challenges but pt is able to correct all with stepping strategy. Pt performs lateral head turns, changes stride length and gait speed, walks backward for dynamic balance challenges.  Stairs Stairs:  (pt declines the need for stair negotiation assessment)          Wheelchair Mobility     Tilt Bed    Modified Rankin (Stroke Patients Only)       Balance Overall balance assessment: Needs assistance Sitting-balance support: No upper extremity supported, Feet supported Sitting balance-Leahy Scale: Good     Standing balance support: During functional activity, No upper extremity supported Standing balance-Leahy Scale: Good           Rhomberg - Eyes Opened: 30 Rhomberg - Eyes Closed: 25 (pt opens eyes, does not lose balance)   High Level Balance Comments: pt is able to retrieve a pen from the floor via squatting             Pertinent Vitals/Pain Pain Assessment Pain Assessment: No/denies pain    Home Living Family/patient expects to be discharged to:: Private residence Living Arrangements: Alone Available Help at Discharge: Friend(s);Available PRN/intermittently (significant other) Type of Home: Apartment Home Access: Stairs to enter Entrance Stairs-Rails: Can reach both Entrance Stairs-Number of Steps: 3 flights   Home Layout: One level Home Equipment: None  Prior Function Prior Level of Function : Independent/Modified Independent;Working/employed;Driving             Mobility Comments: ambulatory without DME, has had to leave work multiple times in recent months due to imbalance and 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       Extremity/Trunk Assessment   Upper Extremity Assessment Upper Extremity Assessment: RUE deficits/detail;LUE deficits/detail RUE Deficits / Details: ROM WFL, grossly 4/5 RUE Sensation: decreased light touch LUE Deficits / Details: ROM WFL, grossly 4/5 LUE Sensation: decreased light touch    Lower Extremity Assessment Lower Extremity Assessment: RLE deficits/detail;LLE deficits/detail RLE Deficits / Details: ROM WFL, grossly 4-/5 RLE Sensation: decreased light touch (distal to knee pt reports numbness, can still sense light touch) LLE Deficits / Details: ROM WFL, grossly 4-/5 LLE Sensation: decreased light touch (distal to knee pt reports numbness, can still sense light touch)    Cervical / Trunk Assessment Cervical / Trunk Assessment: Normal  Communication   Communication Communication: No apparent difficulties    Cognition Arousal: Alert Behavior During Therapy: WFL for tasks assessed/performed   PT - Cognitive impairments: No apparent impairments                         Following commands: Intact       Cueing Cueing Techniques: Verbal cues     General Comments General comments (skin integrity, edema, etc.): VSS on RA    Exercises     Assessment/Plan    PT Assessment Patient needs continued PT services  PT Problem List Decreased strength;Decreased activity tolerance;Decreased balance;Decreased mobility;Impaired sensation       PT Treatment Interventions DME instruction;Gait training;Stair training;Functional mobility training;Therapeutic activities;Therapeutic exercise;Balance training;Neuromuscular re-education;Patient/family education    PT Goals (Current goals can be found in the Care Plan section)  Acute Rehab PT Goals Patient Stated Goal: to improve strenght and balance, be able to work PT Goal Formulation: With patient Time For Goal Achievement: 06/29/24 Potential to Achieve Goals: Fair    Frequency Min 2X/week      Co-evaluation               AM-PAC PT 6 Clicks Mobility  Outcome Measure Help needed turning from your back to your side while in a flat bed without using bedrails?: None Help needed moving from lying on your back to sitting on the side of a flat bed without using bedrails?: None Help needed moving to and from a bed to a chair (including a wheelchair)?: None Help needed standing up from a chair using your arms (e.g., wheelchair or bedside chair)?: None Help needed to walk in hospital room?: A Little Help needed climbing 3-5 steps with a railing? : A Little 6 Click Score: 22    End of Session Equipment Utilized During Treatment: Gait belt Activity Tolerance: Patient tolerated treatment well Patient left: in bed;with call bell/phone within reach Nurse Communication: Mobility status PT Visit Diagnosis: Other abnormalities of gait and mobility (R26.89);Other symptoms and signs involving the nervous system (R29.898)    Time: 8694-8674 PT Time Calculation (min) (ACUTE ONLY): 20 min   Charges:   PT Evaluation $PT Eval Low Complexity: 1 Low   PT General Charges $$ ACUTE PT VISIT: 1 Visit         Bernardino JINNY Ruth, PT, DPT Acute Rehabilitation Office [phone]   Bernardino JINNY Ruth 06/15/2024, 4:11 PM

## 2024-06-15 NOTE — Discharge Summary (Addendum)
 " Physician Discharge Summary   Patient: Edwin [name] MRN: [id number] DOB: 11/15/1980  Admit date:     06/12/2024  Discharge date: 06/15/2024  Discharge Physician: Edwin [name]   PCP: Edwin [name]   Recommendations at discharge:   Follow-up PCP in 2 days Follow-up neurology  for EMG/nerve conduction study Will need MRI of lumbar spine with and without contrast in 3 months to check stability of arachnoid web/adhesion Outpatient PT has been set up  Discharge Diagnoses: Principal Problem:   Neurovascular deficit  Resolved Problems:   * No resolved hospital problems. *  Hospital Course: 44 y.o. male with medical history significant for hypertension and alcohol abuse being admitted to the hospital with several months of bilateral hand and feet numbness, generalized weakness and imbalance.  He tells me the symptoms started several years ago, maybe nearly a year ago.  He drinks alcohol daily and his last drink was yesterday morning before he came to the ER.  He denies any fevers, chills, nausea, vomiting, shortness of breath, syncope, headaches, blurry vision, double vision.  Denies any focal weakness, however he has constant subjective numbness and tingling in his bilateral hands and feet.  He does admit to some imbalance, but he feels like it is because his legs do not respond when he tries to walk.     Assessment and Plan:  Global weakness, with extremity numbness and tingling - -MRI l spine wwo 1. 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 Differential considerations include postinflammatory arachnoiditis/adhesions (prior infection, hemorrhage, or surgery), less likely a small arachnoid cyst, and unlikely leptomeningeal/inflammatory disease given lack of enhancement. 2. If symptomatic or if symptoms progress, recommend clinical correlation and neurosurgical consultation; consider follow-up lumbar spine MRI (with  and without contrast) to assess for interval change (or sooner for new neurologic deficits). Neurology recommends to discharge with outpatient PT -Repeat MRI lumbar spine with and without contrast in 3 months to check for stability of arachnoid web/lesion -Will need outpatient EMG/NCS -Follow-up neurology as outpatient -Will give letter to stay out of work for 2 weeks and then follow-up with PCP for short-term disability -PCP appointment has been set up on 06/17/2024   Alcohol abuse-last drink was the morning of 1/2 No signs and symptoms of  alcohol withdrawal Will discharge on thiamine  and folate  Thrombocytopenia - Likely in setting of alcohol abuse  Abnormal LFT -due to alcohol abuse - Advised to quit alcohol -Follow LFTs as outpatient at PCP office   Hypertension -continue home ARB and Nebivolol    Hypokalemia -replete    Hypomagnesemia - Replete   B12 level 2976           Consultants: Neurology Procedures performed:  Disposition: Home Diet recommendation:  Regular diet DISCHARGE MEDICATION: Allergies as of 06/15/2024   No Known Allergies      Medication List     STOP taking these medications    b complex vitamins capsule       TAKE these medications    folic acid  1 MG tablet Commonly known as: FOLVITE  Take 1 tablet (1 mg total) by mouth daily. Start taking on: June 16, 2024   nebivolol  10 MG tablet Commonly known as: BYSTOLIC  Take 1 tablet (10 mg total) by mouth daily.   pantoprazole  40 MG tablet Commonly known as: PROTONIX  Take 1 tablet (40 mg total) by mouth daily.   thiamine  100 MG tablet Commonly known as: VITAMIN B1 Take  1 tablet (100 mg total) by mouth daily.   valsartan  80 MG tablet Commonly known as: DIOVAN  Take 1 tablet (80 mg total) by mouth daily.        Follow-up Information     Edwin Bare, MD Follow up on 06/17/2024.   Specialty: Internal Medicine Why: Your appointment is at 3:30 pm. Please arrive early and  bring a picture ID, current medications and insurance card Contact information: [address] [place] [address] [phone]         [facility]. Schedule an appointment as soon as possible for a visit.   Specialty: Rehabilitation Contact information: [address] [place]  [address] [phone]        [facility] Follow up.   Specialty: Neurology Why: Follow-up with neurology as outpatient for EMG/nerve conduction study Contact information: [address] [place]  [address] [phone]               Discharge Exam: Edwin [name]   06/15/24 1300  Weight: 93 kg   General-appears in no acute distress Heart-S1-S2, regular, no murmur auscultated Lungs-clear to auscultation bilaterally, no wheezing or crackles auscultated Abdomen-soft, nontender, no organomegaly Extremities-no edema in the lower extremities Neuro-alert, oriented x3, no focal deficit noted  Condition at discharge: good  The results of significant diagnostics from this hospitalization (including imaging, microbiology, ancillary and laboratory) are listed below for reference.   Imaging Studies: MR THORACIC SPINE W WO CONTRAST Result Date: 06/14/2024 EXAM: MRI THORACIC SPINE WITH AND WITHOUT INTRAVENOUS CONTRAST 06/14/2024 11:24:18 AM TECHNIQUE: Multiplanar multisequence MRI of the thoracic spine was performed with and without the administration of 10 mL gadobutrol  (GADAVIST ) 1 MMOL/ML injection. COMPARISON: None available. CLINICAL HISTORY: Demyelinating disease. FINDINGS: BONES AND ALIGNMENT: Normal alignment. Normal vertebral body heights. Bone marrow signal is unremarkable. SPINAL CORD: There is no evidence of demyelinating disease. The spinal cord is normal in morphology and signal intensity. SOFT TISSUES: There are irregular reticular and streaky opacities present dependently within the  lungs bilaterally. DEGENERATIVE CHANGES: No significant disc herniation. The spinal canal and neural foramina are widely patent throughout the thoracic spine. IMPRESSION: 1. No evidence of demyelinating disease in the thoracic spinal cord. 2. Irregular dependent reticular and streaky opacities in both lungs, favored atelectasis; correlate clinically and consider chest imaging if indicated. Electronically signed by: Evalene Coho MD 06/14/2024 12:00 PM EST RP Workstation: [id number]   MR Lumbar Spine W Wo Contrast Result Date: 06/14/2024 EXAM: MRI LUMBAR SPINE 06/14/2024 11:23:58 AM TECHNIQUE: Multiplanar multisequence MRI of the lumbar spine was performed without and with the administration of 10 mL gadobutrol  (GADAVIST ) 1 MMOL/ML intravenous contrast. COMPARISON: None available. CLINICAL HISTORY: Demyelinating disease. FINDINGS: BONES AND ALIGNMENT: Normal alignment. Normal vertebral body heights. Bone marrow signal is unremarkable. SPINAL CORD: The conus medullaris terminates at the lower body of L1. There is an arachnoid web or adhesion traversing the thecal sac at the S2 level. The nerve roots appear well distributed and normal. There is no abnormal spinal cord or nerve root enhancement. Differential diagnoses for the arachnoid web/adhesion include idiopathic arachnoiditis, post-inflammatory changes (e.g., prior infection, hemorrhage, surgery), or a congenital anomaly. Clinical correlation is recommended. If clinically indicated, follow-up imaging may be considered to assess stability or progression. SOFT TISSUES: No paraspinal mass. L1-L2: No significant disc herniation. The spinal canal and neural foramina are widely patent. L2-L3: No significant disc herniation. The spinal canal and neural foramina are widely patent. L3-L4: No  significant disc herniation. The spinal canal and neural foramina are widely patent. L4-L5: No significant disc herniation. The spinal canal and neural foramina are widely patent.  L5-S1: No significant disc herniation. The spinal canal and neural foramina are widely patent. IMPRESSION: 1. 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 Differential considerations include postinflammatory arachnoiditis/adhesions (prior infection, hemorrhage, or surgery), less likely a small arachnoid cyst, and unlikely leptomeningeal/inflammatory disease given lack of enhancement. 2. If symptomatic or if symptoms progress, recommend clinical correlation and neurosurgical consultation; consider follow-up lumbar spine MRI (with and without contrast) to assess for interval change (or sooner for new neurologic deficits). Electronically signed by: Evalene Coho MD 06/14/2024 11:57 AM EST RP Workstation: [id number]   MR Cervical Spine W and Wo Contrast Result Date: 06/14/2024 EXAM: MRI CERVICAL SPINE WITHOUT AND WITH CONTRAST 06/14/2024 11:01:03 AM TECHNIQUE: Multiplanar multisequence MRI of the cervical spine was performed with and without intravenous contrast. COMPARISON: None available. CLINICAL HISTORY: Demyelinating disease. Bilateral foot and hand numbness. . CONTRAST: 10 mL gadobutrol  (GADAVIST ) 1 MMOL/ML injection 10 mL GADOBUTROL  1 MMOL/ML IV SOLN. FINDINGS: BONES AND ALIGNMENT: Normal alignment. Normal vertebral body heights. Bone marrow signal is unremarkable except for edematous endplate marrow changes at C6-C7. SPINAL CORD: Normal spinal cord size. Normal signal is present throughout the cervical and upper thoracic spinal cord to the lowest imaged level, T3. No pathologic enhancement is present. SOFT TISSUES: Fluid is present in the oropharynx and nasopharynx secondary to intubation. No paraspinal mass. C2-C3: No significant disc herniation. No spinal canal stenosis or neural foraminal narrowing. C3-C4: No significant disc herniation. No spinal canal stenosis or neural foraminal narrowing. C4-C5: No significant disc herniation. No spinal  canal stenosis or neural foraminal narrowing. C5-C6: No significant disc herniation. No spinal canal stenosis or neural foraminal narrowing. C6-C7: Edematous endplate marrow changes. Mild disc bulging. Asymmetric left-sided facet uncovertebral spur results in moderate left foraminal stenosis. C7-T1: No significant disc herniation. No spinal canal stenosis or neural foraminal narrowing. IMPRESSION: 1. No evidence of demyelinating disease in the cervical spinal cord. 2. Moderate left foraminal narrowing at C6-7 . SABRA Electronically signed by: Lonni Necessary MD 06/14/2024 11:23 AM EST RP Workstation: [id number]   MR Brain W and Wo Contrast Result Date: 06/14/2024 EXAM: MRI BRAIN WITH AND WITHOUT CONTRAST 06/14/2024 11:00:44 AM TECHNIQUE: Multiplanar multisequence MRI of the head/brain was performed with and without the administration of intravenous contrast. COMPARISON: None available. CLINICAL HISTORY: Neuro deficit, acute, stroke suspected. Progressive numbness in the lower extremities below the knees and bilateral hands over the last 4 months. FINDINGS: BRAIN AND VENTRICLES: No acute infarct. No acute intracranial hemorrhage. No mass effect or midline shift. No hydrocephalus. The sella is unremarkable. Normal flow voids. No mass or abnormal enhancement. ORBITS: No acute abnormality. SINUSES: Moderate mucosal thickening is present in the inferior maxillary sinuses bilaterally. Bilateral mastoid effusions are present. These may be secondary to intubation. BONES AND SOFT TISSUES: Normal bone marrow signal and enhancement. No acute soft tissue abnormality. IMPRESSION: 1. No acute intracranial abnormality. Electronically signed by: Lonni Necessary MD 06/14/2024 11:08 AM EST RP Workstation: [id number]   DG Chest 2 View Result Date: 06/12/2024 EXAM: 2 VIEW(S) XRAY OF THE CHEST 06/12/2024 10:44:00 PM COMPARISON: Comparison with 05/02/2022. CLINICAL HISTORY: Numbness in both legs and hands for 1 month.  Intermittent chest pain and shortness of breath for 2 months. FINDINGS: LUNGS AND PLEURA: Shallow inspiration. Lungs are clear. No pleural effusion. No pneumothorax. HEART  AND MEDIASTINUM: Heart size and pulmonary vascularity are normal. Mediastinal contours appear intact. No acute abnormality of the cardiac and mediastinal silhouettes. BONES AND SOFT TISSUES: No acute osseous abnormality. IMPRESSION: 1. No acute cardiopulmonary pathology. Electronically signed by: Elsie Gravely MD 06/12/2024 11:34 PM EST RP Workstation: [id number]    Microbiology: Results for orders placed or performed during the hospital encounter of 06/05/24  Resp panel by RT-PCR (RSV, Flu A&B, Covid) Anterior Nasal Swab     Status: None   Collection Time: 06/05/24 11:55 PM   Specimen: Anterior Nasal Swab  Result Value Ref Range Status   SARS Coronavirus 2 by RT PCR NEGATIVE NEGATIVE Final   Influenza A by PCR NEGATIVE NEGATIVE Final   Influenza B by PCR NEGATIVE NEGATIVE Final    Comment: (NOTE) The Xpert Xpress SARS-CoV-2/FLU/RSV plus assay is intended as an aid in the diagnosis of influenza from Nasopharyngeal swab specimens and should not be used as a sole basis for treatment. Nasal washings and aspirates are unacceptable for Xpert Xpress SARS-CoV-2/FLU/RSV testing.  Fact Sheet for Patients: bloggercourse.com  Fact Sheet for Healthcare Providers: seriousbroker.it  This test is not yet approved or cleared by the [place]  FDA and has been authorized for detection and/or diagnosis of SARS-CoV-2 by FDA under an Emergency Use Authorization (EUA). This EUA will remain in effect (meaning this test can be used) for the duration of the COVID-19 declaration under Section 564(b)(1) of the Act, 21 U.S.C. section 360bbb-3(b)(1), unless the authorization is terminated or revoked.     Resp Syncytial Virus by PCR NEGATIVE NEGATIVE Final    Comment: (NOTE) Fact Sheet  for Patients: bloggercourse.com  Fact Sheet for Healthcare Providers: seriousbroker.it  This test is not yet approved or cleared by the [place]  FDA and has been authorized for detection and/or diagnosis of SARS-CoV-2 by FDA under an Emergency Use Authorization (EUA). This EUA will remain in effect (meaning this test can be used) for the duration of the COVID-19 declaration under Section 564(b)(1) of the Act, 21 U.S.C. section 360bbb-3(b)(1), unless the authorization is terminated or revoked.  Performed at [facility], [address], [place], [place] [address]     Labs: CBC: Recent Labs  Lab 06/12/24 2304 06/14/24 0641  WBC 6.9 7.2  HGB 13.7 13.0  HCT 40.2 37.2*  MCV 97.6 96.9  PLT 99* 87*   Basic Metabolic Panel: Recent Labs  Lab 06/12/24 2304 06/13/24 0049 06/14/24 0641 06/15/24 0157  NA 143  --  138  --   K 3.0*  --  4.0  --   CL 96*  --  98  --   CO2 21*  --  30  --   GLUCOSE 103*  --  101*  --   BUN 6  --  7  --   CREATININE 0.50*  --  0.68  --   CALCIUM 9.2  --  8.9  --   MG  --  1.6*  --  2.1   Liver Function Tests: Recent Labs  Lab 06/14/24 0641  AST 95*  ALT 71*  ALKPHOS 72  BILITOT 1.5*  PROT 6.9  ALBUMIN 4.0   CBG: No results for input(s): GLUCAP in the last 168 hours.  Discharge time spent: greater than 30 minutes.  Signed: Sabas GORMAN Brod, MD [facility] 06/15/2024 "

## 2024-06-15 NOTE — Progress Notes (Signed)
 S: No change in sensory sx  O:  Vitals:   06/14/24 2334 06/15/24 0350  BP: (!) 149/104 (!) 137/97  Pulse: 83 75  Resp: 17 17  Temp: 98.6 F (37 C) 98.8 F (37.1 C)  SpO2: 100% 98%   General: Laying comfortably in bed; in no acute distress.  HENT: Normal oropharynx and mucosa. Normal external appearance of ears and nose.  Neck: Supple, no pain or tenderness  CV: No JVD. No peripheral edema.  Pulmonary: Symmetric Chest rise. Normal respiratory effort.  Abdomen: Soft to touch, non-tender.  Ext: No cyanosis, edema, or deformity  Skin: No rash. Normal palpation of skin.   Musculoskeletal: Normal digits and nails by inspection. No clubbing.    Neurologic Examination Mental status/Cognition: Alert, oriented to self, place, month and year, good attention.  Speech/language: Fluent, comprehension intact, object naming intact, repetition intact.  Cranial nerves:   CN II Pupils equal and reactive to light, no VF deficits   CN III,IV,VI EOM intact, no gaze preference or deviation, no nystagmus   CN V normal sensation in V1, V2, and V3 segments bilaterally   CN VII no asymmetry, no nasolabial fold flattening   CN VIII normal hearing to speech   CN IX & X normal palatal elevation, no uvular deviation   CN XI 5/5 head turn and 5/5 shoulder shrug bilaterally   CN XII midline tongue protrusion    Motor:  Muscle bulk: normal, tone normal, pronator drift none tremor none Mvmt Root Nerve  Muscle Right Left Comments SA C5/6 Ax Deltoid 5 5   EF C5/6 Mc Biceps 5 5   EE C6/7/8 Rad Triceps 5 5   WF C6/7 Med FCR       WE C7/8 PIN ECU       F Ab C8/T1 U ADM/FDI 4+ 4+   HF L1/2/3 Fem Illopsoas 5 5   KE L2/3/4 Fem Quad 5 5   DF L4/5 D Peron Tib Ant 5 5   PF S1/2 Tibial Grc/Sol 5 5     Reflexes:   Right Left Comments Pectoralis        Biceps (C5/6) 2 2   Brachioradialis (C5/6) 1 1    Triceps (C6/7) 2 2    Patellar (L3/4) 2 2    Achilles (S1) 1 1    Hoffman         Plantar withdraws withdraws   Jaw jerk      Sensation:  Light touch Feels intact to light touch  Pin prick Intact in all extremities.  Temperature    Vibration Decreased in BL big toes and finger tips. Proprioception Absent in BL big toes.   Coordination/Complex Motor:  - Finger to Nose intact BL - Heel to shin with with slight ataxia. - Rapid alternating movement are intact BL - Gait: deferred.  Data:  MRI brain wwo No acute process  MRI c spine wwo 1. No evidence of demyelinating disease in the cervical spinal cord. 2. Moderate left foraminal narrowing at C6-7 .  MRI t spine wwo 1. No evidence of demyelinating disease in the thoracic spinal cord. 2. Irregular dependent reticular and streaky opacities in both lungs, favored atelectasis; correlate clinically and consider chest imaging if indicated.  MRI l spine wwo 1. 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 Differential considerations include postinflammatory arachnoiditis/adhesions (prior infection, hemorrhage, or surgery), less likely a small arachnoid cyst, and unlikely leptomeningeal/inflammatory disease given lack  of enhancement. 2. If symptomatic or if symptoms progress, recommend clinical correlation and neurosurgical consultation; consider follow-up lumbar spine MRI (with and without contrast) to assess for interval change (or sooner for new neurologic deficits).  A/P; 1 month of gradually progressive numbness in BL lowers from Knee and below and now numbness in BL uppers from hands and below. Drinks EtOH daily.   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  Elida Ross, MD [facility] [phone]  If 7pm- 7am, please page neurology on call as listed in AMION.

## 2024-06-16 LAB — METHYLMALONIC ACID, SERUM: Methylmalonic Acid, Quantitative: 67 nmol/L (ref 0–378)

## 2024-06-16 LAB — VITAMIN B1: Vitamin B1 (Thiamine): 137 nmol/L (ref 66.5–200.0)

## 2024-06-16 LAB — HOMOCYSTEINE: Homocysteine: 28.8 umol/L — ABNORMAL HIGH (ref 0.0–14.5)

## 2024-06-18 ENCOUNTER — Ambulatory Visit: Attending: Family Medicine | Admitting: Physical Therapy

## 2024-06-18 NOTE — Therapy (Incomplete)
 " OUTPATIENT PHYSICAL THERAPY NEURO EVALUATION   Patient Name: Edwin [name] MRN: [id number] DOB:1980-11-05, 44 y.o., male Today's Date: 06/18/2024   PCP: No PCP per chart  REFERRING PROVIDER: Drusilla Sabas RAMAN, MD  END OF SESSION:   Past Medical History:  Diagnosis Date   Alcohol abuse    Hypertension    Tobacco abuse    Past Surgical History:  Procedure Laterality Date   HERNIA REPAIR     RADIOLOGY WITH ANESTHESIA N/A 06/14/2024   Procedure: MRI WITH ANESTHESIA;  Surgeon: Radiologist, Medication, MD;  Location: [facility] OR;  Service: Radiology;  Laterality: N/A;   Patient Active Problem List   Diagnosis Date Noted   Neurovascular deficit 06/13/2024   Nonadherence to medical treatment 11/28/2022   Left inguinal hernia 11/28/2022   Atrial fibrillation (HCC) 03/26/2022   Hyperlipidemia 07/06/2020   Vitamin D  deficiency 10/13/2019   Essential hypertension    Tobacco abuse    ETOH abuse     ONSET DATE: 06/15/2024  REFERRING DIAG: R20.2 (ICD-10-CM) - Paresthesia R29.898 (ICD-10-CM) - Weakness of both lower extremities  THERAPY DIAG:  No diagnosis found.  Rationale for Evaluation and Treatment: {HABREHAB:27488}  SUBJECTIVE:                                                                                                                                                                                             SUBJECTIVE STATEMENT: *** Pt accompanied by: {accompnied:27141}  PERTINENT HISTORY: Presented to [facility] hospital on 06/12/2024 with with several months of bilateral hand and feet numbness, generalized weakness and imbalance. PMH includes HTN and alcohol abuse.   PAIN:  Are you having pain? {OPRCPAIN:27236}  PRECAUTIONS: {Therapy precautions:24002}  RED FLAGS: {PT Red Flags:29287}   WEIGHT BEARING RESTRICTIONS: {Yes ***/No:24003}  FALLS: Has patient fallen in last 6 months? {fallsyesno:27318}  LIVING ENVIRONMENT: Lives with: {OPRC lives with:25569::lives with their  family} Lives in: {Lives in:25570} Stairs: {opstairs:27293} Has following equipment at home: {Assistive devices:23999}  PLOF: {PLOF:24004}  PATIENT GOALS: ***  OBJECTIVE:  Note: Objective measures were completed at Evaluation unless otherwise noted.  DIAGNOSTIC FINDINGS: per D/C note from hospitalist on 06/15/24: MRI l spine wwo 1. 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 Differential considerations include postinflammatory arachnoiditis/adhesions (prior infection, hemorrhage, or surgery), less likely a small arachnoid cyst, and unlikely leptomeningeal/inflammatory disease given lack of enhancement.  MRI brain 06/13/24: IMPRESSION: 1. No acute intracranial abnormality.  MRI cervical spine 06/14/24: IMPRESSION: 1. No evidence of demyelinating disease in the cervical spinal cord. 2. Moderate left foraminal narrowing at C6-7 .  COGNITION: Overall  cognitive status: {cognition:24006}   SENSATION: {sensation:27233}  COORDINATION: ***  EDEMA:  {edema:24020}  MUSCLE TONE: {LE tone:25568}  MUSCLE LENGTH: Hamstrings: Right *** deg; Left *** deg Debby test: Right *** deg; Left *** deg  DTRs:  {DTR SITE:24025}  POSTURE: {posture:25561}  LOWER EXTREMITY ROM:     {AROM/PROM:27142}  Right Eval Left Eval  Hip flexion    Hip extension    Hip abduction    Hip adduction    Hip internal rotation    Hip external rotation    Knee flexion    Knee extension    Ankle dorsiflexion    Ankle plantarflexion    Ankle inversion    Ankle eversion     (Blank rows = not tested)  LOWER EXTREMITY MMT:    MMT Right Eval Left Eval  Hip flexion    Hip extension    Hip abduction    Hip adduction    Hip internal rotation    Hip external rotation    Knee flexion    Knee extension    Ankle dorsiflexion    Ankle plantarflexion    Ankle inversion    Ankle eversion    (Blank rows = not tested)  BED MOBILITY:   {bed mobility:32615:p}  TRANSFERS: {transfers eval:32620}  RAMP:  {ramp eval:32616}  CURB:  {curb eval:32617}  STAIRS: {stairs eval:32618} GAIT: Findings: {GaitneuroPT:32644::Distance walked: ***,Comments: ***}  FUNCTIONAL TESTS:  {Functional tests:24029}  PATIENT SURVEYS:  {rehab surveys:24030}                                                                                                                              TREATMENT DATE: ***    PATIENT EDUCATION: Education details: *** Person educated: {Person educated:25204} Education method: {Education Method:25205} Education comprehension: {Education Comprehension:25206}  HOME EXERCISE PROGRAM: ***  GOALS: Goals reviewed with patient? {yes/no:20286}  SHORT TERM GOALS: Target date: ***  *** Baseline: Goal status: INITIAL  2.  *** Baseline:  Goal status: INITIAL  3.  *** Baseline:  Goal status: INITIAL  4.  *** Baseline:  Goal status: INITIAL  5.  *** Baseline:  Goal status: INITIAL  6.  *** Baseline:  Goal status: INITIAL  LONG TERM GOALS: Target date: ***  *** Baseline:  Goal status: INITIAL  2.  *** Baseline:  Goal status: INITIAL  3.  *** Baseline:  Goal status: INITIAL  4.  *** Baseline:  Goal status: INITIAL  5.  *** Baseline:  Goal status: INITIAL  6.  *** Baseline:  Goal status: INITIAL  ASSESSMENT:  CLINICAL IMPRESSION: Patient is a *** y.o. *** who was seen today for physical therapy evaluation and treatment for ***.   OBJECTIVE IMPAIRMENTS: {opptimpairments:25111}.   ACTIVITY LIMITATIONS: {activitylimitations:27494}  PARTICIPATION LIMITATIONS: {participationrestrictions:25113}  PERSONAL FACTORS: {Personal factors:25162} are also affecting patient's functional outcome.   REHAB POTENTIAL: {rehabpotential:25112}  CLINICAL DECISION MAKING: {clinical decision making:25114}  EVALUATION COMPLEXITY: {Evaluation complexity:25115}  PLAN:  PT FREQUENCY:  {rehab  frequency:25116}  PT DURATION: {rehab duration:25117}  PLANNED INTERVENTIONS: {rehab planned interventions:25118::97110-Therapeutic exercises,97530- Therapeutic [phone]- Neuromuscular re-education,97535- Self Rjmz,02859- Manual therapy,Patient/Family education}  PLAN FOR NEXT SESSION: ***   Sheffield LOISE Senate, PT, DPT 06/18/2024, 8:17 AM        "

## 2024-06-19 ENCOUNTER — Ambulatory Visit: Admitting: Physical Therapy

## 2024-06-19 ENCOUNTER — Encounter: Payer: Self-pay | Admitting: Physical Therapy

## 2024-06-19 VITALS — BP 162/109

## 2024-06-19 DIAGNOSIS — R2681 Unsteadiness on feet: Secondary | ICD-10-CM

## 2024-06-19 NOTE — Therapy (Signed)
 " OUTPATIENT PHYSICAL THERAPY NEURO EVALUATION -ARRIVED NO CHARGE   Patient Name: Edwin [name] MRN: [id number] DOB:02/21/1981, 44 y.o., male Today's Date: 06/19/2024   PCP: No PCP per chart  REFERRING PROVIDER: Drusilla Sabas RAMAN, MD  END OF SESSION:  PT End of Session - 06/19/24 1322     Visit Number 1   arrived no charge   PT Start Time 1320    PT Stop Time 1350    PT Time Calculation (min) 30 min    Activity Tolerance --   limited by elevated BP   Behavior During Therapy WFL for tasks assessed/performed          Past Medical History:  Diagnosis Date   Alcohol abuse    Hypertension    Tobacco abuse    Past Surgical History:  Procedure Laterality Date   HERNIA REPAIR     RADIOLOGY WITH ANESTHESIA N/A 06/14/2024   Procedure: MRI WITH ANESTHESIA;  Surgeon: Radiologist, Medication, MD;  Location: [facility] OR;  Service: Radiology;  Laterality: N/A;   Patient Active Problem List   Diagnosis Date Noted   Neurovascular deficit 06/13/2024   Nonadherence to medical treatment 11/28/2022   Left inguinal hernia 11/28/2022   Atrial fibrillation (HCC) 03/26/2022   Hyperlipidemia 07/06/2020   Vitamin D  deficiency 10/13/2019   Essential hypertension    Tobacco abuse    ETOH abuse     ONSET DATE: 06/15/2024  REFERRING DIAG: R20.2 (ICD-10-CM) - Paresthesia R29.898 (ICD-10-CM) - Weakness of both lower extremities  THERAPY DIAG:  Unsteadiness on feet  Rationale for Evaluation and Treatment: Rehabilitation  SUBJECTIVE:                                                                                                                                                                                             SUBJECTIVE STATEMENT: Pt states his balance is real off from time to time. Feels like he is going to fall over when leaning forward, states it feels like his mind tells him to walk but his legs won't. Has been going on for about 4 months. He hs numbness in hands and both legs below  the knees. Can feel his feet but it is tingling and tight. States he has high BP and takes the medications he was given in the hospital. Medications were reviewed with pt, pt stated he was only taking 3 of them, not the valsartan , and was unsure if he should be talking valsartan  or pantoprazole . States he noticed his BP changed a lot while he was in the hospital. He works third shift on concrete floors and it will feel like his  legs have weights on them. States he has been trying to slow down on drinking and feels a big difference with energy. He had primary care appt on 06/17/24 and has a follow up on next Tuesday. Has an appt scheduled for the EMG later in the month.  Pt accompanied by: self  PERTINENT HISTORY: Presented to [facility] hospital on 06/12/2024 with with several months of bilateral hand and feet numbness, generalized weakness and imbalance. PMH includes HTN and alcohol abuse.   PAIN:  Are you having pain? Yes: NPRS scale: 7/10 when standing too long, fine now Pain location: back Pain description:  Aggravating factors: standing too long, doing chores Relieving factors: occasionally takes tylenol   PRECAUTIONS: None  RED FLAGS: None   WEIGHT BEARING RESTRICTIONS: No  FALLS: Has patient fallen in last 6 months? No  LIVING ENVIRONMENT: Lives with: lives alone Lives in: House/apartment Stairs: Yes: External: 3rd floor steps; can reach both, has some difficulty with stairs, is trying to move apartments Has following equipment at home: None  PLOF: Independent and Vocation/Vocational requirements: machine operator, on short term disability, trying to go on FMLA  PATIENT GOALS: get better, improve balance, is scared on falling  OBJECTIVE:  Note: Objective measures were completed at Evaluation unless otherwise noted.  DIAGNOSTIC FINDINGS: per D/C note from hospitalist on 06/15/24: MRI l spine wwo 1. 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 Differential considerations include postinflammatory arachnoiditis/adhesions (prior infection, hemorrhage, or surgery), less likely a small arachnoid cyst, and unlikely leptomeningeal/inflammatory disease given lack of enhancement.  MRI brain 06/13/24: IMPRESSION: 1. No acute intracranial abnormality.  MRI cervical spine 06/14/24: IMPRESSION: 1. No evidence of demyelinating disease in the cervical spinal cord. 2. Moderate left foraminal narrowing at C6-7 .   Arrived No Charge: Vitals:   06/19/24 1340  BP: (!) 162/109   Assessed BP and above normal limits for therapy eval. Pt asymptomatic but reports he has not taken his Valsartan  since he was in the hospital. Reviewed medication list with pt and what he should be taking and for pt to ask PCP regarding any further questions about his medications (sees them next week). Pt educated on symptoms of hypertension and BP parameters for therapy, BP related safety with participation in PT, importance of monitoring BP at home, and was advised to go to the ED if he experiences symptoms of elevated BP. Rescheduled eval in a couple weeks after pt takes BP medication as prescribed and sees PCP again.    Sheffield LOISE Senate, PT, DPT 06/19/2024, 1:55 PM        "

## 2024-07-03 ENCOUNTER — Ambulatory Visit: Admitting: Physical Therapy

## 2024-07-03 NOTE — Therapy (Unsigned)
 " OUTPATIENT PHYSICAL THERAPY NEURO EVALUATION -ARRIVED NO CHARGE   Patient Name: Edwin [name] MRN: [id number] DOB:Jan 22, 1981, 44 y.o., male Today's Date: 07/03/2024   PCP: No PCP per chart  REFERRING PROVIDER: Drusilla Sabas RAMAN, MD  END OF SESSION:    Past Medical History:  Diagnosis Date   Alcohol abuse    Hypertension    Tobacco abuse    Past Surgical History:  Procedure Laterality Date   HERNIA REPAIR     RADIOLOGY WITH ANESTHESIA N/A 06/14/2024   Procedure: MRI WITH ANESTHESIA;  Surgeon: Radiologist, Medication, MD;  Location: [facility] OR;  Service: Radiology;  Laterality: N/A;   Patient Active Problem List   Diagnosis Date Noted   Neurovascular deficit 06/13/2024   Nonadherence to medical treatment 11/28/2022   Left inguinal hernia 11/28/2022   Atrial fibrillation (HCC) 03/26/2022   Hyperlipidemia 07/06/2020   Vitamin D  deficiency 10/13/2019   Essential hypertension    Tobacco abuse    ETOH abuse     ONSET DATE: 06/15/2024  REFERRING DIAG: R20.2 (ICD-10-CM) - Paresthesia R29.898 (ICD-10-CM) - Weakness of both lower extremities  THERAPY DIAG:  No diagnosis found.  Rationale for Evaluation and Treatment: Rehabilitation  SUBJECTIVE:                                                                                                                                                                                             SUBJECTIVE STATEMENT: Pt states his balance is real off from time to time. Feels like he is going to fall over when leaning forward, states it feels like his mind tells him to walk but his legs won't. Has been going on for about 4 months. He hs numbness in hands and both legs below the knees. Can feel his feet but it is tingling and tight. States he has high BP and takes the medications he was given in the hospital. Medications were reviewed with pt, pt stated he was only taking 3 of them, not the valsartan , and was unsure if he should be talking  valsartan  or pantoprazole . States he noticed his BP changed a lot while he was in the hospital. He works third shift on concrete floors and it will feel like his legs have weights on them. States he has been trying to slow down on drinking and feels a big difference with energy. He had primary care appt on 06/17/24 and has a follow up on next Tuesday. Has an appt scheduled for the EMG later in the month.  Pt accompanied by: self  PERTINENT HISTORY: Presented to [facility] hospital on 06/12/2024 with with several months of bilateral hand  and feet numbness, generalized weakness and imbalance. PMH includes HTN and alcohol abuse.   PAIN:  Are you having pain? Yes: NPRS scale: 7/10 when standing too long, fine now Pain location: back Pain description:  Aggravating factors: standing too long, doing chores Relieving factors: occasionally takes tylenol   PRECAUTIONS: None  RED FLAGS: None   WEIGHT BEARING RESTRICTIONS: No  FALLS: Has patient fallen in last 6 months? No  LIVING ENVIRONMENT: Lives with: lives alone Lives in: House/apartment Stairs: Yes: External: 3rd floor steps; can reach both, has some difficulty with stairs, is trying to move apartments Has following equipment at home: None  PLOF: Independent and Vocation/Vocational requirements: machine operator, on short term disability, trying to go on FMLA  PATIENT GOALS: get better, improve balance, is scared on falling  OBJECTIVE:  Note: Objective measures were completed at Evaluation unless otherwise noted.  DIAGNOSTIC FINDINGS: per D/C note from hospitalist on 06/15/24: MRI l spine wwo 1. 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 Differential considerations include postinflammatory arachnoiditis/adhesions (prior infection, hemorrhage, or surgery), less likely a small arachnoid cyst, and unlikely leptomeningeal/inflammatory disease given lack of enhancement.  MRI brain  06/13/24: IMPRESSION: 1. No acute intracranial abnormality.  MRI cervical spine 06/14/24: IMPRESSION: 1. No evidence of demyelinating disease in the cervical spinal cord. 2. Moderate left foraminal narrowing at C6-7 .  DIAGNOSTIC FINDINGS: ***  COGNITION: Overall cognitive status: {cognition:24006}   SENSATION: {sensation:27233}  COORDINATION: ***  EDEMA:  {edema:24020}  MUSCLE TONE: {LE tone:25568}  MUSCLE LENGTH: Hamstrings: Right *** deg; Left *** deg Debby test: Right *** deg; Left *** deg  DTRs:  {DTR SITE:24025}  POSTURE: {posture:25561}  LOWER EXTREMITY ROM:     {AROM/PROM:27142}  Right Eval Left Eval  Hip flexion    Hip extension    Hip abduction    Hip adduction    Hip internal rotation    Hip external rotation    Knee flexion    Knee extension    Ankle dorsiflexion    Ankle plantarflexion    Ankle inversion    Ankle eversion     (Blank rows = not tested)  LOWER EXTREMITY MMT:    MMT Right Eval Left Eval  Hip flexion    Hip extension    Hip abduction    Hip adduction    Hip internal rotation    Hip external rotation    Knee flexion    Knee extension    Ankle dorsiflexion    Ankle plantarflexion    Ankle inversion    Ankle eversion    (Blank rows = not tested)  BED MOBILITY:  {bed mobility:32615:p}  TRANSFERS: {transfers eval:32620}  RAMP:  {ramp eval:32616}  CURB:  {curb eval:32617}  STAIRS: {stairs eval:32618} GAIT: Findings: {GaitneuroPT:32644::Distance walked: ***,Comments: ***}  FUNCTIONAL TESTS:  {Functional tests:24029}  PATIENT SURVEYS:  {rehab surveys:24030}  TREATMENT DATE: ***    PATIENT EDUCATION: Education details: *** Person educated: {Person educated:25204} Education method: {Education Method:25205} Education comprehension: {Education Comprehension:25206}  HOME  EXERCISE PROGRAM: ***  GOALS: Goals reviewed with patient? {yes/no:20286}  SHORT TERM GOALS: Target date: ***  *** Baseline: Goal status: INITIAL  2.  *** Baseline:  Goal status: INITIAL  3.  *** Baseline:  Goal status: INITIAL  4.  *** Baseline:  Goal status: INITIAL  5.  *** Baseline:  Goal status: INITIAL  6.  *** Baseline:  Goal status: INITIAL  LONG TERM GOALS: Target date: ***  *** Baseline:  Goal status: INITIAL  2.  *** Baseline:  Goal status: INITIAL  3.  *** Baseline:  Goal status: INITIAL  4.  *** Baseline:  Goal status: INITIAL  5.  *** Baseline:  Goal status: INITIAL  6.  *** Baseline:  Goal status: INITIAL  ASSESSMENT:  CLINICAL IMPRESSION: Patient is a *** y.o. *** who was seen today for physical therapy evaluation and treatment for ***.   OBJECTIVE IMPAIRMENTS: {opptimpairments:25111}.   ACTIVITY LIMITATIONS: {activitylimitations:27494}  PARTICIPATION LIMITATIONS: {participationrestrictions:25113}  PERSONAL FACTORS: {Personal factors:25162} are also affecting patient's functional outcome.   REHAB POTENTIAL: {rehabpotential:25112}  CLINICAL DECISION MAKING: {clinical decision making:25114}  EVALUATION COMPLEXITY: {Evaluation complexity:25115}  PLAN:  PT FREQUENCY: {rehab frequency:25116}  PT DURATION: {rehab duration:25117}  PLANNED INTERVENTIONS: {rehab planned interventions:25118::97110-Therapeutic exercises,97530- Therapeutic [phone]- Neuromuscular re-education,97535- Self Rjmz,02859- Manual therapy,Patient/Family education}  PLAN FOR NEXT SESSION: ***   [place] LOISE Senate, PT 07/03/2024, 11:34 AM        "

## 2024-07-08 ENCOUNTER — Encounter: Payer: Self-pay | Admitting: Neurology

## 2024-07-08 ENCOUNTER — Ambulatory Visit (INDEPENDENT_AMBULATORY_CARE_PROVIDER_SITE_OTHER): Admitting: Neurology

## 2024-07-08 VITALS — BP 135/86 | HR 76 | Ht 71.0 in | Wt 186.4 lb

## 2024-07-08 DIAGNOSIS — I10 Essential (primary) hypertension: Secondary | ICD-10-CM

## 2024-07-08 DIAGNOSIS — R269 Unspecified abnormalities of gait and mobility: Secondary | ICD-10-CM | POA: Diagnosis not present

## 2024-07-08 DIAGNOSIS — R202 Paresthesia of skin: Secondary | ICD-10-CM | POA: Insufficient documentation

## 2024-07-08 MED ORDER — VALSARTAN 80 MG PO TABS: 80.0000 mg | ORAL_TABLET | Freq: Every day | ORAL | 2 refills | Status: AC

## 2024-07-08 NOTE — Addendum Note (Signed)
 Addended by: Shonn Farruggia on: 07/08/2024 02:30 PM   Modules accepted: Orders

## 2024-07-08 NOTE — Progress Notes (Signed)
 "  Chief Complaint  Patient presents with   Follow-up    Pt in room 14. Alone. Here for hospital follow up Internal referral for numbness, tingling, weakness, req NCV/EMG.      ASSESSMENT AND PLAN  Edwin [name] is a 44 y.o. male   Long history of heavy of abuse,  Subacute onset lower extremity paresthesia, gait abnormality  Today's examination showed mild truncal ataxia, wide-based, mild unsteady gait, length-dependent sensory changes, areflexia,  Differentiation diagnosis including Warnicke's encephalopathy, need to rule out demyelinating neuropathy  EMG nerve conduction study  Continue physical therapy  Repeat laboratory evaluation   DIAGNOSTIC DATA (LABS, IMAGING, TESTING) - I reviewed patient records, labs, notes, testing and imaging myself where available.   MEDICAL HISTORY:  Edwin [name], is a 44 year old male, follow-up for hospital discharge for gait abnormality lower extremity paresthesia  History is obtained from the patient and review of electronic medical records. I personally reviewed pertinent available imaging films in PACS.   PMHx of  History of alcohol abuse,  HTN  He had long history of heavy alcohol use, works as a location manager, third shift midnight through 8 AM, 5 days a week, in standing position, lifting up to 50 pounds  Patient is a poor historian, could not provide details of medical history, he lives alone, around summer 2025, he noticed lower extremity numbness from bilateral knee down, also gait abnormality  His gait difficulty gradually getting worse, leading to hospital admission from January 2 through 5, 2026, patient was admitted for several months of bilateral hands and feet numbness generalized weakness, gait abnormality,     Personally reviewed  MRI of the brain with without contrast no acute intracranial abnormality MRI cervical spine with without contrast, moderate left foraminal narrowing C6-7 MRI thoracic with  without, no acute abnormality  MRI of lumbar with without, arachnoid web or adhesion transversing the thecal sac at S2 level with normally distributed nerve roots, 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, there was no contrast-enhancement  Laboratory evaluation: BMP showed low potassium 3.0, hemoglobin of 13.7, decreased platelet 99, alcohol level was 330, normal CPK, B12 2976, normal folic acid , TSH, B1, no M spike, A1c 4.7, negative HIV, RPR, methylmalonic acid level, mild elevation of homocystine 28.8, abnormal liver functional test AST 95, ALT 71  He was referred to physical therapy following hospital discharge, was not able to continue on it due to elevated blood pressure, also started on thiamine  100 mg daily supplement  He is now on medical leave, planning to go back to his job soon  PHYSICAL EXAM:   Vitals:   07/08/24 1326  BP: 135/86  Pulse: 76  SpO2: 98%  Weight: 186 lb 6.4 oz (84.6 kg)  Height: 5' 11 (1.803 m)     Body mass index is 26 kg/m.  PHYSICAL EXAMNIATION:  Gen: NAD, conversant, well nourised, well groomed                     Cardiovascular: Regular rate rhythm, no peripheral edema, warm, nontender. Eyes: Conjunctivae clear without exudates or hemorrhage Neck: Supple, no carotid bruits. Pulmonary: Clear to auscultation bilaterally   NEUROLOGICAL EXAM:  MENTAL STATUS: Speech/cognition: Awake, alert, oriented to history taking and casual conversation CRANIAL NERVES: CN II: Visual fields are full to confrontation. Pupils are round equal and briskly reactive to light. CN III, IV, VI: extraocular movement are normal. No ptosis. CN V: Facial sensation is intact to light touch CN VII: Face is symmetric with normal  eye closure  CN VIII: Hearing is normal to causal conversation. CN IX, X: Phonation is normal. CN XI: Head turning and shoulder shrug are intact  MOTOR: There is no pronator drift of out-stretched arms. Muscle  bulk and tone are normal. Muscle strength is normal.  REFLEXES: Areflexia  SENSORY: Length-dependent light touch pinprick to mid shin level, decreased toe proprioception, and vibratory sensation  COORDINATION: He has mild trunk ataxia, there was no significant limb dysmetria noted.  GAIT/STANCE: Able to get up from seated position arm crossed, wide-based, cautious, mild Romberg sign  REVIEW OF SYSTEMS:  Full 14 system review of systems performed and notable only for as above All other review of systems were negative.   ALLERGIES: Allergies[1]  HOME MEDICATIONS: Current Outpatient Medications  Medication Sig Dispense Refill   folic acid  (FOLVITE ) 1 MG tablet Take 1 tablet (1 mg total) by mouth daily. 30 tablet 1   nebivolol  (BYSTOLIC ) 10 MG tablet Take 1 tablet (10 mg total) by mouth daily. 30 tablet 2   thiamine  (VITAMIN B1) 100 MG tablet Take 1 tablet (100 mg total) by mouth daily. 30 tablet 0   pantoprazole  (PROTONIX ) 40 MG tablet Take 1 tablet (40 mg total) by mouth daily. (Patient not taking: Reported on 07/08/2024) 90 tablet 1   valsartan  (DIOVAN ) 80 MG tablet Take 1 tablet (80 mg total) by mouth daily. (Patient not taking: Reported on 07/08/2024) 30 tablet 2   No current facility-administered medications for this visit.    PAST MEDICAL HISTORY: Past Medical History:  Diagnosis Date   Alcohol abuse    Hypertension    Tobacco abuse     PAST SURGICAL HISTORY: Past Surgical History:  Procedure Laterality Date   HERNIA REPAIR     RADIOLOGY WITH ANESTHESIA N/A 06/14/2024   Procedure: MRI WITH ANESTHESIA;  Surgeon: Radiologist, Medication, MD;  Location: [facility] OR;  Service: Radiology;  Laterality: N/A;    FAMILY HISTORY: Family History  Problem Relation Age of Onset   Heart failure Mother    Hypertension Mother    Kidney disease Father    Heart failure Father    Hypertension Father     SOCIAL HISTORY: Social History   Socioeconomic History   Marital status: Single     Spouse name: Not on file   Number of children: Not on file   Years of education: Not on file   Highest education level: Not on file  Occupational History   Not on file  Tobacco Use   Smoking status: Every Day    Current packs/day: 1.00    Types: Cigarettes    Passive exposure: Past   Smokeless tobacco: Never  Vaping Use   Vaping status: Never Used  Substance and Sexual Activity   Alcohol use: Yes    Alcohol/week: 15.0 standard drinks of alcohol    Types: 15 Shots of liquor per week    Comment: one fifth gallon liquor every day   Drug use: Yes    Frequency: 2.0 times per week    Types: Marijuana   Sexual activity: Not on file  Other Topics Concern   Not on file  Social History Narrative   Not on file   Social Drivers of Health   Tobacco Use: High Risk (07/08/2024)   Patient History    Smoking Tobacco Use: Every Day    Smokeless Tobacco Use: Never    Passive Exposure: Past  Financial Resource Strain: Low Risk (01/31/2022)   Overall Financial Resource Strain (CARDIA)  Difficulty of Paying Living Expenses: Not very hard  Food Insecurity: No Food Insecurity (06/14/2024)   Epic    Worried About Programme Researcher, Broadcasting/film/video in the Last Year: Never true    Ran Out of Food in the Last Year: Never true  Transportation Needs: No Transportation Needs (06/14/2024)   Epic    Lack of Transportation (Medical): No    Lack of Transportation (Non-Medical): No  Physical Activity: Not on file  Stress: Not on file  Social Connections: Not on file  Intimate Partner Violence: Not At Risk (06/14/2024)   Epic    Fear of Current or Ex-Partner: No    Emotionally Abused: No    Physically Abused: No    Sexually Abused: No  Depression (PHQ2-9): Low Risk (01/03/2023)   Depression (PHQ2-9)    PHQ-2 Score: 0  Alcohol Screen: Medium Risk (01/31/2022)   Alcohol Screen    Last Alcohol Screening Score (AUDIT): 10  Housing: Low Risk (06/14/2024)   Epic    Unable to Pay for Housing in the Last Year: No     Number of Times Moved in the Last Year: 0    Homeless in the Last Year: No  Utilities: Not At Risk (06/14/2024)   Epic    Threatened with loss of utilities: No  Health Literacy: Not on file      Modena Callander, M.D. Ph.D.  [facility] [address] [place], [place] [address] Ph: [phone] Fax: [phone]  CC:  Drusilla Sabas RAMAN, MD [address] [place],  [place] [address]  Patient, No Pcp Per       [1] No Known Allergies  "

## 2024-07-11 LAB — COMPREHENSIVE METABOLIC PANEL WITH GFR
ALT: 19 [IU]/L (ref 0–44)
AST: 32 [IU]/L (ref 0–40)
Albumin: 4.6 g/dL (ref 4.1–5.1)
Alkaline Phosphatase: 61 [IU]/L (ref 47–123)
BUN/Creatinine Ratio: 9 (ref 9–20)
BUN: 5 mg/dL — ABNORMAL LOW (ref 6–24)
Bilirubin Total: 0.4 mg/dL (ref 0.0–1.2)
CO2: 26 mmol/L (ref 20–29)
Calcium: 9.6 mg/dL (ref 8.7–10.2)
Chloride: 103 mmol/L (ref 96–106)
Creatinine, Ser: 0.55 mg/dL — ABNORMAL LOW (ref 0.76–1.27)
Globulin, Total: 2.6 g/dL (ref 1.5–4.5)
Glucose: 82 mg/dL (ref 70–99)
Potassium: 3.9 mmol/L (ref 3.5–5.2)
Sodium: 144 mmol/L (ref 134–144)
Total Protein: 7.2 g/dL (ref 6.0–8.5)
eGFR: 128 mL/min/{1.73_m2}

## 2024-07-11 LAB — ETHANOL: Ethanol: 0.179 %

## 2024-07-13 ENCOUNTER — Ambulatory Visit: Payer: Self-pay | Admitting: Neurology

## 2024-07-14 ENCOUNTER — Other Ambulatory Visit: Payer: Self-pay

## 2024-07-14 ENCOUNTER — Telehealth: Payer: Self-pay | Admitting: Neurology

## 2024-07-14 DIAGNOSIS — Z0289 Encounter for other administrative examinations: Secondary | ICD-10-CM

## 2024-07-14 NOTE — Telephone Encounter (Signed)
 Pt called to request letter for him to return to  work tomorrow night .

## 2024-07-15 LAB — HAV, HBV PANEL
Hep B Core Total Ab: NEGATIVE
Hep B Surface Ab, Qual: NONREACTIVE
Hepatitis B Surface Ag: NEGATIVE
hep A Total Ab: NEGATIVE

## 2024-07-15 LAB — HEPATITIS C ANTIBODY: Hep C Virus Ab: NONREACTIVE

## 2024-09-23 ENCOUNTER — Encounter: Admitting: Neurology

## 2024-09-28 ENCOUNTER — Encounter: Admitting: Family Medicine
